# Patient Record
Sex: Female | Born: 1960 | Race: White | Hispanic: No | Smoking: Current every day smoker
Health system: Southern US, Community
[De-identification: ages and names within clinical notes are randomized; demographics above are authoritative.]

## PROBLEM LIST (undated history)

## (undated) ENCOUNTER — Inpatient Hospital Stay: Admission: EM | Payer: Self-pay

## (undated) DIAGNOSIS — K219 Gastro-esophageal reflux disease without esophagitis: Secondary | ICD-10-CM

## (undated) DIAGNOSIS — Z860100 Personal history of colon polyps, unspecified: Secondary | ICD-10-CM

## (undated) DIAGNOSIS — E079 Disorder of thyroid, unspecified: Secondary | ICD-10-CM

## (undated) DIAGNOSIS — R17 Unspecified jaundice: Secondary | ICD-10-CM

## (undated) DIAGNOSIS — M1990 Unspecified osteoarthritis, unspecified site: Secondary | ICD-10-CM

## (undated) DIAGNOSIS — L661 Lichen planopilaris, unspecified: Secondary | ICD-10-CM

## (undated) DIAGNOSIS — B019 Varicella without complication: Secondary | ICD-10-CM

## (undated) DIAGNOSIS — R519 Headache, unspecified: Secondary | ICD-10-CM

## (undated) DIAGNOSIS — T7840XA Allergy, unspecified, initial encounter: Secondary | ICD-10-CM

## (undated) DIAGNOSIS — F419 Anxiety disorder, unspecified: Secondary | ICD-10-CM

## (undated) DIAGNOSIS — R32 Unspecified urinary incontinence: Secondary | ICD-10-CM

## (undated) DIAGNOSIS — K5792 Diverticulitis of intestine, part unspecified, without perforation or abscess without bleeding: Secondary | ICD-10-CM

## (undated) DIAGNOSIS — F32A Depression, unspecified: Secondary | ICD-10-CM

## (undated) DIAGNOSIS — J439 Emphysema, unspecified: Secondary | ICD-10-CM

## (undated) HISTORY — DX: Depression, unspecified: F32.A

## (undated) HISTORY — DX: Gastro-esophageal reflux disease without esophagitis: K21.9

## (undated) HISTORY — DX: Varicella without complication: B01.9

## (undated) HISTORY — DX: Unspecified jaundice: R17

## (undated) HISTORY — DX: Anxiety disorder, unspecified: F41.9

## (undated) HISTORY — DX: Headache, unspecified: R51.9

## (undated) HISTORY — DX: Lichen planopilaris, unspecified: L66.10

## (undated) HISTORY — DX: Personal history of colon polyps, unspecified: Z86.0100

## (undated) HISTORY — DX: Allergy, unspecified, initial encounter: T78.40XA

## (undated) HISTORY — DX: Unspecified urinary incontinence: R32

## (undated) HISTORY — DX: Diverticulitis of intestine, part unspecified, without perforation or abscess without bleeding: K57.92

## (undated) HISTORY — DX: Emphysema, unspecified: J43.9

## (undated) HISTORY — DX: Unspecified osteoarthritis, unspecified site: M19.90

## (undated) HISTORY — DX: Lichen planopilaris: L66.1

## (undated) HISTORY — DX: Personal history of colonic polyps: Z86.010

## (undated) HISTORY — DX: Disorder of thyroid, unspecified: E07.9

---

## 2013-08-09 HISTORY — PX: COLONOSCOPY: SHX174

## 2013-08-09 HISTORY — PX: ESOPHAGOGASTRODUODENOSCOPY: SHX1529

## 2016-12-02 HISTORY — PX: COLONOSCOPY: SHX174

## 2020-02-05 DIAGNOSIS — M5430 Sciatica, unspecified side: Secondary | ICD-10-CM | POA: Insufficient documentation

## 2020-02-05 DIAGNOSIS — M1990 Unspecified osteoarthritis, unspecified site: Secondary | ICD-10-CM | POA: Insufficient documentation

## 2020-06-04 DIAGNOSIS — L661 Lichen planopilaris, unspecified: Secondary | ICD-10-CM | POA: Insufficient documentation

## 2020-11-29 ENCOUNTER — Telehealth: Payer: Self-pay | Admitting: Family Medicine

## 2020-11-29 NOTE — Telephone Encounter (Signed)
Patient wants to become a new patient of [name], she was informed that [name] is not taking new patients at the moment. She states Dr. Patsy Lager agreed to take her on as a patient during a OV with her mom Amanda Maynard. Please advice.

## 2020-11-29 NOTE — Telephone Encounter (Signed)
Are you okay with accepting this pt?

## 2020-11-30 NOTE — Telephone Encounter (Signed)
Okay to accept.

## 2020-12-01 NOTE — Telephone Encounter (Signed)
NP appointment was made.

## 2020-12-12 NOTE — Progress Notes (Addendum)
[facility] [address] [place], [place] [address] [phone]  Date:  12/13/2020   Name:  Amanda Maynard   DOB:  [birth year]   MRN:  [id number]  PCP:  Pearline Cables, MD    Chief Complaint: New Patient (Initial Visit) (Concerns/ questions: hair loss, nail issues, last CPE in June in [facility] with labs- she says was gets lightning feelings since her Levothyroxine dose has been altered. /)   History of Present Illness:  Amanda Maynard is a 60 y.o. very pleasant female patient who presents with the following:  Seen today as a new patient to establish care-daughter of an established patient of mine, Michon Kaczmarek- she moved to this area to take care of her mother.  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  She is considering herself to be retired  She did get a pension and is waiting on her SS right now   She got divorced in the last year as well No children She enjoys gardening, swimming, hiking and outdoor activities   Her thyroid dose was changed over the summer from 82 to 75 mcg- she is not sure if this made any difference in her overall health. She is doing some "natural stuff" as well, and is doing a Engineer, drilling from a nutritionist - " metabolic reset"  She notes she may get some "tingles" like electric shocks on occasion- she first noted these a few years ago, they seem to occur randomly and infrequently.  She wondered if it might have something to do with her thyroid but changing the dose has not made a difference  She was treated for hep C and cured- she was treated about 6 years ago and her titers have  been negative   She has seen hematology in the past for iron def and low platelets - low platelets thought due to her hep c treatment but otherwise benign  Last pap- she is s/p hyst done for fibroids, benign disease.  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  Pt declines flu shot today  Encouraged shingrix   Patient Active Problem List   Diagnosis Date Noted   Hepatitis C antibody test positive 12/13/2020   Hypothyroidism (acquired) 12/13/2020     Past Medical History:  Diagnosis Date   Allergy    Arthritis    Chicken pox    Depression    Diverticulitis    Emphysema of lung (HCC)    GERD (gastroesophageal reflux disease)    Headache    History of colon polyps    Jaundice    Lichen planopilaris    Thyroid disease    Urinary incontinence         Family History  Problem Relation Age of Onset   Heart disease Mother    Hearing loss Mother    Depression Mother    Cancer Mother    Arthritis Mother    Cancer Father    Hyperlipidemia Father    Heart disease  Maternal Grandmother    Cancer Maternal Grandfather    Arthritis Paternal Grandmother    Cancer Paternal Grandfather    Heart attack Paternal Grandfather     Allergies  Allergen Reactions   Molds & Smuts Shortness Of Breath   Sertraline    Sulfa Antibiotics Itching, Nausea Only, Rash and Nausea And Vomiting    Medication list has been reviewed and updated.  Current Outpatient Medications on File Prior to Visit  Medication Sig Dispense Refill   escitalopram (LEXAPRO) 5 mg TABS tablet Every other night.     SYNTHROID 75 MCG tablet Take 75 mcg by mouth daily.     UNABLE TO FIND Med Name: Metabolic Spark. 2 caps daily.     UNABLE TO FIND Med Name: Super Omega. 1 tbls daily.     UNABLE TO FIND Med Name: Metabolic Protein Shake. 1 scoop with water daily.     No current facility-administered medications on file prior to visit.    Review of  Systems:  As per HPI- otherwise negative.   Physical Examination: Vitals:   12/13/20 1119  BP: 110/60  Pulse: 70  Resp: 18  Temp: 97.9 F (36.6 C)  SpO2: 97%   Vitals:   12/13/20 1119  Weight: 195 lb 6.4 oz (88.6 kg)  Height: 5\' 3"  (1.6 m)   Body mass index is 34.61 kg/m. Ideal Body Weight: Weight in (lb) to have BMI = 25: 140.8  GEN: no acute distress.  Obese, otherwise looks well HEENT: Atraumatic, Normocephalic.  Ears and Nose: No external deformity. CV: RRR, No M/G/R. No JVD. No thrill. No extra heart sounds. PULM: CTA B, no wheezes, crackles, rhonchi. No retractions. No resp. distress. No accessory muscle use. ABD: S, NT, ND, +BS. No rebound. No HSM. EXTR: No c/c/e PSYCH: Normally interactive. Conversant.  She has significant hair loss along the frontal portion of her scalp.  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 She had lab work done in in June, this will be abstracted and added to her chart Her thyroid dose was adjusted in June, will check TSH today Order mammogram First dose Shingrix Referral to dermatology for lichen planopilaris And a visit we plan to follow-up for a physical exam in 4 to 6 months  Signed July, MD  Received labs as below, message to patient  Results for orders placed or performed in visit on 12/13/20  TSH  Result Value Ref Range   TSH 0.55 0.35 - 5.50 uIU/mL

## 2020-12-13 ENCOUNTER — Other Ambulatory Visit: Payer: Self-pay

## 2020-12-13 ENCOUNTER — Ambulatory Visit: Payer: BLUE CROSS/BLUE SHIELD | Admitting: Family Medicine

## 2020-12-13 ENCOUNTER — Encounter: Payer: Self-pay | Admitting: Family Medicine

## 2020-12-13 VITALS — BP 110/60 | HR 70 | Temp 97.9°F | Resp 18 | Ht 63.0 in | Wt 195.4 lb

## 2020-12-13 DIAGNOSIS — L661 Lichen planopilaris: Secondary | ICD-10-CM

## 2020-12-13 DIAGNOSIS — Z1231 Encounter for screening mammogram for malignant neoplasm of breast: Secondary | ICD-10-CM

## 2020-12-13 DIAGNOSIS — Z23 Encounter for immunization: Secondary | ICD-10-CM

## 2020-12-13 DIAGNOSIS — R768 Other specified abnormal immunological findings in serum: Secondary | ICD-10-CM | POA: Diagnosis not present

## 2020-12-13 DIAGNOSIS — E039 Hypothyroidism, unspecified: Secondary | ICD-10-CM | POA: Diagnosis not present

## 2020-12-13 LAB — HEPATIC FUNCTION PANEL
ALT: 16 (ref 7–35)
AST: 20 (ref 13–35)
Alkaline Phosphatase: 74 (ref 25–125)
Bilirubin, Total: 1

## 2020-12-13 LAB — BASIC METABOLIC PANEL
BUN: 11 (ref 4–21)
CO2: 24 — AB (ref 13–22)
Chloride: 104 (ref 99–108)
Creatinine: 0.9 (ref 0.5–1.1)
Glucose: 90
Potassium: 4.6 (ref 3.4–5.3)
Sodium: 142 (ref 137–147)

## 2020-12-13 LAB — COMPREHENSIVE METABOLIC PANEL
Albumin: 4.6 (ref 3.5–5.0)
Calcium: 9.5 (ref 8.7–10.7)
GFR calc non Af Amer: 78

## 2020-12-13 LAB — IRON,TIBC AND FERRITIN PANEL
Ferritin: 85
Iron: 73

## 2020-12-13 LAB — LIPID PANEL
Cholesterol: 187 (ref 0–200)
HDL: 49 (ref 35–70)
LDL Cholesterol: 121
Triglycerides: 91 (ref 40–160)

## 2020-12-13 LAB — CBC AND DIFFERENTIAL
HCT: 39 (ref 36–46)
Hemoglobin: 12.9 (ref 12.0–16.0)
Platelets: 92 — AB (ref 150–399)
WBC: 3.6

## 2020-12-13 LAB — TSH
TSH: 0.55 u[IU]/mL (ref 0.35–5.50)
TSH: 1.15 (ref 0.41–5.90)

## 2020-12-13 NOTE — Patient Instructions (Signed)
Good to meet you today! I will be in touch with your thyroid level asap First dose of shingrix given today Perhaps schedule a complete physical with me and we can do your 2nd shingles vaccine in 4-6 months  Mammogram ordered also

## 2021-01-16 ENCOUNTER — Encounter (HOSPITAL_BASED_OUTPATIENT_CLINIC_OR_DEPARTMENT_OTHER): Payer: Self-pay

## 2021-01-16 ENCOUNTER — Other Ambulatory Visit: Payer: Self-pay

## 2021-01-16 ENCOUNTER — Ambulatory Visit (HOSPITAL_BASED_OUTPATIENT_CLINIC_OR_DEPARTMENT_OTHER)
Admission: RE | Admit: 2021-01-16 | Discharge: 2021-01-16 | Disposition: A | Discharge status: Home / Self Care | Payer: BLUE CROSS/BLUE SHIELD | Source: Ambulatory Visit | Attending: Family Medicine | Admitting: Family Medicine

## 2021-01-16 DIAGNOSIS — Z1231 Encounter for screening mammogram for malignant neoplasm of breast: Secondary | ICD-10-CM | POA: Insufficient documentation

## 2021-04-12 ENCOUNTER — Encounter: Payer: BLUE CROSS/BLUE SHIELD | Admitting: Family Medicine

## 2021-04-17 NOTE — Progress Notes (Signed)
[facility] ?[address], [address] ?[place], [place] [address] ?336 [phone] ?Fax [phone] ? ?Date:  04/18/2021  ? ?Name:  Amanda Maynard   DOB:  December 30, 1960   MRN:  [id number] ? ?PCP:  Pearline Cables, MD  ? ? ?Chief Complaint: Annual Exam ? ? ?History of Present Illness: ? ?Amanda Maynard is a 61 y.o. very pleasant female patient who presents with the following: ? ?Patient seen today for physical exam ?Most recent visit with myself was in November to establish care ?She is the daughter of a longer term patient of mine, moved to this area from [facility] to care for her mom ?History of hypothyroidism previous history of hepatitis C status post curative treatment ? ?She is retired from her work in the [employer].  Divorced, no children.  She enjoys the outdoors, gardening and hiking- she is excited to start her garden this year, she plans to build some raised beds  ? ?Tetanus booster- she would like to to today  ?Status post hysterectomy for benign fibroids, can discontinue Pap screening ?Shingles vaccine, can get second dose of Shingrix ?Patient had primary COVID series and has not been interested in more doses ?Colon cancer screening- she did a colonoscopy 3 years ago and has a history of polyps.  She was told she needed a 3 year follow-up ?Pt has noted some GERD - she might like to do an upper GI as well  ? ?Lexapro 5 mg every other day- she actually increased to daily due to some depression symptoms.  She denies any suicidal ideation.  She notes that the daily Lexapro has improved her symptoms, she feels like she is managing okay ?Levothyroxine 75 ? ?Had lab work was abstracted in November, low platelets ?Mammogram is up-to-date ?Bone density ? ?Pt notes she will get "shooting pains" in her lower back which go up to her neck on occasion ?This may occur every couple of weeks ?It lasts for just a moment- she will feel really stiff and like she can't move ?It may last  30 seconds or so ?She is not aware of any injury or other trigger ?Pain is not going down her legs ? ?Currently, she notes diagnosis of lichen planus which is because nail changes and significant hair loss.  A friend of hers who was a Engineer, civil (consulting) in the [employer] suggested using griseofulvin.  I advised her I do not think this will be helpful for lichen planus.  She does have a dermatology appointment coming up in the next 1 to 2 months ?Patient Active Problem List  ? Diagnosis Date Noted  ? Hepatitis C antibody test positive 12/13/2020  ? Hypothyroidism (acquired) 12/13/2020  ? ? ?Past Medical History:  ?Diagnosis Date  ? Allergy   ? Anxiety   ? Arthritis   ? Chicken pox   ? Depression   ? Diverticulitis   ? Emphysema of lung (HCC)   ? GERD (gastroesophageal reflux disease)   ? Headache   ? History of colon polyps   ? Jaundice   ? Lichen planopilaris   ? Thyroid disease   ? Urinary incontinence   ? ? ?Past Surgical History:  ?Procedure Laterality Date  ? ABDOMINAL HYSTERECTOMY  2012  ? APPENDECTOMY  2009  ? ? ?Social History  ? ?Tobacco Use  ? Smoking status: Every Day  ?  Packs/day: 0.50  ?  Years: 30.00  ?  Pack years: 15.00  ?  Types: Cigarettes  ? Tobacco comments:  ?  Have quit twice in 30 years but start again when stress level is too high  ?Substance Use Topics  ? Alcohol use: Yes  ?  Alcohol/week: 6.0 standard drinks  ?  Types: 3 Cans of beer, 3 Standard drinks or equivalent per week  ? Drug use: Never  ? ? ?Family History  ?Problem Relation Age of Onset  ? Breast cancer Mother [id number]  ? Heart disease Mother   ? Hearing loss Mother   ? Depression Mother   ? Cancer Mother   ? Arthritis Mother   ? Cancer Father   ? Hyperlipidemia Father   ? Heart disease Maternal Grandmother   ? Cancer Maternal Grandfather   ? Arthritis Paternal Grandmother   ? Cancer Paternal Grandfather   ? Heart attack Paternal Grandfather   ? ? ?Allergies  ?Allergen Reactions  ? Molds & Smuts Shortness Of Breath  ? Sertraline   ? Sulfa Antibiotics  Itching, Nausea Only, Rash and Nausea And Vomiting  ? ? ?Medication list has been reviewed and updated. ? ?Current Outpatient Medications on File Prior to Visit  ?Medication Sig Dispense Refill  ? escitalopram (LEXAPRO) 5 mg TABS tablet Every other night.    ? SYNTHROID 75 MCG tablet Take 75 mcg by mouth daily.    ? UNABLE TO FIND Med Name: Metabolic Spark. 2 caps daily.    ? UNABLE TO FIND Med Name: Super Omega. 1 tbls daily.    ? UNABLE TO FIND Med Name: Metabolic Protein Shake. 1 scoop with water daily.    ? ?No current facility-administered medications on file prior to visit.  ? ? ?Review of Systems: ? ?As per HPI- otherwise negative. ? ? ?Physical Examination: ?Vitals:  ? 04/18/21 0911  ?BP: 124/78  ?Pulse: 81  ?Resp: (!) 22  ?SpO2: 95%  ? ?Vitals:  ? 04/18/21 0911  ?Weight: 196 lb (88.9 kg)  ?Height: 5\' 3"  (1.6 m)  ? ?Body mass index is 34.72 kg/m?. ?Ideal Body Weight: Weight in (lb) to have BMI = 25: 140.8 ? ?GEN: no acute distress.  Obese, otherwise looks well ?HEENT: Atraumatic, Normocephalic.  Bilateral TM wnl, oropharynx normal.  PEERL,EOMI.   ?Ears and Nose: No external deformity. ?CV: RRR, No M/G/R. No JVD. No thrill. No extra heart sounds. ?PULM: CTA B, no wheezes, crackles, rhonchi. No retractions. No resp. distress. No accessory muscle use. ?ABD: S, NT, ND, +BS. No rebound. No HSM. ?EXTR: No c/c/e ?PSYCH: Normally interactive. Conversant.  ?She has changes of a few fingernails consistent with lichen planus.  Significant hair loss with smooth, shiny scalp on the crown of her head ?Her paraspinous musculature is not tender to palpation at this time ?Assessment and Plan: ?Encounter for annual physical exam ? ?Encounter for screening colonoscopy - Plan: Ambulatory referral to Gastroenterology ? ?Preventative health care - Plan: Ambulatory referral to Gastroenterology ? ?Need for shingles vaccine - Plan: Varicella-zoster vaccine IM (Shingrix) ? ?Physical exam - Plan: Lipid panel ? ?Screening for  hyperlipidemia - Plan: Lipid panel ? ?Hypothyroidism (acquired) - Plan: TSH ? ?Screening for diabetes mellitus - Plan: Comprehensive metabolic panel, Hemoglobin A1c ? ?Midline low back pain without sciatica, unspecified chronicity - Plan: DG Lumbar Spine Complete ? ?Screening, deficiency anemia, iron - Plan: CBC ? ?Immunization due - Plan: Tdap vaccine greater than or equal to 7yo IM ? ?Physical exam today.  Encouraged healthy diet and exercise routine ?Will plan further follow- up pending labs. ? ?Give second  dose of Shingrix, tetanus shot today ?We will obtain lumbar spine films for concern ofVery occasional pains in her low back ?Will plan further follow- up pending labs and x-rays. ?Referral to gastroenterology for screening colonoscopy and possibly upper GI ?Signed ?Abbe Amsterdam, MD ? ?Received labs as below, message to pt ? ?Results for orders placed or performed in visit on 04/18/21  ?CBC  ?Result Value Ref Range  ? WBC 3.7 (L) 4.0 - 10.5 K/uL  ? RBC 4.33 3.87 - 5.11 Mil/uL  ? Platelets 82.0 (L) 150.0 - 400.0 K/uL  ? Hemoglobin 12.4 12.0 - 15.0 g/dL  ? HCT 37.4 36.0 - 46.0 %  ? MCV 86.5 78.0 - 100.0 fl  ? MCHC 33.1 30.0 - 36.0 g/dL  ? RDW 14.2 11.5 - 15.5 %  ?Comprehensive metabolic panel  ?Result Value Ref Range  ? Sodium 141 135 - 145 mEq/L  ? Potassium 4.4 3.5 - 5.1 mEq/L  ? Chloride 106 96 - 112 mEq/L  ? CO2 29 19 - 32 mEq/L  ? Glucose, Bld 93 70 - 99 mg/dL  ? BUN 11 6 - 23 mg/dL  ? Creatinine, Ser 0.74 0.40 - 1.20 mg/dL  ? Total Bilirubin 1.0 0.2 - 1.2 mg/dL  ? Alkaline Phosphatase 66 39 - 117 U/L  ? AST 18 0 - 37 U/L  ? ALT 12 0 - 35 U/L  ? Total Protein 6.9 6.0 - 8.3 g/dL  ? Albumin 4.3 3.5 - 5.2 g/dL  ? GFR 87.43 >60.00 mL/min  ? Calcium 9.4 8.4 - 10.5 mg/dL  ?Hemoglobin A1c  ?Result Value Ref Range  ? Hgb A1c MFr Bld 5.3 4.6 - 6.5 %  ?Lipid panel  ?Result Value Ref Range  ? Cholesterol 180 0 - 200 mg/dL  ? Triglycerides 67.0 0.0 - 149.0 mg/dL  ? HDL 53.30 >39.00 mg/dL  ? VLDL 13.4 0.0 - 40.0 mg/dL   ? LDL Cholesterol 114 (H) 0 - 99 mg/dL  ? Total CHOL/HDL Ratio 3   ? NonHDL 126.94   ?TSH  ?Result Value Ref Range  ? TSH 0.95 0.35 - 5.50 uIU/mL  ? ? ? ?

## 2021-04-17 NOTE — Patient Instructions (Addendum)
It was good to see you again today. ?I will be in touch with your labs asap ?Tetanus and 2nd dose of shingrix given today- you may feel a bit achy for a day or so, this is normal.  OTC pain relievers ok!  ? ?Referral placed to GI  ?

## 2021-04-18 ENCOUNTER — Ambulatory Visit (HOSPITAL_BASED_OUTPATIENT_CLINIC_OR_DEPARTMENT_OTHER)
Admission: RE | Admit: 2021-04-18 | Discharge: 2021-04-18 | Disposition: A | Discharge status: Home / Self Care | Payer: BLUE CROSS/BLUE SHIELD | Source: Ambulatory Visit | Attending: Family Medicine | Admitting: Family Medicine

## 2021-04-18 ENCOUNTER — Encounter: Payer: Self-pay | Admitting: Family Medicine

## 2021-04-18 ENCOUNTER — Other Ambulatory Visit: Payer: Self-pay

## 2021-04-18 ENCOUNTER — Ambulatory Visit (INDEPENDENT_AMBULATORY_CARE_PROVIDER_SITE_OTHER): Payer: BLUE CROSS/BLUE SHIELD | Admitting: Family Medicine

## 2021-04-18 VITALS — BP 124/78 | HR 81 | Resp 22 | Ht 63.0 in | Wt 196.0 lb

## 2021-04-18 DIAGNOSIS — Z131 Encounter for screening for diabetes mellitus: Secondary | ICD-10-CM

## 2021-04-18 DIAGNOSIS — Z130 Encounter for screening for diseases of the blood and blood-forming organs and certain disorders involving the immune mechanism: Secondary | ICD-10-CM

## 2021-04-18 DIAGNOSIS — M545 Low back pain, unspecified: Secondary | ICD-10-CM | POA: Diagnosis not present

## 2021-04-18 DIAGNOSIS — Z1211 Encounter for screening for malignant neoplasm of colon: Secondary | ICD-10-CM

## 2021-04-18 DIAGNOSIS — Z0000 Encounter for general adult medical examination without abnormal findings: Secondary | ICD-10-CM | POA: Diagnosis not present

## 2021-04-18 DIAGNOSIS — Z23 Encounter for immunization: Secondary | ICD-10-CM | POA: Diagnosis not present

## 2021-04-18 DIAGNOSIS — E039 Hypothyroidism, unspecified: Secondary | ICD-10-CM | POA: Diagnosis not present

## 2021-04-18 DIAGNOSIS — Z13220 Encounter for screening for lipoid disorders: Secondary | ICD-10-CM | POA: Diagnosis not present

## 2021-04-18 LAB — CBC
HCT: 37.4 % (ref 36.0–46.0)
Hemoglobin: 12.4 g/dL (ref 12.0–15.0)
MCHC: 33.1 g/dL (ref 30.0–36.0)
MCV: 86.5 fl (ref 78.0–100.0)
Platelets: 82 10*3/uL — ABNORMAL LOW (ref 150.0–400.0)
RBC: 4.33 Mil/uL (ref 3.87–5.11)
RDW: 14.2 % (ref 11.5–15.5)
WBC: 3.7 10*3/uL — ABNORMAL LOW (ref 4.0–10.5)

## 2021-04-18 LAB — COMPREHENSIVE METABOLIC PANEL
ALT: 12 U/L (ref 0–35)
AST: 18 U/L (ref 0–37)
Albumin: 4.3 g/dL (ref 3.5–5.2)
Alkaline Phosphatase: 66 U/L (ref 39–117)
BUN: 11 mg/dL (ref 6–23)
CO2: 29 mEq/L (ref 19–32)
Calcium: 9.4 mg/dL (ref 8.4–10.5)
Chloride: 106 mEq/L (ref 96–112)
Creatinine, Ser: 0.74 mg/dL (ref 0.40–1.20)
GFR: 87.43 mL/min (ref >60.00)
Glucose, Bld: 93 mg/dL (ref 70–99)
Potassium: 4.4 mEq/L (ref 3.5–5.1)
Sodium: 141 mEq/L (ref 135–145)
Total Bilirubin: 1 mg/dL (ref 0.2–1.2)
Total Protein: 6.9 g/dL (ref 6.0–8.3)

## 2021-04-18 LAB — TSH: TSH: 0.95 u[IU]/mL (ref 0.35–5.50)

## 2021-04-18 LAB — LIPID PANEL
Cholesterol: 180 mg/dL (ref 0–200)
HDL: 53.3 mg/dL (ref >39.00)
LDL Cholesterol: 114 mg/dL — ABNORMAL HIGH (ref 0–99)
NonHDL: 126.94
Total CHOL/HDL Ratio: 3
Triglycerides: 67 mg/dL (ref 0.0–149.0)
VLDL: 13.4 mg/dL (ref 0.0–40.0)

## 2021-04-18 LAB — HEMOGLOBIN A1C: Hgb A1c MFr Bld: 5.3 % (ref 4.6–6.5)

## 2021-04-20 ENCOUNTER — Encounter: Payer: Self-pay | Admitting: Family Medicine

## 2021-04-23 ENCOUNTER — Other Ambulatory Visit: Payer: Self-pay | Admitting: Family

## 2021-04-23 DIAGNOSIS — D696 Thrombocytopenia, unspecified: Secondary | ICD-10-CM

## 2021-04-24 ENCOUNTER — Inpatient Hospital Stay: Payer: BLUE CROSS/BLUE SHIELD | Admitting: Family

## 2021-04-24 ENCOUNTER — Inpatient Hospital Stay: Payer: BLUE CROSS/BLUE SHIELD | Attending: Hematology & Oncology

## 2021-04-24 ENCOUNTER — Other Ambulatory Visit: Payer: Self-pay

## 2021-04-24 ENCOUNTER — Encounter: Payer: Self-pay | Admitting: Family

## 2021-04-24 VITALS — BP 98/50 | HR 70 | Temp 98.2°F | Resp 18 | Ht 63.0 in | Wt 195.8 lb

## 2021-04-24 DIAGNOSIS — D696 Thrombocytopenia, unspecified: Secondary | ICD-10-CM | POA: Diagnosis not present

## 2021-04-24 DIAGNOSIS — F17210 Nicotine dependence, cigarettes, uncomplicated: Secondary | ICD-10-CM

## 2021-04-24 DIAGNOSIS — Z90710 Acquired absence of both cervix and uterus: Secondary | ICD-10-CM | POA: Diagnosis not present

## 2021-04-24 DIAGNOSIS — D72819 Decreased white blood cell count, unspecified: Secondary | ICD-10-CM | POA: Insufficient documentation

## 2021-04-24 DIAGNOSIS — Z803 Family history of malignant neoplasm of breast: Secondary | ICD-10-CM | POA: Diagnosis not present

## 2021-04-24 LAB — CBC WITH DIFFERENTIAL (CANCER CENTER ONLY)
Abs Immature Granulocytes: 0.01 10*3/uL (ref 0.00–0.07)
Basophils Absolute: 0 10*3/uL (ref 0.0–0.1)
Basophils Relative: 1 %
Eosinophils Absolute: 0.1 10*3/uL (ref 0.0–0.5)
Eosinophils Relative: 3 %
HCT: 39.1 % (ref 36.0–46.0)
Hemoglobin: 12.8 g/dL (ref 12.0–15.0)
Immature Granulocytes: 0 %
Lymphocytes Relative: 21 %
Lymphs Abs: 0.8 10*3/uL (ref 0.7–4.0)
MCH: 28.6 pg (ref 26.0–34.0)
MCHC: 32.7 g/dL (ref 30.0–36.0)
MCV: 87.3 fL (ref 80.0–100.0)
Monocytes Absolute: 0.2 10*3/uL (ref 0.1–1.0)
Monocytes Relative: 6 %
Neutro Abs: 2.7 10*3/uL (ref 1.7–7.7)
Neutrophils Relative %: 69 %
Platelet Count: 99 10*3/uL — ABNORMAL LOW (ref 150–400)
RBC: 4.48 MIL/uL (ref 3.87–5.11)
RDW: 13.8 % (ref 11.5–15.5)
WBC Count: 3.9 10*3/uL — ABNORMAL LOW (ref 4.0–10.5)
nRBC: 0 % (ref 0.0–0.2)

## 2021-04-24 LAB — LACTATE DEHYDROGENASE: LDH: 137 U/L (ref 98–192)

## 2021-04-24 LAB — CMP (CANCER CENTER ONLY)
ALT: 12 U/L (ref 0–44)
AST: 19 U/L (ref 15–41)
Albumin: 4.6 g/dL (ref 3.5–5.0)
Alkaline Phosphatase: 67 U/L (ref 38–126)
Anion gap: 7 (ref 5–15)
BUN: 12 mg/dL (ref 8–23)
CO2: 27 mmol/L (ref 22–32)
Calcium: 10.2 mg/dL (ref 8.9–10.3)
Chloride: 106 mmol/L (ref 98–111)
Creatinine: 0.76 mg/dL (ref 0.44–1.00)
GFR, Estimated: >60 mL/min (ref >60)
Glucose, Bld: 98 mg/dL (ref 70–99)
Potassium: 4.1 mmol/L (ref 3.5–5.1)
Sodium: 140 mmol/L (ref 135–145)
Total Bilirubin: 1.4 mg/dL — ABNORMAL HIGH (ref 0.3–1.2)
Total Protein: 7.7 g/dL (ref 6.5–8.1)

## 2021-04-24 LAB — SAVE SMEAR(SSMR), FOR PROVIDER SLIDE REVIEW

## 2021-04-24 NOTE — Progress Notes (Signed)
Hematology/Oncology Consultation  ? ?Name: Amanda Maynard      MRN: [id number]    Location: Room/bed info not found  Date: 04/24/2021 Time:11:16 AM ? ? ?REFERRING PHYSICIAN: Abbe AmsterdamJessica [name], MD ? ?REASON FOR CONSULT: Thrombocytopenia  ?  ?DIAGNOSIS: Thrombocytopenia  ? ?HISTORY OF PRESENT ILLNESS: Amanda Maynard is a very pleasant 61 yo caucasian female with at least a 3 year known history of mild thrombocytopenia and intermittent mild leukopenia.  ?Platelets today are 99 and WBC count 3.9.  ?She denies any issues with bleeding. No abnormal bruising or petechiae.  ?She is symptomatic with fatigue and dizziness when she first gets up.  ?She has history of Hep C and was treated with Mavyret in 2018. She states that she is now in remission. She does not remember ever having scans of the abdomen to assess her liver and spleen.  ?She states that she drank heavily in her 30-40's. She now has a couple beers, margarita or martini every other day or so.  ?She smokes 1/2 ppd and is slowly weaning down to quit. [place] in [place] doing this with her as well.  ?She has had no issue with recurrent or persistent infections.  ?She and her mother did have Covid in February but have both recuperated from this.  ?No fever, chills, cough, SOB, chest pain, palpitations, abdominal pain or changes in bowel or bladder habits.  ?She has occasional nausea without vomiting and abdominal bloating that comes and goes.  ?She has Lichen Planopilaris that has effected finger and toe nails as well as caused loss of large patches of hair on her head.  ?No personal history of cancer. Her mother had breast, father had soft palate, tongue and lung. Her paternal grandfather had lung and maternal grandfather had metastatic cancer with unknown primary.  ?No history of pregnancy or miscarriage.  ?She has had an appendectomy and hysterectomy without any complications.  ?She has hypothyroidism and is on synthroid.  ?No history of diabetes.  ?No  swelling, tenderness, numbness or tingling in her extremities at this time.  ?No falls or syncope.  ?She has maintained a good appetite and is doing her best to stay well hydrated. Her weight is stable at 195 lbs.  ?She stays quite busy taking care of her mother and spending time with her dog Merlin.  ? ?ROS: All other 10 point review of systems is negative.  ? ?PAST MEDICAL HISTORY:   ?Past Medical History:  ?Diagnosis Date  ? Allergy   ? Anxiety   ? Arthritis   ? Chicken pox   ? Depression   ? Diverticulitis   ? Emphysema of lung (HCC)   ? GERD (gastroesophageal reflux disease)   ? Headache   ? History of colon polyps   ? Jaundice   ? Lichen planopilaris   ? Thyroid disease   ? Urinary incontinence   ? ? ?ALLERGIES: ?Allergies  ?Allergen Reactions  ? Molds & Smuts Shortness Of Breath  ? Sertraline   ? Sulfa Antibiotics Itching, Nausea Only, Rash and Nausea And Vomiting  ? ?   ?MEDICATIONS:  ?Current Outpatient Medications on File Prior to Visit  ?Medication Sig Dispense Refill  ? escitalopram (LEXAPRO) 5 mg TABS tablet Every other night.    ? SYNTHROID 75 MCG tablet Take 75 mcg by mouth daily.    ? UNABLE TO FIND Med Name: Super Omega. 1 tbls daily.    ? UNABLE TO FIND Med Name: Metabolic Protein Shake. 1 scoop with  water daily.    ? UNABLE TO FIND Med Name: Metabolic Spark. 2 caps daily. (Patient not taking: Reported on 04/24/2021)    ? ?No current facility-administered medications on file prior to visit.  ? ?  ?PAST SURGICAL HISTORY ?Past Surgical History:  ?Procedure Laterality Date  ? ABDOMINAL HYSTERECTOMY  2012  ? APPENDECTOMY  2009  ? ? ?FAMILY HISTORY: ?Family History  ?Problem Relation Age of Onset  ? Breast cancer Mother 50  ? Heart disease Mother   ? Hearing loss Mother   ? Depression Mother   ? Cancer Mother   ? Arthritis Mother   ? Cancer Father   ? Hyperlipidemia Father   ? Heart disease Maternal Grandmother   ? Cancer Maternal Grandfather   ? Arthritis Paternal Grandmother   ? Cancer Paternal  Grandfather   ? Heart attack Paternal Grandfather   ? ? ?SOCIAL HISTORY: ? reports that she has been smoking cigarettes. She has a 15.00 pack-year smoking history. She does not have any smokeless tobacco history on file. She reports current alcohol use of about 6.0 standard drinks per week. She reports that she does not use drugs. ? ?PERFORMANCE STATUS: ?The patient's performance status is 1 - Symptomatic but completely ambulatory ? ?PHYSICAL EXAM: ?Most Recent Vital Signs: Blood pressure (!) 98/50, pulse 70, temperature 98.2 ?F (36.8 ?C), temperature source Oral, resp. rate 18, height 5\' 3"  (1.6 m), weight 195 lb 12.8 oz (88.8 kg), SpO2 97 %. ?BP (!) 98/50 (BP Location: Left Arm, Patient Position: Sitting)   Pulse 70   Temp 98.2 ?F (36.8 ?C) (Oral)   Resp 18   Ht 5\' 3"  (1.6 m)   Wt 195 lb 12.8 oz (88.8 kg)   SpO2 97%   BMI 34.68 kg/m?  ? ?General Appearance:    Alert, cooperative, no distress, appears stated age  ?Head:    Normocephalic, without obvious abnormality, atraumatic  ?Eyes:    PERRL, conjunctiva/corneas clear, EOM's intact, fundi  ?  benign, both eyes  ?   ?   ?Throat:   Lips, mucosa, and tongue normal; teeth and gums normal  ?Neck:   Supple, symmetrical, trachea midline, no adenopathy;  ?  thyroid:  no enlargement/tenderness/nodules; no carotid ?  bruit or JVD  ?Back:     Symmetric, no curvature, ROM normal, no CVA tenderness  ?Lungs:     Clear to auscultation bilaterally, respirations unlabored  ?Chest Wall:    No tenderness or deformity  ? Heart:    Regular rate and rhythm, S1 and S2 normal, no murmur, rub   or gallop  ?   ?Abdomen:     Soft, non-tender, bowel sounds active all four quadrants,  ?  no masses, no organomegaly  ?   ?   ?Extremities:   Extremities normal, atraumatic, no cyanosis or edema  ?Pulses:   2+ and symmetric all extremities  ?Skin:   Skin color, texture, turgor normal, no rashes or lesions  ?Lymph nodes:   Cervical, supraclavicular, and axillary nodes normal  ?Neurologic:    CNII-XII intact, normal strength, sensation and reflexes  ?  throughout  ? ? ?LABORATORY DATA:  ?Results for orders placed or performed in visit on 04/24/21 (from the past 48 hour(s))  ?CBC with Differential (Cancer Center Only)     Status: Abnormal  ? Collection Time: 04/24/21 10:47 AM  ?Result Value Ref Range  ? WBC Count 3.9 (L) 4.0 - 10.5 K/uL  ? RBC 4.48 3.87 - 5.11 MIL/uL  ?  Hemoglobin 12.8 12.0 - 15.0 g/dL  ? HCT 39.1 36.0 - 46.0 %  ? MCV 87.3 80.0 - 100.0 fL  ? MCH 28.6 26.0 - 34.0 pg  ? MCHC 32.7 30.0 - 36.0 g/dL  ? RDW 13.8 11.5 - 15.5 %  ? Platelet Count 99 (L) 150 - 400 K/uL  ? nRBC 0.0 0.0 - 0.2 %  ? Neutrophils Relative % 69 %  ? Neutro Abs 2.7 1.7 - 7.7 K/uL  ? Lymphocytes Relative 21 %  ? Lymphs Abs 0.8 0.7 - 4.0 K/uL  ? Monocytes Relative 6 %  ? Monocytes Absolute 0.2 0.1 - 1.0 K/uL  ? Eosinophils Relative 3 %  ? Eosinophils Absolute 0.1 0.0 - 0.5 K/uL  ? Basophils Relative 1 %  ? Basophils Absolute 0.0 0.0 - 0.1 K/uL  ? Immature Granulocytes 0 %  ? Abs Immature Granulocytes 0.01 0.00 - 0.07 K/uL  ?  Comment: Performed at [facility] at [facility], [address] [place], [place] [address]  ?Save Smear [facility])     Status: None  ? Collection Time: 04/24/21 10:47 AM  ?Result Value Ref Range  ? Smear Review SMEAR STAINED AND AVAILABLE FOR REVIEW   ?  Comment: Performed at [facility] at [facility], [address], [place], [place] [address]  ?   ? ?RADIOGRAPHY: ?No results found.   ?  ?PATHOLOGY: None ? ?ASSESSMENT/PLAN: Amanda Maynard is a very pleasant 61 yo caucasian female with at least a 3 year known history of mild thrombocytopenia and intermittent mild leukopenia. She has history of Hep C s/p treatment in 2018 and states that she is now in remission.  ?Blood smear reviewed with Dr. Myna Hidalgo. Platelets are large in size but otherwise unremarkable. WBC and BRC's also appear within normal limits. No evidence of malignancy noted.  ?We will get a  baseline abdominal US to assess her liver and spleen.  ?At this time we will follow-up as needed.  ?We discussed s/s of thrombocytopenia and leukopenia and she verbalized understanding.    ? ?All questions were answere

## 2021-04-25 ENCOUNTER — Telehealth: Payer: Self-pay | Admitting: *Deleted

## 2021-04-25 NOTE — Telephone Encounter (Signed)
Per 04/24/21 los - She will now follow-up as needed ?

## 2021-04-27 ENCOUNTER — Encounter: Payer: Self-pay | Admitting: Gastroenterology

## 2021-04-27 ENCOUNTER — Ambulatory Visit (INDEPENDENT_AMBULATORY_CARE_PROVIDER_SITE_OTHER): Payer: BLUE CROSS/BLUE SHIELD | Admitting: Gastroenterology

## 2021-04-27 ENCOUNTER — Ambulatory Visit: Payer: BLUE CROSS/BLUE SHIELD | Admitting: Gastroenterology

## 2021-04-27 ENCOUNTER — Other Ambulatory Visit: Payer: Self-pay

## 2021-04-27 ENCOUNTER — Other Ambulatory Visit (INDEPENDENT_AMBULATORY_CARE_PROVIDER_SITE_OTHER): Payer: BLUE CROSS/BLUE SHIELD

## 2021-04-27 VITALS — BP 120/70 | HR 72 | Ht 63.0 in | Wt 198.0 lb

## 2021-04-27 DIAGNOSIS — Z8619 Personal history of other infectious and parasitic diseases: Secondary | ICD-10-CM

## 2021-04-27 DIAGNOSIS — Z860100 Personal history of colon polyps, unspecified: Secondary | ICD-10-CM

## 2021-04-27 DIAGNOSIS — R17 Unspecified jaundice: Secondary | ICD-10-CM | POA: Diagnosis not present

## 2021-04-27 DIAGNOSIS — K219 Gastro-esophageal reflux disease without esophagitis: Secondary | ICD-10-CM

## 2021-04-27 MED ORDER — SUTAB 1479-225-188 MG PO TABS: 1.0000 | ORAL_TABLET | ORAL | 0 refills | Status: DC

## 2021-04-27 NOTE — Progress Notes (Signed)
? ? ?          ?Chief Complaint: GERD, history of colon polyps ? ? ?Referring Provider:     Pearline Cables, MD ? ? ?HPI:   ? ? ?Amanda Maynard is a 61 y.o. female with a history of HCV (treated with Mavyret in 2018 with SVR), tobacco use, hypothyroidism, anxiety, depression, emphysema, hysterectomy, appendectomy referred to the Gastroenterology Clinic for evaluation of GERD and colon polyp surveillance.  ? ?Has had GERD for at least 7 years. Index sxs of HB, regurgitation. No dysphagia. Worse with tomato/tomato-based sauces, onions, peppers. Has been trying to control with diet. More recently has been using her mother's Pepcid and OTC Tums prn, using at least 4 days/week. Has never tried daily therapy. EGD in 2016 in [place]. Requested records today.  ? ?Last colonoscopy was approximately 3 years ago in [place] and notable for polyps with recommendation repeat in 3 years.  No record available for review; requested records today. ? ?Recently moved from [facility] to care for her mother. ? ?Follows in the Hematology clinic for mild thrombocytopenia and intermittent mild leukopenia.  Most recent PLT 99 and WBC 3.9 with normal H/H.  T. bili 1.4, otherwise normal liver enzymes and ALP. ? ?History of HCV treated with Mavyret in 2018 with established SVR per patient.  Liver enzymes normal in 2022 and earlier this month (T. bili 1.4). ? ? ?Past Medical History:  ?Diagnosis Date  ? Allergy   ? Anxiety   ? Arthritis   ? Chicken pox   ? Depression   ? Diverticulitis   ? Emphysema of lung (HCC)   ? GERD (gastroesophageal reflux disease)   ? Headache   ? History of colon polyps   ? Jaundice   ? Lichen planopilaris   ? Thyroid disease   ? Urinary incontinence   ? ? ? ?Past Surgical History:  ?Procedure Laterality Date  ? ABDOMINAL HYSTERECTOMY  2012  ? APPENDECTOMY  2009  ? ?Family History  ?Problem Relation Age of Onset  ? Breast cancer Mother 76  ? Heart disease Mother   ? Hearing loss Mother   ? Depression Mother   ?  Cancer Mother   ? Arthritis Mother   ? Cancer Father   ? Hyperlipidemia Father   ? Heart disease Maternal Grandmother   ? Cancer Maternal Grandfather   ? Arthritis Paternal Grandmother   ? Cancer Paternal Grandfather   ? Heart attack Paternal Grandfather   ? ?Social History  ? ?Tobacco Use  ? Smoking status: Every Day  ?  Packs/day: 0.50  ?  Years: 30.00  ?  Pack years: 15.00  ?  Types: Cigarettes  ? Tobacco comments:  ?  Have quit twice in 30 years but start again when stress level is too high  ?Substance Use Topics  ? Alcohol use: Yes  ?  Alcohol/week: 6.0 standard drinks  ?  Types: 3 Cans of beer, 3 Standard drinks or equivalent per week  ? Drug use: Never  ? ?Current Outpatient Medications  ?Medication Sig Dispense Refill  ? escitalopram (LEXAPRO) 5 mg TABS tablet Every other night.    ? SYNTHROID 75 MCG tablet Take 75 mcg by mouth daily.    ? UNABLE TO FIND Med Name: Metabolic Spark. 2 caps daily.    ? UNABLE TO FIND Med Name: Super Omega. 1 tbls daily.    ? UNABLE TO FIND Med Name: Metabolic Protein Shake. 1 scoop with water daily.    ? ?  No current facility-administered medications for this visit.  ? ?Allergies  ?Allergen Reactions  ? Molds & Smuts Shortness Of Breath  ? Sertraline   ? Sulfa Antibiotics Itching, Nausea Only, Rash and Nausea And Vomiting  ? ? ? ?Review of Systems: ?All systems reviewed and negative except where noted in HPI.  ? ? ? ?Physical Exam:   ? ?Wt Readings from Last 3 Encounters:  ?04/27/21 198 lb (89.8 kg)  ?04/24/21 195 lb 12.8 oz (88.8 kg)  ?04/18/21 196 lb (88.9 kg)  ? ? ?BP 120/70   Pulse 72   Ht 5\' 3"  (1.6 m)   Wt 198 lb (89.8 kg)   BMI 35.07 kg/m?  ?Constitutional:  Pleasant, in no acute distress. ?Psychiatric: Normal mood and affect. Behavior is normal. ?Cardiovascular: Normal rate, regular rhythm. No edema ?Pulmonary/chest: Effort normal and breath sounds normal. No wheezing, rales or rhonchi. ?Abdominal: Soft, nondistended, nontender. Bowel sounds active throughout. There  are no masses palpable. No hepatomegaly. ?Neurological: Alert and oriented to person place and time. ?Skin: Skin is warm and dry. No rashes noted. ? ? ?ASSESSMENT AND PLAN;  ? ?1) History of colon polyps ?- Reported history of colon polyps with last colonoscopy in 2020, with recommendation repeat in 3 years ?- Requesting records from previous GI today ?- Repeat colonoscopy for ongoing polyp surveillance per prior recommendations ? ?2) GERD ?- Increasing reflux symptoms, and using her mother's Pepcid more frequently, along with Tums for breakthrough. ?- Discussed role/utility of repeat upper endoscopy vs starting high-dose PPI for diagnostic and therapeutic intent.  She strongly prefers endoscopy first prior to starting daily therapy ?- Schedule EGD ?- Depending on EGD findings, tentative plan for short course of high-dose PPI and titrate to effect ?- Continue antireflux lifestyle/dietary modifications with avoidance of exacerbating foods, avoid overeating, avoid eating close to bedtime ? ?3) Elevated bilirubin ?- Isolated mild elevation in T. bili with otherwise normal liver enzymes and normal ALP ?- Repeat LFT panel with bili fractionization ? ?4) History of HCV ?- Treated with Mavyret in 2018 with reported SVR ?- Recent liver enzymes otherwise normal ?- Requested prior records for review ? ?The indications, risks, and benefits of EGD and colonoscopy were explained to the patient in detail. Risks include but are not limited to bleeding, perforation, adverse reaction to medications, and cardiopulmonary compromise. Sequelae include but are not limited to the possibility of surgery, hospitalization, and mortality. The patient verbalized understanding and wished to proceed. All questions answered, referred to scheduler and bowel prep ordered. Further recommendations pending results of the exam.  ? ? ? ?2019, DO, FACG  04/27/2021, 2:33 PM ? ? ?[name], 04/29/2021, MD ? ?

## 2021-04-27 NOTE — Patient Instructions (Addendum)
If you are age 61 or older, your body mass index should be between 23-30. Your Body mass index is 35.07 kg/m?Marland Kitchen If this is out of the aforementioned range listed, please consider follow up with your Primary Care Provider. ? ?If you are age 68 or younger, your body mass index should be between 19-25. Your Body mass index is 35.07 kg/m?Marland Kitchen If this is out of the aformentioned range listed, please consider follow up with your Primary Care Provider.  ? ?________________________________________________________ ? ?The [facility] providers would like to encourage you to use [facility] to communicate with providers for non-urgent requests or questions.  Due to long hold times on the telephone, sending your provider a message by [facility] may be a faster and more efficient way to get a response.  Please allow 48 business hours for a response.  Please remember that this is for non-urgent requests.  ?_______________________________________________________ ? ?Please go to the lab on the 2nd floor [address] before you leave the office today.  ? ?You have been scheduled for a colonoscopy. Please follow written instructions given to you at your visit today.  ?Please pick up your prep supplies at the pharmacy within the next 1-3 days. ?If you use inhalers (even only as needed), please bring them with you on the day of your procedure.  ? ?We have sent the following medications to your pharmacy for you to pick up at your convenience: ?Sutab ? ?It was a pleasure to see you today! ? ?Doristine Locks, D.O. ? ? ? ? ? ? ?We want to thank you for trusting [facility] with your care. All of our staff and providers value the relationships we have built with our patients, and it is an honor to care for you.  ? ?We are writing to let you know that [facility] [place] will close on Jun 18, 2021, and we invite you to continue to see Dr. Edman Circle and Doristine Locks at the [facility] [place] office  location. We are consolidating our serices at these [facility] practices to better provide care. Our office staff will work with you to ensure a seamless transition.  ? ?Doristine Locks, DO -Dr. Barron Alvine will be movig to [facility] at [address] [place], [place] [address], effective Jun 18, 2021.  Contact [phone] to schedule an appointment with him.  ? ?Edman Circle, MD- Dr. Chales Abrahams will be movig to [facility] at [address] [place], [place] [address], effective Jun 18, 2021.  Contact [phone] to schedule an appointment with him.  ? ?Requesting Medical Records ?If you need to request your medical records, please follow the instructions below. Your medical records are confidential, and a copy can be transferred to another provider or released to you or another person you designate only with your permission. ? ?There are several ways to request your medical records: ?Requests for medical records can be submitted through our practice.   ?You can also request your records electronically, in your MyChart account by selecting the ?Request Health Records? tab.  ?If you need additional information on how to request records, please go to [email or web address], choose Patient Information, then select Request Medical Records. ?To make an appointment or if you have any questions about your health care needs, please contact our office at [phone] and one of our staff members will be glad to assist you. ?[facility] is committed to providing exceptional care for you and our community. Thank you for allowing [place]  to serve your health care needs. ?Sincerely, ? ?Trixie Dredge, Director [facility] ?[facility] also offers convenient virtual care options. Sore throat? Sinus problems? Cold or flu symptoms? Get care from the comfort of home with [facility] Video Visits and e-Visits. Learn more about the non-emergency conditions treated and start your virtual visit at  [email or web address] ? ? ? ? ?

## 2021-04-28 LAB — HEPATIC FUNCTION PANEL
ALT: 14 IU/L (ref 0–32)
AST: 18 IU/L (ref 0–40)
Albumin: 4.7 g/dL (ref 3.8–4.8)
Alkaline Phosphatase: 73 IU/L (ref 44–121)
Bilirubin Total: 0.7 mg/dL (ref 0.0–1.2)
Bilirubin, Direct: 0.21 mg/dL (ref 0.00–0.40)
Total Protein: 7.5 g/dL (ref 6.0–8.5)

## 2021-05-01 ENCOUNTER — Ambulatory Visit (HOSPITAL_BASED_OUTPATIENT_CLINIC_OR_DEPARTMENT_OTHER)
Admission: RE | Admit: 2021-05-01 | Discharge: 2021-05-01 | Disposition: A | Discharge status: Home / Self Care | Payer: BLUE CROSS/BLUE SHIELD | Source: Ambulatory Visit | Attending: Family | Admitting: Family

## 2021-05-01 ENCOUNTER — Encounter: Payer: Self-pay | Admitting: Family

## 2021-05-01 ENCOUNTER — Other Ambulatory Visit: Payer: Self-pay

## 2021-05-01 DIAGNOSIS — R161 Splenomegaly, not elsewhere classified: Secondary | ICD-10-CM | POA: Diagnosis not present

## 2021-05-01 DIAGNOSIS — N281 Cyst of kidney, acquired: Secondary | ICD-10-CM | POA: Diagnosis not present

## 2021-05-01 DIAGNOSIS — K7460 Unspecified cirrhosis of liver: Secondary | ICD-10-CM | POA: Diagnosis not present

## 2021-05-01 DIAGNOSIS — D696 Thrombocytopenia, unspecified: Secondary | ICD-10-CM | POA: Diagnosis not present

## 2021-05-01 DIAGNOSIS — B1920 Unspecified viral hepatitis C without hepatic coma: Secondary | ICD-10-CM | POA: Diagnosis not present

## 2021-05-02 ENCOUNTER — Telehealth: Payer: Self-pay | Admitting: Family

## 2021-05-02 NOTE — Telephone Encounter (Signed)
Left message with call back number to go over recent US results.  ?

## 2021-05-03 ENCOUNTER — Telehealth: Payer: BLUE CROSS/BLUE SHIELD | Admitting: Physician Assistant

## 2021-05-03 ENCOUNTER — Encounter: Payer: Self-pay | Admitting: Family Medicine

## 2021-05-03 ENCOUNTER — Encounter: Payer: Self-pay | Admitting: Family

## 2021-05-03 DIAGNOSIS — J019 Acute sinusitis, unspecified: Secondary | ICD-10-CM | POA: Diagnosis not present

## 2021-05-03 DIAGNOSIS — B9689 Other specified bacterial agents as the cause of diseases classified elsewhere: Secondary | ICD-10-CM

## 2021-05-03 MED ORDER — AMOXICILLIN-POT CLAVULANATE 875-125 MG PO TABS: 1.0000 | ORAL_TABLET | Freq: Two times a day (BID) | ORAL | 0 refills | Status: DC

## 2021-05-03 MED ORDER — PREDNISONE 10 MG (21) PO TBPK: ORAL_TABLET | ORAL | 0 refills | Status: DC

## 2021-05-03 NOTE — Progress Notes (Signed)
E-Visit for Sinus Problems ? ?We are sorry that you are not feeling well.  Here is how we plan to help! ? ?Based on what you have shared with me it looks like you have sinusitis.  Sinusitis is inflammation and infection in the sinus cavities of the head.  Based on your presentation and preceding allergy symptoms and nasal congestion, I believe you most likely have Acute Bacterial Sinusitis.  This is an infection caused by bacteria and is treated with antibiotics. I have prescribed Augmentin 875mg /125mg  one tablet twice daily with food, for 7 days. You may use an oral decongestant such as Mucinex D or if you have glaucoma or high blood pressure use plain Mucinex. Saline nasal spray help and can safely be used as often as needed for congestion.  If you develop worsening sinus pain, fever or notice severe headache and vision changes, or if symptoms are not better after completion of antibiotic, please schedule an appointment with a health care provider.   ? ?I will send in a prednisone tablet pack for you to take as well.  ? ?Sinus infections are not as easily transmitted as other respiratory infection, however we still recommend that you avoid close contact with loved ones, especially the very young and elderly.  Remember to wash your hands thoroughly throughout the day as this is the number one way to prevent the spread of infection! ? ?Home Care: ?Only take medications as instructed by your medical team. ?Complete the entire course of an antibiotic. ?Do not take these medications with alcohol. ?A steam or ultrasonic humidifier can help congestion.  You can place a towel over your head and breathe in the steam from hot water coming from a faucet. ?Avoid close contacts especially the very young and the elderly. ?Cover your mouth when you cough or sneeze. ?Always remember to wash your hands. ? ?Get Help Right Away If: ?You develop worsening fever or sinus pain. ?You develop a severe head ache or visual changes. ?Your  symptoms persist after you have completed your treatment plan. ? ?Make sure you ?Understand these instructions. ?Will watch your condition. ?Will get help right away if you are not doing well or get worse. ? ?Thank you for choosing an e-visit. ? ?Your e-visit answers were reviewed by a board certified advanced clinical practitioner to complete your personal care plan. Depending upon the condition, your plan could have included both over the counter or prescription medications. ? ?Please review your pharmacy choice. Make sure the pharmacy is open so you can pick up prescription now. If there is a problem, you may contact your provider through [employer] and have the prescription routed to another pharmacy.  Your safety is important to [place]. If you have drug allergies check your prescription carefully.  ? ?For the next 24 hours you can use MyChart to ask questions about today's visit, request a non-urgent call back, or ask for a work or school excuse. ?You will get an email in the next two days asking about your experience. I hope that your e-visit has been valuable and will speed your recovery. ? ?

## 2021-05-03 NOTE — Progress Notes (Signed)
I have spent 5 minutes in review of e-visit questionnaire, review and updating patient chart, medical decision making and response to patient.   Ryleeann Urquiza Cody Koralyn Prestage, PA-C    

## 2021-05-04 ENCOUNTER — Telehealth: Payer: Self-pay | Admitting: Family

## 2021-05-04 NOTE — Telephone Encounter (Signed)
I was able to speak with Amanda Maynard and go over recent US results. She did have noted cirrhosis and splenomegaly as well as a left renal cyst. Results routed to PCP and GI.  ?No other questions or concerns at this time. Patient appreciative of call.  ?

## 2021-05-07 ENCOUNTER — Other Ambulatory Visit: Payer: Self-pay

## 2021-05-07 ENCOUNTER — Telehealth: Payer: Self-pay

## 2021-05-07 DIAGNOSIS — K7460 Unspecified cirrhosis of liver: Secondary | ICD-10-CM

## 2021-05-07 DIAGNOSIS — Z8619 Personal history of other infectious and parasitic diseases: Secondary | ICD-10-CM

## 2021-05-07 NOTE — Telephone Encounter (Signed)
-----   Message from [facility] V, DO sent at 05/07/2021  1:15 PM EDT ----- ?Recent RUQ ultrasound Demonstrates heterogenous liver with coarsened echotexture and nodularity suggestive of cirrhosis.  There is mild splenomegaly, but otherwise no ascites and patent portal vein with appropriate flow. ? ?Otherwise recent normal liver enzymes including bilirubin and albumin, along with normal sodium and renal function. ? ?Plan for the following: ? ?- Proceed with EGD as scheduled and will evaluate for esophageal varices and portal hypertensive gastropathy changes at that time ?- Check PT/INR ? ?----- Message ----- ?From: Erenest Blank, NP ?Sent: 05/04/2021   1:09 PM EDT ?To: Shellia Cleverly, DO ? ? ?

## 2021-05-07 NOTE — Telephone Encounter (Signed)
Spoke with pt. Gave pt results and recommendations. Lab order entered. Pt verbalized understanding and stated she would be able to go [place] for lab.  ?

## 2021-05-09 ENCOUNTER — Other Ambulatory Visit (INDEPENDENT_AMBULATORY_CARE_PROVIDER_SITE_OTHER): Payer: BLUE CROSS/BLUE SHIELD

## 2021-05-09 DIAGNOSIS — K7460 Unspecified cirrhosis of liver: Secondary | ICD-10-CM

## 2021-05-09 DIAGNOSIS — Z8619 Personal history of other infectious and parasitic diseases: Secondary | ICD-10-CM | POA: Diagnosis not present

## 2021-05-09 LAB — PROTIME-INR
INR: 1.1 ratio — ABNORMAL HIGH (ref 0.8–1.0)
Prothrombin Time: 11.8 s (ref 9.6–13.1)

## 2021-05-18 ENCOUNTER — Encounter: Payer: Self-pay | Admitting: Gastroenterology

## 2021-05-23 ENCOUNTER — Telehealth: Payer: Self-pay | Admitting: Gastroenterology

## 2021-05-23 NOTE — Telephone Encounter (Signed)
Received records from her previous Gastroenterologist, Dr. Vista Mink, MD, at [facility] in [place], [place] and notable for the following: ? ?- 06/23/2013: Initial appointment in GI clinic for evaluation of chronic RUQ pain (with fried foods) for the last few years.  Also with increased reflux symptoms and episodic nausea.  Also with BRBPR and associated burning/itching.  No previous EGD/colonoscopy at that point.  Since recent ultrasound was reportedly normal (that record not available for review) ?- 08/09/2013: EGD: Mild, grade 1 esophagitis, (path from gastric biopsies benign mucosa, H. pylori negative).  Started on PPI ?- 08/09/2013: Colonoscopy: Moderate diverticulosis throughout, 7 mm villous adenoma removed from proximal sigmoid colon, external hemorrhoids ?- 10/07/2013: HIDA scan: Normal with 80% ejection fraction ?- 10/08/2013: GI follow-up appointment.  Recent HCV positive. 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  BUN/creatinine 6/0.84, sodium 143, potassium 3.7, AST/ALT 66/33, T. bili 1.0, ALP 89.  HCV 142,000, genotype Ia ?- 11/21/2016: CT abdomen: Liver is nodular in contour suggesting cirrhosis.  Slight heterogenous enhancement of the hepatic parenchyma without gross focal lesion.  Unremarkable GB, pancreas.  Splenomegaly.  Diverticulosis without diverticulitis.  No ascites. ?- 12/02/2016: Colonoscopy: moderate diverticulosis throughout the colon.  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  Small nonbleeding external hemorrhoids.  Repeat in 5 years ?- 12/13/2016: WBC 3.7, H/H11.6/36.6, MCV/RDW 82/22.5, PLT 93.  BUN/creatinine 6/0.62, sodium 142, potassium 4.1, AST/ALT 55/22, T. bili 1.2, ALP 96.  HCV negative ? ?- 03/15/2017: Protein 8.0, albumin 4.1, T. bili 1.2, direct bili 0.33, AST/ALT 50/23, ALP 80.  HCV negative. ?- 04/02/2017: CT abdomen/pelvis: Mildly enlarged liver measuring 18 cm.  Nodular contour is again  seen, compatible with cirrhosis.  Normal attenuation.  No liver lesion identified.  Mild contraction of the GB with mild wall thickening of 4 mm.  No pericholecystic fluid, gallstones, sludge.  No duct dilation.  Enlarged spleen measuring 17.3 cm.  A few tiny splenic cysts.  Normal pancreas without PD dilation.  Focal enteritis of the loop of jejunum within the central abdomen and the terminal ileum.  Mild portal hypertension with splenomegaly, enlarged main portal vein, enlarged splenic vein. ?- 08/21/2017: FibroSure: Score 0.55, stage F2 (bridging fibrosis with few septa), activity 0.  Protein 7.3, albumin 4.1, T. bili 0.9, AST/ALT 27/15, ALP 70.  HCV negative ?

## 2021-05-29 DIAGNOSIS — L661 Lichen planopilaris: Secondary | ICD-10-CM | POA: Diagnosis not present

## 2021-05-29 DIAGNOSIS — L433 Subacute (active) lichen planus: Secondary | ICD-10-CM | POA: Diagnosis not present

## 2021-05-29 DIAGNOSIS — L723 Sebaceous cyst: Secondary | ICD-10-CM | POA: Diagnosis not present

## 2021-05-29 DIAGNOSIS — D1801 Hemangioma of skin and subcutaneous tissue: Secondary | ICD-10-CM | POA: Diagnosis not present

## 2021-06-01 ENCOUNTER — Ambulatory Visit (AMBULATORY_SURGERY_CENTER): Payer: BLUE CROSS/BLUE SHIELD | Admitting: Gastroenterology

## 2021-06-01 ENCOUNTER — Encounter: Payer: Self-pay | Admitting: Gastroenterology

## 2021-06-01 VITALS — BP 127/75 | HR 63 | Temp 97.5°F | Resp 21 | Ht 63.0 in | Wt 198.0 lb

## 2021-06-01 DIAGNOSIS — K2970 Gastritis, unspecified, without bleeding: Secondary | ICD-10-CM

## 2021-06-01 DIAGNOSIS — K5730 Diverticulosis of large intestine without perforation or abscess without bleeding: Secondary | ICD-10-CM

## 2021-06-01 DIAGNOSIS — Z86010 Personal history of colonic polyps: Secondary | ICD-10-CM

## 2021-06-01 DIAGNOSIS — K319 Disease of stomach and duodenum, unspecified: Secondary | ICD-10-CM | POA: Diagnosis not present

## 2021-06-01 DIAGNOSIS — R12 Heartburn: Secondary | ICD-10-CM

## 2021-06-01 DIAGNOSIS — K219 Gastro-esophageal reflux disease without esophagitis: Secondary | ICD-10-CM

## 2021-06-01 DIAGNOSIS — K641 Second degree hemorrhoids: Secondary | ICD-10-CM

## 2021-06-01 MED ORDER — SODIUM CHLORIDE 0.9 % IV SOLN: 500.0000 mL | Freq: Once | INTRAVENOUS | Status: DC

## 2021-06-01 MED ORDER — PANTOPRAZOLE SODIUM 40 MG PO TBEC
40.0000 mg | DELAYED_RELEASE_TABLET | Freq: Two times a day (BID) | ORAL | 1 refills | Status: DC
  Filled 2022-02-11: qty 60, 30d supply, fill #0

## 2021-06-01 MED ORDER — SUCRALFATE 1 G PO TABS: 1.0000 g | ORAL_TABLET | Freq: Four times a day (QID) | ORAL | 0 refills | Status: DC

## 2021-06-01 NOTE — Op Note (Signed)
[facility] ?Patient Name: Amanda Maynard ?Procedure Date: 06/01/2021 1:11 PM ?MRN: [id number] ?Endoscopist: Gerrit Heck , MD ?Age: 61 ?Referring MD:  ?Date of Birth: Nov 17, 1960 ?Gender: Female ?Account #: [id number] ?Procedure:                Upper GI endoscopy ?Indications:              Heartburn, Suspected esophageal reflux,  ?                          Regurgitation ?Medicines:                Monitored Anesthesia Care ?Procedure:                Pre-Anesthesia Assessment: ?                          - Prior to the procedure, a History and Physical  ?                          was performed, and patient medications and  ?                          allergies were reviewed. The patient's tolerance of  ?                          previous anesthesia was also reviewed. The risks  ?                          and benefits of the procedure and the sedation  ?                          options and risks were discussed with the patient.  ?                          All questions were answered, and informed consent  ?                          was obtained. Prior Anticoagulants: The patient has  ?                          taken no previous anticoagulant or antiplatelet  ?                          agents. ASA Grade Assessment: II - A patient with  ?                          mild systemic disease. After reviewing the risks  ?                          and benefits, the patient was deemed in  ?                          satisfactory condition to undergo the procedure. ?  After obtaining informed consent, the endoscope was  ?                          passed under direct vision. Throughout the  ?                          procedure, the patient's blood pressure, pulse, and  ?                          oxygen saturations were monitored continuously. The  ?                          Endoscope was introduced through the mouth, and  ?                          advanced to the second part of duodenum. The upper   ?                          GI endoscopy was accomplished without difficulty.  ?                          The patient tolerated the procedure well. ?Scope In: ?Scope Out: ?Findings:                 The examined esophagus was normal. ?                          The Z-line was regular and was found 36 cm from the  ?                          incisors. ?                          Localized moderate inflammation characterized by  ?                          congestion (edema), erosions and erythema was found  ?                          in the gastric antrum. Biopsies were taken with a  ?                          cold forceps for histology. Estimated blood loss  ?                          was minimal. ?                          Localized mild inflammation characterized by  ?                          congestion (edema) and erythema was found in the  ?                          cardia. ?  The examined duodenum was normal. ?Complications:            No immediate complications. ?Estimated Blood Loss:     Estimated blood loss was minimal. ?Impression:               - Normal esophagus. ?                          - Z-line regular, 36 cm from the incisors. ?                          - Gastritis. Biopsied. ?                          - Gastritis. ?                          - Normal examined duodenum. ?Recommendation:           - Patient has a contact number available for  ?                          emergencies. The signs and symptoms of potential  ?                          delayed complications were discussed with the  ?                          patient. Return to normal activities tomorrow.  ?                          Written discharge instructions were provided to the  ?                          patient. ?                          - Resume previous diet. ?                          - Continue present medications. ?                          - Await pathology results. ?                          - Use Protonix  (pantoprazole) 40 mg PO BID for 8  ?                          weeks to promote mucosal healing, then decrease to  ?                          40 mg daily for reflux control and titrate to  ?                          lowest effective dose to control reflux. ?                          -  Use sucralfate tablets 1 gram PO QID for 4 weeks. ?Gerrit Heck, MD ?06/01/2021 2:02:24 PM ?

## 2021-06-01 NOTE — Progress Notes (Signed)
? ?GASTROENTEROLOGY PROCEDURE H&P NOTE  ? ?Primary Care Physician: ?[name], Gwenlyn Found, MD ? ? ? ?Reason for Procedure:   GERD, colon polyps  ? ?Plan:    EGD, Colonsocopy ? ?Patient is appropriate for endoscopic procedure(s) in the ambulatory ([facility]) setting. ? ?The nature of the procedure, as well as the risks, benefits, and alternatives were carefully and thoroughly reviewed with the patient. Ample time for discussion and questions allowed. The patient understood, was satisfied, and agreed to proceed.  ? ? ? ?HPI: ?Amanda Maynard is a 61 y.o. female who presents for EGD and colonsocopy for eval of GERD and colon polyp surveillance.  ? ?Past Medical History:  ?Diagnosis Date  ? Allergy   ? Anxiety   ? Arthritis   ? Chicken pox   ? Depression   ? Diverticulitis   ? Emphysema of lung (HCC)   ? GERD (gastroesophageal reflux disease)   ? Headache   ? History of colon polyps   ? Jaundice   ? Lichen planopilaris   ? Thyroid disease   ? Urinary incontinence   ? ? ?Past Surgical History:  ?Procedure Laterality Date  ? ABDOMINAL HYSTERECTOMY  2012  ? APPENDECTOMY  2009  ? COLONOSCOPY  12/02/2016  ? Dr. Vista Mink. Moderate diverticulosis of the ascending colon, transverse colon, descending colon and sigmoid colon, increase in vascularity of the rectum. External hemorrhoids.  ? COLONOSCOPY  08/09/2013  ? Dr. Vista Mink. Moderate diverticulosis of the hepatic flexure, transverse colon, splenic flexure, descending colon and sigmoid colon. Polyp (6mm) in the proximal sigmoid colon. (polypectomy). External hemorrhoids.  ? ESOPHAGOGASTRODUODENOSCOPY  08/09/2013  ? Dr. Vista Mink. Normal stomach. Normal duodenum. Grade 1 esophagitis was seen in the lower third of the esophagus.  ? ? ?Prior to Admission medications   ?Medication Sig Start Date End Date Taking? Authorizing Provider  ?escitalopram (LEXAPRO) 5 mg TABS tablet Every other night. 08/04/20  Yes [provider]  ?SYNTHROID 75 MCG tablet Take 75 mcg by mouth daily.  11/20/20  Yes [provider]  ?amoxicillin-clavulanate (AUGMENTIN) 875-125 MG tablet Take 1 tablet by mouth 2 (two) times daily. 05/03/21   Waldon Merl, PA-C  ?predniSONE (STERAPRED UNI-PAK 21 TAB) 10 MG (21) TBPK tablet Take following blister pack directions 05/03/21   Waldon Merl, PA-C  ?UNABLE TO FIND Med Name: Metabolic Spark. 2 caps daily.    [provider]  ?UNABLE TO FIND Med Name: Super Omega. 1 tbls daily.    [provider]  ?UNABLE TO FIND Med Name: Metabolic Protein Shake. 1 scoop with water daily.    [provider]  ? ? ?Current Outpatient Medications  ?Medication Sig Dispense Refill  ? escitalopram (LEXAPRO) 5 mg TABS tablet Every other night.    ? SYNTHROID 75 MCG tablet Take 75 mcg by mouth daily.    ? amoxicillin-clavulanate (AUGMENTIN) 875-125 MG tablet Take 1 tablet by mouth 2 (two) times daily. 14 tablet 0  ? predniSONE (STERAPRED UNI-PAK 21 TAB) 10 MG (21) TBPK tablet Take following blister pack directions 21 tablet 0  ? UNABLE TO FIND Med Name: Metabolic Spark. 2 caps daily.    ? UNABLE TO FIND Med Name: Super Omega. 1 tbls daily.    ? UNABLE TO FIND Med Name: Metabolic Protein Shake. 1 scoop with water daily.    ? ?Current Facility-Administered Medications  ?Medication Dose Route Frequency Provider Last Rate Last Admin  ? 0.9 %  sodium chloride infusion  500 mL Intravenous  Once [employer], DO      ? ? ?Allergies as of 06/01/2021 - Review Complete 06/01/2021  ?Allergen Reaction Noted  ? Molds & smuts Shortness Of Breath 12/13/2020  ? Sertraline  12/13/2020  ? Sulfa antibiotics Itching, Nausea Only, Rash, and Nausea And Vomiting 12/13/2020  ? ? ?Family History  ?Problem Relation Age of Onset  ? Breast cancer Mother 62  ? Heart disease Mother   ? Hearing loss Mother   ? Depression Mother   ? Cancer Mother   ? Arthritis Mother   ? Cancer Father   ? Hyperlipidemia Father   ? Heart disease Maternal Grandmother   ? Cancer Maternal Grandfather    ? Arthritis Paternal Grandmother   ? Cancer Paternal Grandfather   ? Heart attack Paternal Grandfather   ? Esophageal cancer Neg Hx   ? Colon cancer Neg Hx   ? Stomach cancer Neg Hx   ? Rectal cancer Neg Hx   ? ? ?Social History  ? ?Socioeconomic History  ? Marital status: Divorced  ?  Spouse name: Not on file  ? Number of children: Not on file  ? Years of education: Not on file  ? Highest education level: Not on file  ?Occupational History  ? Not on file  ?Tobacco Use  ? Smoking status: Every Day  ?  Packs/day: 0.50  ?  Years: 30.00  ?  Pack years: 15.00  ?  Types: Cigarettes  ? Smokeless tobacco: Not on file  ? Tobacco comments:  ?  Have quit twice in 30 years but start again when stress level is too high  ?Substance and Sexual Activity  ? Alcohol use: Yes  ?  Alcohol/week: 6.0 standard drinks  ?  Types: 3 Cans of beer, 3 Standard drinks or equivalent per week  ? Drug use: Never  ? Sexual activity: Not Currently  ?  Birth control/protection: Abstinence, None  ?  Comment: Had hysterectomy and no sex drive since the surgery  ?Other Topics Concern  ? Not on file  ?Social History Narrative  ? Not on file  ? ?Social Determinants of Health  ? ?Financial Resource Strain: Not on file  ?Food Insecurity: Not on file  ?Transportation Needs: Not on file  ?Physical Activity: Not on file  ?Stress: Not on file  ?Social Connections: Not on file  ?Intimate Partner Violence: Not on file  ? ? ?Physical Exam: ?Vital signs in last 24 hours: ?@BP  123/81   Pulse 64   Temp (!) 97.5 ?F (36.4 ?C) (Temporal)   Resp 14   Ht 5\' 3"  (1.6 m)   Wt 198 lb (89.8 kg)   SpO2 97%   BMI 35.07 kg/m?  ?GEN: NAD ?EYE: Sclerae anicteric ?ENT: MMM ?CV: Non-tachycardic ?Pulm: CTA b/l ?GI: Soft, NT/ND ?NEURO:  Alert & Oriented x 3 ? ? ? , DO ?[facility] ? ? ?06/01/2021 1:22 PM ? ?

## 2021-06-01 NOTE — Patient Instructions (Signed)
YOU HAD AN ENDOSCOPIC PROCEDURE TODAY: Refer to the procedure report and other information in the discharge instructions given to you for any specific questions about what was found during the examination. If this information does not answer your questions, please call [place] office at [phone] to clarify.  ° °YOU SHOULD EXPECT: Some feelings of bloating in the abdomen. Passage of more gas than usual. Walking can help get rid of the air that was put into your GI tract during the procedure and reduce the bloating. If you had a lower endoscopy (such as a colonoscopy or flexible sigmoidoscopy) you may notice spotting of blood in your stool or on the toilet paper. Some abdominal soreness may be present for a day or two, also. ° °DIET: Your first meal following the procedure should be a light meal and then it is ok to progress to your normal diet. A half-sandwich or bowl of soup is an example of a good first meal. Heavy or fried foods are harder to digest and may make you feel nauseous or bloated. Drink plenty of fluids but you should avoid alcoholic beverages for 24 hours. If you had a esophageal dilation, please see attached instructions for diet.   ° °ACTIVITY: Your care partner should take you home directly after the procedure. You should plan to take it easy, moving slowly for the rest of the day. You can resume normal activity the day after the procedure however YOU SHOULD NOT DRIVE, use power tools, machinery or perform tasks that involve climbing or major physical exertion for 24 hours (because of the sedation medicines used during the test).  ° °SYMPTOMS TO REPORT IMMEDIATELY: °A gastroenterologist can be reached at any hour. Please call [phone]  for any of the following symptoms:  °Following lower endoscopy (colonoscopy, flexible sigmoidoscopy) °Excessive amounts of blood in the stool  °Significant tenderness, worsening of abdominal pains  °Swelling of the abdomen that is new, acute  °Fever of 100° or  higher  °Following upper endoscopy (EGD, EUS, ERCP, esophageal dilation) °Vomiting of blood or coffee ground material  °New, significant abdominal pain  °New, significant chest pain or pain under the shoulder blades  °Painful or persistently difficult swallowing  °New shortness of breath  °Black, tarry-looking or red, bloody stools ° °FOLLOW UP:  °If any biopsies were taken you will be contacted by phone or by letter within the next 1-3 weeks. Call [phone]  if you have not heard about the biopsies in 3 weeks.  °Please also call with any specific questions about appointments or follow up tests. ° °

## 2021-06-01 NOTE — Progress Notes (Signed)
Report to PACU, RN, vss, BBS= Clear.  

## 2021-06-01 NOTE — Progress Notes (Signed)
Pt's states no medical or surgical changes since previsit or office visit. 

## 2021-06-01 NOTE — Op Note (Signed)
[facility] ?Patient Name: Amanda Maynard ?Procedure Date: 06/01/2021 1:10 PM ?MRN: [id number] ?Endoscopist: Doristine Locks , MD ?Age: 61 ?Referring MD:  ?Date of Birth: [birth year] ?Gender: Female ?Account #: [id number] ?Procedure:                Colonoscopy ?Indications:              High risk colon cancer surveillance: Personal  ?                          history of colonic polyps ?                          ??? 08/09/2013: Colonoscopy: Moderate diverticulosis  ?                          throughout, 7 mm villous adenoma removed from  ?                          proximal sigmoid colon, external hemorrhoids ?                          ??? 12/02/2016: Colonoscopy: moderate diverticulosis  ?                          throughout the colon. 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  ?                          Small nonbleeding external hemorrhoids. Repeat in 5  ?                          years ?Medicines:                Monitored Anesthesia Care ?Procedure:                Pre-Anesthesia Assessment: ?                          - Prior to the procedure, a History and Physical  ?                          was performed, and patient medications and  ?                          allergies were reviewed. The patient's tolerance of  ?                          previous anesthesia was also reviewed. The risks  ?                          and benefits of the procedure and the sedation  ?                          options and risks were discussed with the patient.  ?                          All questions were answered, and informed  consent  ?                          was obtained. Prior Anticoagulants: The patient has  ?                          taken no previous anticoagulant or antiplatelet  ?                          agents. ASA Grade Assessment: II - A patient with  ?                          mild systemic disease. After reviewing the risks  ?                          and benefits, the patient was deemed in  ?                           satisfactory condition to undergo the procedure. ?                          After obtaining informed consent, the colonoscope  ?                          was passed under direct vision. Throughout the  ?                          procedure, the patient's blood pressure, pulse, and  ?                          oxygen saturations were monitored continuously. The  ?                          CF [id number] #[id number] was introduced through the anus  ?                          and advanced to the the terminal ileum. The  ?                          colonoscopy was technically difficult and complex  ?                          due to a tortuous colon. Successful completion of  ?                          the procedure was aided by applying abdominal  ?                          pressure. The patient tolerated the procedure well.  ?                          The quality of the bowel preparation was good. The  ?  terminal ileum, ileocecal valve, appendiceal  ?                          orifice, and rectum were photographed. ?Scope In: 1:35:19 PM ?Scope Out: 1:54:32 PM ?Scope Withdrawal Time: 0 hours 10 minutes 43 seconds  ?Total Procedure Duration: 0 hours 19 minutes 13 seconds  ?Findings:                 The perianal and digital rectal examinations were  ?                          normal. ?                          The sigmoid colon was significantly tortuous.  ?                          Advancing the scope required applying abdominal  ?                          pressure. ?                          Multiple small and large-mouthed diverticula were  ?                          found in the sigmoid colon, descending colon,  ?                          transverse colon and ascending colon. The colon was  ?                          otherwise normal appearing. ?                          Non-bleeding internal hemorrhoids were found during  ?                          retroflexion. The hemorrhoids were medium-sized. ?                           The terminal ileum appeared normal. ?Complications:            No immediate complications. ?Estimated Blood Loss:     Estimated blood loss: none. ?Impression:               - Tortuous colon. ?                          - Diverticulosis in the sigmoid colon, in the  ?                          descending colon, in the transverse colon and in  ?                          the ascending colon. ?                          -  Non-bleeding internal hemorrhoids. ?                          - The examined portion of the ileum was normal. ?                          - No specimens collected. ?Recommendation:           - Patient has a contact number available for  ?                          emergencies. The signs and symptoms of potential  ?                          delayed complications were discussed with the  ?                          patient. Return to normal activities tomorrow.  ?                          Written discharge instructions were provided to the  ?                          patient. ?                          - Resume previous diet. ?                          - Continue present medications. ?                          - Repeat colonoscopy in 10 years for screening  ?                          purposes. ?                          - Return to GI office PRN. ?Doristine Locks, MD ?06/01/2021 2:07:23 PM ?

## 2021-06-05 ENCOUNTER — Telehealth: Payer: Self-pay

## 2021-06-05 NOTE — Telephone Encounter (Signed)
?  Follow up Call- ? ? ?  06/01/2021  ? 12:45 PM  ?Call back number  ?Post procedure Call Back phone  # [phone]  ?Permission to leave phone message Yes  ?  ? ?Patient questions: ? ?Do you have a fever, pain , or abdominal swelling? No. ?Pain Score  0 * ? ?Have you tolerated food without any problems? Yes.   ? ?Have you been able to return to your normal activities? Yes.   ? ?Do you have any questions about your discharge instructions: ?Diet   No. ?Medications  No. ?Follow up visit  No. ? ?Do you have questions or concerns about your Care? No. ? ?Actions: ?* If pain score is 4 or above: ?No action needed, pain <4. ? ? ? ? ?

## 2021-06-11 ENCOUNTER — Encounter: Payer: Self-pay | Admitting: Gastroenterology

## 2021-06-15 ENCOUNTER — Telehealth: Payer: Self-pay | Admitting: Gastroenterology

## 2021-06-15 ENCOUNTER — Encounter: Payer: Self-pay | Admitting: Gastroenterology

## 2021-06-15 NOTE — Telephone Encounter (Signed)
See 5/12 mychart message.  ?

## 2021-06-15 NOTE — Telephone Encounter (Signed)
Patient called would like to discuss with a nurse if she is still to continue the antibiotics. ?

## 2021-06-15 NOTE — Telephone Encounter (Signed)
Left message for pt to call back  °

## 2021-06-25 NOTE — Telephone Encounter (Signed)
That is correct, the biopsies demonstrate mild inflammation, but no infection.  No need for antibiotics.  Okay to stop taking sucralfate given potential side effect.  Continue taking Protonix this controls reflux well.  If any ongoing GI symptoms, can also trial FD guard.

## 2021-08-06 ENCOUNTER — Encounter: Payer: Self-pay | Admitting: Family Medicine

## 2021-08-24 ENCOUNTER — Other Ambulatory Visit: Payer: Self-pay | Admitting: Family Medicine

## 2021-08-27 ENCOUNTER — Other Ambulatory Visit (HOSPITAL_BASED_OUTPATIENT_CLINIC_OR_DEPARTMENT_OTHER): Payer: Self-pay

## 2021-08-27 MED ORDER — SYNTHROID 75 MCG PO TABS
75.0000 ug | ORAL_TABLET | Freq: Every day | ORAL | 0 refills | Status: DC
  Filled 2021-08-27: qty 90, 90d supply, fill #0

## 2021-10-10 DIAGNOSIS — R079 Chest pain, unspecified: Secondary | ICD-10-CM | POA: Insufficient documentation

## 2021-10-21 NOTE — Progress Notes (Addendum)
[facility] [address] [place], [place] [address] [phone]  Date:  10/25/2021   Name:  Amanda Maynard   DOB:  [birth year]   MRN:  [id number]  PCP:  Pearline Cables, MD    Chief Complaint: 6 month follow up (Concerns/ questions: 1. R ear discomfort 2. Fatigue- would like her iron checked. 3. Chest pain x 2 weeks. 4. Fall 1 week ago and hit the back of her head. No LOC. /Flu shot today: decline)   History of Present Illness:  Amanda Maynard is a 61 y.o. very pleasant female patient who presents with the following:  Here today for a recheck visit-she has several concerns Last seen by myself 3/23 History of hypothyroidism previous history of hepatitis C status post curative treatment   She is retired from her work in the [employer].  Divorced, no children. 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, this is fairly labor-intensive  Flu shot- declines today  Labs 6 months ago   For the last 2 weeks she has chest pain at night She has noted pain in her left chest only when she lays down at night She thinks it is likely muscular  No change in her SOB- she is a chronic smoker and does have a chronic cough but no change here It is worse if she tries to lay on her left shoulder Being flat on her back is better She is getting some left shoulder pain as well with range of motion  No personal history of CAD but it does run in her family Never did any cardiac testing  Her right ear has felt clogged She would like an iron level today - she is feeling fatigued She is taking care of her mother which is a lot of work for her   Pt also notes she fell- slipped on a wet floor- about 10 days ago and hit her head on the floor No loss of consciousness but she did have a bump in her head and did not feel great for couple of days.  However, she now feels back to normal.  She just wanted  to mention it  Lab Results  Component Value Date   TSH 0.95 04/18/2021     Patient Active Problem List   Diagnosis Date Noted   Hepatitis C antibody test positive 12/13/2020   Hypothyroidism (acquired) 12/13/2020    Past Medical History:  Diagnosis Date   Allergy    Anxiety    Arthritis    Chicken pox    Depression    Diverticulitis    Emphysema of lung (HCC)    GERD (gastroesophageal reflux disease)    Headache    History of colon polyps    Jaundice    Lichen planopilaris    Thyroid disease    Urinary incontinence     Past Surgical History:  Procedure Laterality Date   ABDOMINAL HYSTERECTOMY  2012   APPENDECTOMY  2009   COLONOSCOPY  12/02/2016   Dr. Vista Mink. Moderate diverticulosis of the ascending colon, transverse colon, descending colon and sigmoid colon, increase in vascularity of the rectum. External hemorrhoids.   COLONOSCOPY  08/09/2013   Dr. Vista Mink. Moderate diverticulosis of the hepatic flexure, transverse colon, splenic flexure, descending colon and sigmoid colon. Polyp (38mm) in the proximal sigmoid colon. (polypectomy). External hemorrhoids.   ESOPHAGOGASTRODUODENOSCOPY  08/09/2013   Dr.  Vista Mink. Normal stomach. Normal duodenum. Grade 1 esophagitis was seen in the lower third of the esophagus.    Social History   Tobacco Use   Smoking status: Every Day    Packs/day: 0.50    Years: 30.00    Total pack years: 15.00    Types: Cigarettes   Tobacco comments:    Have quit twice in 30 years but start again when stress level is too high  Substance Use Topics   Alcohol use: Yes    Alcohol/week: 6.0 standard drinks of alcohol    Types: 3 Cans of beer, 3 Standard drinks or equivalent per week   Drug use: Never    Family History  Problem Relation Age of Onset   Breast cancer Mother 45   Heart disease Mother    Hearing loss Mother    Depression Mother    Cancer Mother    Arthritis Mother    Cancer Father    Hyperlipidemia Father    Heart  disease Maternal Grandmother    Cancer Maternal Grandfather    Arthritis Paternal Grandmother    Cancer Paternal Grandfather    Heart attack Paternal Grandfather    Esophageal cancer Neg Hx    Colon cancer Neg Hx    Stomach cancer Neg Hx    Rectal cancer Neg Hx     Allergies  Allergen Reactions   Molds & Smuts Shortness Of Breath   Sertraline    Sulfa Antibiotics Itching, Nausea Only, Rash and Nausea And Vomiting    Medication list has been reviewed and updated.  Current Outpatient Medications on File Prior to Visit  Medication Sig Dispense Refill   escitalopram (LEXAPRO) 5 mg TABS tablet Every other night.     pantoprazole (PROTONIX) 40 MG tablet Take 1 tablet (40 mg total) by mouth 2 (two) times daily. 60 tablet 3   predniSONE (STERAPRED UNI-PAK 21 TAB) 10 MG (21) TBPK tablet Take following blister pack directions 21 tablet 0   sucralfate (CARAFATE) 1 g tablet Take 1 tablet (1 g total) by mouth 4 (four) times daily. 124 tablet 0   sucralfate (CARAFATE) 1 g tablet Take 1 g by mouth 4 (four) times daily. Qid x 4 weeks     SYNTHROID 75 MCG tablet Take 1 tablet (75 mcg total) by mouth daily before breakfast. 90 tablet 0   UNABLE TO FIND Med Name: Metabolic Spark. 2 caps daily.     UNABLE TO FIND Med Name: Super Omega. 1 tbls daily.     UNABLE TO FIND Med Name: Metabolic Protein Shake. 1 scoop with water daily.     No current facility-administered medications on file prior to visit.    Review of Systems:  As per HPI- otherwise negative.   Physical Examination: Vitals:   10/25/21 1055  BP: 110/70  Pulse: 71  Resp: 18  Temp: 97.7 F (36.5 C)  SpO2: 97%   Vitals:   10/25/21 1055  Weight: 200 lb (90.7 kg)  Height: 5\' 3"  (1.6 m)   Body mass index is 35.43 kg/m. Ideal Body Weight: Weight in (lb) to have BMI = 25: 140.8  GEN: no acute distress. Obese, looks well  HEENT: Atraumatic, Normocephalic. Bilateral TM wnl, oropharynx normal.  PEERL,EOMI. no hematoma or other  superficial scalp injuries noted from recent fall Ears and Nose: No external deformity. CV: RRR, No M/G/R. No JVD. No thrill. No extra heart sounds. PULM: CTA B, no wheezes, crackles, rhonchi. No retractions. No resp. distress. No  accessory muscle use. I cannot reproduce the left-sided chest pain with exam ABD: S, NT, ND, +BS. No rebound. No HSM. EXTR: No c/c/e PSYCH: Normally interactive. Conversant.  There is a small amount of cerumen in her right ear canal.  This is removed with warm water irrigation.  Patient tolerated well  EKG: SR with mild short PR- no ST changed noted No old tracing for comparison Assessment and Plan: Left-sided chest pain - Plan: EKG 12-Lead, Troponin I (High Sensitivity), CT CARDIAC SCORING (SELF PAY ONLY)  Hypothyroidism (acquired) - Plan: TSH, SYNTHROID 75 MCG tablet  Screening for hyperlipidemia - Plan: Lipid panel  Screening for diabetes mellitus - Plan: Hemoglobin T4H, Basic metabolic panel  Fatigue, unspecified type - Plan: CBC, Ferritin  Mild depression - Plan: escitalopram (LEXAPRO) 5 MG tablet  Patient seen today with a few concerns.  Main issue is atypical chest pain.  Given history I doubt this represents CAD, but will get further evaluation.  EKG today is reassuring.  Obtain troponin, ordered a CT coronary calcium to be done ASAP.  Advised patient to please let me know if this is changing or getting worse, certainly seek emergency care if any significant worsening  Routine lab work is pending as above  Refilled Lexapro  Removed earwax from right ear  Signed Lamar Blinks, MD Received her stat troponin, message to patient Results for orders placed or performed in visit on 10/25/21  Troponin I  Result Value Ref Range   Troponin I <3 < OR = 47 ng/L   Received labs 9/22- message to pt  Results for orders placed or performed in visit on 10/25/21  CBC  Result Value Ref Range   WBC 4.3 4.0 - 10.5 K/uL   RBC 4.49 3.87 - 5.11 Mil/uL    Platelets 102.0 (L) 150.0 - 400.0 K/uL   Hemoglobin 12.7 12.0 - 15.0 g/dL   HCT 38.5 36.0 - 46.0 %   MCV 85.7 78.0 - 100.0 fl   MCHC 33.0 30.0 - 36.0 g/dL   RDW 15.1 11.5 - 15.5 %  Lipid panel  Result Value Ref Range   Cholesterol 190 0 - 200 mg/dL   Triglycerides 92.0 0.0 - 149.0 mg/dL   HDL 51.40 >39.00 mg/dL   VLDL 18.4 0.0 - 40.0 mg/dL   LDL Cholesterol 120 (H) 0 - 99 mg/dL   Total CHOL/HDL Ratio 4    NonHDL 138.23   TSH  Result Value Ref Range   TSH 1.12 0.35 - 5.50 uIU/mL  Basic metabolic panel  Result Value Ref Range   Sodium 140 135 - 145 mEq/L   Potassium 4.5 3.5 - 5.1 mEq/L   Chloride 103 96 - 112 mEq/L   CO2 29 19 - 32 mEq/L   Glucose, Bld 78 70 - 99 mg/dL   BUN 19 6 - 23 mg/dL   Creatinine, Ser 0.80 0.40 - 1.20 mg/dL   GFR 79.33 >60.00 mL/min   Calcium 9.9 8.4 - 10.5 mg/dL  Ferritin  Result Value Ref Range   Ferritin 22.5 10.0 - 291.0 ng/mL  Troponin I  Result Value Ref Range   Troponin I <3 < OR = 47 ng/L

## 2021-10-22 ENCOUNTER — Ambulatory Visit: Payer: BLUE CROSS/BLUE SHIELD | Admitting: Family Medicine

## 2021-10-25 ENCOUNTER — Ambulatory Visit (INDEPENDENT_AMBULATORY_CARE_PROVIDER_SITE_OTHER): Payer: 59 | Admitting: Family Medicine

## 2021-10-25 ENCOUNTER — Other Ambulatory Visit (HOSPITAL_BASED_OUTPATIENT_CLINIC_OR_DEPARTMENT_OTHER): Payer: Self-pay

## 2021-10-25 ENCOUNTER — Other Ambulatory Visit (INDEPENDENT_AMBULATORY_CARE_PROVIDER_SITE_OTHER): Payer: 59

## 2021-10-25 VITALS — BP 110/70 | HR 71 | Temp 97.7°F | Resp 18 | Ht 63.0 in | Wt 200.0 lb

## 2021-10-25 DIAGNOSIS — R5383 Other fatigue: Secondary | ICD-10-CM | POA: Diagnosis not present

## 2021-10-25 DIAGNOSIS — Z13220 Encounter for screening for lipoid disorders: Secondary | ICD-10-CM | POA: Diagnosis not present

## 2021-10-25 DIAGNOSIS — Z131 Encounter for screening for diabetes mellitus: Secondary | ICD-10-CM | POA: Diagnosis not present

## 2021-10-25 DIAGNOSIS — R079 Chest pain, unspecified: Secondary | ICD-10-CM

## 2021-10-25 DIAGNOSIS — E039 Hypothyroidism, unspecified: Secondary | ICD-10-CM

## 2021-10-25 DIAGNOSIS — F32A Depression, unspecified: Secondary | ICD-10-CM

## 2021-10-25 LAB — BASIC METABOLIC PANEL
BUN: 19 mg/dL (ref 6–23)
CO2: 29 mEq/L (ref 19–32)
Calcium: 9.9 mg/dL (ref 8.4–10.5)
Chloride: 103 mEq/L (ref 96–112)
Creatinine, Ser: 0.8 mg/dL (ref 0.40–1.20)
GFR: 79.33 mL/min (ref >60.00)
Glucose, Bld: 78 mg/dL (ref 70–99)
Potassium: 4.5 mEq/L (ref 3.5–5.1)
Sodium: 140 mEq/L (ref 135–145)

## 2021-10-25 LAB — CBC
HCT: 38.5 % (ref 36.0–46.0)
Hemoglobin: 12.7 g/dL (ref 12.0–15.0)
MCHC: 33 g/dL (ref 30.0–36.0)
MCV: 85.7 fl (ref 78.0–100.0)
Platelets: 102 10*3/uL — ABNORMAL LOW (ref 150.0–400.0)
RBC: 4.49 Mil/uL (ref 3.87–5.11)
RDW: 15.1 % (ref 11.5–15.5)
WBC: 4.3 10*3/uL (ref 4.0–10.5)

## 2021-10-25 LAB — LIPID PANEL
Cholesterol: 190 mg/dL (ref 0–200)
HDL: 51.4 mg/dL (ref >39.00)
LDL Cholesterol: 120 mg/dL — ABNORMAL HIGH (ref 0–99)
NonHDL: 138.23
Total CHOL/HDL Ratio: 4
Triglycerides: 92 mg/dL (ref 0.0–149.0)
VLDL: 18.4 mg/dL (ref 0.0–40.0)

## 2021-10-25 LAB — TROPONIN I: Troponin I: <3 ng/L (ref <=47)

## 2021-10-25 MED ORDER — ESCITALOPRAM OXALATE 5 MG PO TABS
5.0000 mg | ORAL_TABLET | Freq: Every day | ORAL | 3 refills | Status: DC
  Filled 2021-10-25: qty 30, 30d supply, fill #0
  Filled 2021-11-22: qty 30, 30d supply, fill #1
  Filled 2022-01-01: qty 30, 30d supply, fill #2
  Filled 2022-02-11: qty 30, 30d supply, fill #3
  Filled 2022-03-08: qty 30, 30d supply, fill #4
  Filled 2022-04-09: qty 30, 30d supply, fill #5

## 2021-10-25 MED ORDER — SYNTHROID 75 MCG PO TABS
75.0000 ug | ORAL_TABLET | Freq: Every day | ORAL | 3 refills | Status: DC
  Filled 2021-10-25 – 2021-11-22 (×2): qty 90, 90d supply, fill #0
  Filled 2022-02-26: qty 90, 90d supply, fill #1
  Filled 2022-05-25: qty 90, 90d supply, fill #2
  Filled 2022-08-26: qty 90, 90d supply, fill #3

## 2021-10-25 NOTE — Patient Instructions (Addendum)
It was good to see you again today  Continue lexapro daily We will check your labs today I suspect your chest pain is muscular, but we will do a bit more to make sure not your heart Troponin level today Will set up for a CT coronary to check on your heart arteries  -see if this can be done today

## 2021-10-26 ENCOUNTER — Encounter: Payer: Self-pay | Admitting: Family Medicine

## 2021-10-26 LAB — TSH: TSH: 1.12 u[IU]/mL (ref 0.35–5.50)

## 2021-10-26 LAB — FERRITIN: Ferritin: 22.5 ng/mL (ref 10.0–291.0)

## 2021-10-29 ENCOUNTER — Ambulatory Visit (HOSPITAL_BASED_OUTPATIENT_CLINIC_OR_DEPARTMENT_OTHER)
Admission: RE | Admit: 2021-10-29 | Discharge: 2021-10-29 | Disposition: A | Discharge status: Home / Self Care | Payer: 59 | Source: Ambulatory Visit | Attending: Family Medicine | Admitting: Family Medicine

## 2021-10-29 ENCOUNTER — Encounter: Payer: Self-pay | Admitting: Family Medicine

## 2021-10-29 DIAGNOSIS — R079 Chest pain, unspecified: Secondary | ICD-10-CM | POA: Insufficient documentation

## 2021-10-29 LAB — HEMOGLOBIN A1C: Hgb A1c MFr Bld: 5.3 % (ref 4.6–6.5)

## 2021-11-22 ENCOUNTER — Other Ambulatory Visit (HOSPITAL_BASED_OUTPATIENT_CLINIC_OR_DEPARTMENT_OTHER): Payer: Self-pay

## 2021-12-05 DIAGNOSIS — M545 Low back pain, unspecified: Secondary | ICD-10-CM | POA: Insufficient documentation

## 2021-12-20 DIAGNOSIS — G8929 Other chronic pain: Secondary | ICD-10-CM | POA: Diagnosis not present

## 2021-12-20 DIAGNOSIS — R030 Elevated blood-pressure reading, without diagnosis of hypertension: Secondary | ICD-10-CM | POA: Diagnosis not present

## 2021-12-20 DIAGNOSIS — Z720 Tobacco use: Secondary | ICD-10-CM | POA: Diagnosis not present

## 2021-12-20 DIAGNOSIS — R69 Illness, unspecified: Secondary | ICD-10-CM | POA: Diagnosis not present

## 2021-12-20 DIAGNOSIS — Z803 Family history of malignant neoplasm of breast: Secondary | ICD-10-CM | POA: Diagnosis not present

## 2021-12-20 DIAGNOSIS — Z791 Long term (current) use of non-steroidal anti-inflammatories (NSAID): Secondary | ICD-10-CM | POA: Diagnosis not present

## 2021-12-20 DIAGNOSIS — Z882 Allergy status to sulfonamides status: Secondary | ICD-10-CM | POA: Diagnosis not present

## 2021-12-20 DIAGNOSIS — F324 Major depressive disorder, single episode, in partial remission: Secondary | ICD-10-CM | POA: Diagnosis not present

## 2021-12-20 DIAGNOSIS — K219 Gastro-esophageal reflux disease without esophagitis: Secondary | ICD-10-CM | POA: Diagnosis not present

## 2021-12-20 DIAGNOSIS — Z6835 Body mass index (BMI) 35.0-35.9, adult: Secondary | ICD-10-CM | POA: Diagnosis not present

## 2021-12-20 DIAGNOSIS — Z8249 Family history of ischemic heart disease and other diseases of the circulatory system: Secondary | ICD-10-CM | POA: Diagnosis not present

## 2022-01-01 ENCOUNTER — Other Ambulatory Visit (HOSPITAL_BASED_OUTPATIENT_CLINIC_OR_DEPARTMENT_OTHER): Payer: Self-pay

## 2022-02-01 ENCOUNTER — Encounter (INDEPENDENT_AMBULATORY_CARE_PROVIDER_SITE_OTHER): Payer: 59 | Admitting: Family Medicine

## 2022-02-01 DIAGNOSIS — J0110 Acute frontal sinusitis, unspecified: Secondary | ICD-10-CM

## 2022-02-02 MED ORDER — PREDNISONE 20 MG PO TABS: ORAL_TABLET | ORAL | 0 refills | Status: DC

## 2022-02-02 MED ORDER — AMOXICILLIN 500 MG PO CAPS: 1000.0000 mg | ORAL_CAPSULE | Freq: Two times a day (BID) | ORAL | 0 refills | Status: DC

## 2022-02-02 NOTE — Telephone Encounter (Signed)
Please see the MyChart message reply(ies) for my assessment and plan.  The patient gave consent for this Medical Advice Message and is aware that it may result in a bill to their insurance company as well as the possibility that this may result in a co-payment or deductible. They are an established patient, but are not seeking medical advice exclusively about a problem treated during an in person or video visit in the last 7 days. I did not recommend an in person or video visit within 7 days of my reply.  I spent a total of 8 minutes cumulative time within 7 days through MyChart messaging Hermine Feria, MD  

## 2022-02-04 DIAGNOSIS — F419 Anxiety disorder, unspecified: Secondary | ICD-10-CM | POA: Insufficient documentation

## 2022-02-11 ENCOUNTER — Other Ambulatory Visit (HOSPITAL_BASED_OUTPATIENT_CLINIC_OR_DEPARTMENT_OTHER): Payer: Self-pay | Admitting: Family Medicine

## 2022-02-11 ENCOUNTER — Other Ambulatory Visit: Payer: Self-pay | Admitting: Gastroenterology

## 2022-02-11 ENCOUNTER — Other Ambulatory Visit (HOSPITAL_BASED_OUTPATIENT_CLINIC_OR_DEPARTMENT_OTHER): Payer: Self-pay

## 2022-02-11 DIAGNOSIS — Z1231 Encounter for screening mammogram for malignant neoplasm of breast: Secondary | ICD-10-CM

## 2022-02-11 MED ORDER — PANTOPRAZOLE SODIUM 40 MG PO TBEC
40.0000 mg | DELAYED_RELEASE_TABLET | Freq: Every day | ORAL | 0 refills | Status: DC
  Filled 2022-02-11: qty 30, 30d supply, fill #0
  Filled 2022-02-12: qty 60, 30d supply, fill #0
  Filled 2022-03-10: qty 60, 30d supply, fill #1

## 2022-02-12 ENCOUNTER — Other Ambulatory Visit (HOSPITAL_BASED_OUTPATIENT_CLINIC_OR_DEPARTMENT_OTHER): Payer: Self-pay

## 2022-02-13 ENCOUNTER — Encounter: Payer: Self-pay | Admitting: Family Medicine

## 2022-02-13 DIAGNOSIS — J0110 Acute frontal sinusitis, unspecified: Secondary | ICD-10-CM

## 2022-02-13 MED ORDER — AMOXICILLIN-POT CLAVULANATE 875-125 MG PO TABS: 1.0000 | ORAL_TABLET | Freq: Two times a day (BID) | ORAL | 0 refills | Status: DC

## 2022-02-13 MED ORDER — PREDNISONE 20 MG PO TABS: ORAL_TABLET | ORAL | 0 refills | Status: DC

## 2022-02-26 ENCOUNTER — Other Ambulatory Visit (HOSPITAL_BASED_OUTPATIENT_CLINIC_OR_DEPARTMENT_OTHER): Payer: Self-pay

## 2022-03-11 ENCOUNTER — Ambulatory Visit (HOSPITAL_BASED_OUTPATIENT_CLINIC_OR_DEPARTMENT_OTHER)
Admission: RE | Admit: 2022-03-11 | Discharge: 2022-03-11 | Disposition: A | Discharge status: Home / Self Care | Payer: 59 | Source: Ambulatory Visit | Attending: Family Medicine | Admitting: Family Medicine

## 2022-03-11 ENCOUNTER — Other Ambulatory Visit (HOSPITAL_BASED_OUTPATIENT_CLINIC_OR_DEPARTMENT_OTHER): Payer: Self-pay

## 2022-03-11 ENCOUNTER — Encounter (HOSPITAL_BASED_OUTPATIENT_CLINIC_OR_DEPARTMENT_OTHER): Payer: Self-pay

## 2022-03-11 ENCOUNTER — Other Ambulatory Visit: Payer: Self-pay | Admitting: Gastroenterology

## 2022-03-11 DIAGNOSIS — Z1231 Encounter for screening mammogram for malignant neoplasm of breast: Secondary | ICD-10-CM | POA: Insufficient documentation

## 2022-03-11 MED ORDER — PANTOPRAZOLE SODIUM 40 MG PO TBEC
40.0000 mg | DELAYED_RELEASE_TABLET | Freq: Every day | ORAL | 0 refills | Status: DC
  Filled 2022-03-11: qty 30, 30d supply, fill #0

## 2022-04-10 DIAGNOSIS — G43909 Migraine, unspecified, not intractable, without status migrainosus: Secondary | ICD-10-CM | POA: Insufficient documentation

## 2022-04-13 NOTE — Progress Notes (Addendum)
[place] at [facility] [address], [place], [place] [address] [phone]  Date:  04/22/2022   Name:  Amanda Maynard   DOB:  02-25-1960   MRN:  [id number]  PCP:  Darreld Mclean, MD    Chief Complaint: Annual Exam (Concerns/ questions: 1. depression with life's most recent events 2. More migraines/ headaches since depression/ stress has increased. 3. Neuropathy in hands and feet in the mornings. 4. ankle pain and stiffness. 5. Spasms/ sciatica /Flu shot today: did not reveice this year/)   History of Present Illness:  LATONGA Maynard is a 62 y.o. very pleasant female patient who presents with the following:  Patient seen today for physical exam and some other concerns. History of hypothyroidism, previous history of hepatitis C status post curative treatment. Most recent visit with myself was in September at which time she was having chest pain-very atypical, no evidence of CAD at that time.  We did get a coronary calcium-she did very well, score of 0  Patient is divorced, no children.  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  This is a significant undertaking. Also her mother was recently dx with recurrent breast cancer.  She is getting surgery next month This has been extremely upsetting for both Nami and her mom  Janaea is feeling depressed and anxious -mostly due to her mom's illness she thinks.  No SI however  Her ex-husband also just died of cirrhosis which was hard  She notes she is crying most days, tends to cry herself to sleep.  She notes when she finishes crying and lays on her left side she may have some discomfort in the left chest.  This is the only time she has the chest pain, it is not present with exercise She denies any suicidal ideation.  Mammogram up-to-date Colonoscopy done last year Some labs done in September-BMP, lipid, CBC, ferritin, TSH  Lexapro 5- she would like to go up on this  due to current life stressors Levothyroxine 75 She did not get a flu shot last year- she will skip this year as it is late in the season   In addition to her physical, Ezaria has several other concerns She has noted difficulty using her hands- they are very stiff in the am and it takes some time to get them working.  She notes she was denied for disability.  She was told by dermatology previously that she may have an autoimmune disorder of some sort. However, this is not yet been assessed with lab work per my knowledge.  I will begin lab work to look for any evidence of rheumatoid arthritis.  She would also be interested in seeing physical medicine rehab, they may be able to help with a more formal disability assessment  She notes worsening of her sciatica pain-this is not new.  She also notes tingling in her bilateral arms and legs when she first wakes up in the am - does not occur every day but she has noted it perhaps 4 days out of 7 for about 8 weeks It will take it about 5 minutes to go away when she moves her limbs around  She otherwise does not have this particular problem, no other neurologic changes  She did not get her flu shot this year but we decided to defer as the flu season is about over  Lab Results  Component Value Date   HGBA1C  5.3 10/25/2021    Patient Active Problem List   Diagnosis Date Noted   Hepatitis C antibody test positive 12/13/2020   Hypothyroidism (acquired) 12/13/2020    Past Medical History:  Diagnosis Date   Allergy    Anxiety    Arthritis    Chicken pox    Depression    Diverticulitis    Emphysema of lung ([place])    GERD (gastroesophageal reflux disease)    Headache    History of colon polyps    Jaundice    Lichen planopilaris    Thyroid disease    Urinary incontinence     Past Surgical History:  Procedure Laterality Date   ABDOMINAL HYSTERECTOMY  2012   APPENDECTOMY  2009   COLONOSCOPY  12/02/2016   Dr. Ulis Rias. Moderate  diverticulosis of the ascending colon, transverse colon, descending colon and sigmoid colon, increase in vascularity of the rectum. External hemorrhoids.   COLONOSCOPY  08/09/2013   Dr. Ulis Rias. Moderate diverticulosis of the hepatic flexure, transverse colon, splenic flexure, descending colon and sigmoid colon. Polyp (58mm) in the proximal sigmoid colon. (polypectomy). External hemorrhoids.   ESOPHAGOGASTRODUODENOSCOPY  08/09/2013   Dr. Ulis Rias. Normal stomach. Normal duodenum. Grade 1 esophagitis was seen in the lower third of the esophagus.    Social History   Tobacco Use   Smoking status: Every Day    Packs/day: 0.50    Years: 30.00    Additional pack years: 0.00    Total pack years: 15.00    Types: Cigarettes   Tobacco comments:    Have quit twice in 30 years but start again when stress level is too high  Substance Use Topics   Alcohol use: Yes    Alcohol/week: 6.0 standard drinks of alcohol    Types: 3 Cans of beer, 3 Standard drinks or equivalent per week   Drug use: Never    Family History  Problem Relation Age of Onset   Breast cancer Mother 41   Heart disease Mother    Hearing loss Mother    Depression Mother    Cancer Mother    Arthritis Mother    Cancer Father    Hyperlipidemia Father    Heart disease Maternal Grandmother    Cancer Maternal Grandfather    Arthritis Paternal Grandmother    Cancer Paternal Grandfather    Heart attack Paternal Grandfather    Esophageal cancer Neg Hx    Colon cancer Neg Hx    Stomach cancer Neg Hx    Rectal cancer Neg Hx     Allergies  Allergen Reactions   Molds & Smuts Shortness Of Breath   Sertraline     "Crazy dreams"    Sulfa Antibiotics Itching, Nausea Only, Rash and Nausea And Vomiting    Medication list has been reviewed and updated.  Current Outpatient Medications on File Prior to Visit  Medication Sig Dispense Refill   pantoprazole (PROTONIX) 40 MG tablet Take 1 tablet (40 mg total) by mouth daily. 30  tablet 0   SYNTHROID 75 MCG tablet Take 1 tablet (75 mcg total) by mouth daily before breakfast. 90 tablet 3   sucralfate (CARAFATE) 1 g tablet Take 1 tablet (1 g total) by mouth 4 (four) times daily. 124 tablet 0   No current facility-administered medications on file prior to visit.    Review of Systems:  As per HPI- otherwise negative.   Physical Examination: Vitals:   04/22/22 0911 04/22/22 0947  BP: (!) 142/70 112/70  Pulse: 80   Resp: 18   Temp: 97.7 F (36.5 C)   SpO2: 97%    Vitals:   04/22/22 0911  Weight: 197 lb 9.6 oz (89.6 kg)  Height: 5\' 3"  (1.6 m)   Body mass index is 35 kg/m. Ideal Body Weight: Weight in (lb) to have BMI = 25: 140.8  GEN: no acute distress. Mild obesity, looks well but intermittently tearful HEENT: Atraumatic, Normocephalic.  Bilateral TM wnl, oropharynx normal.  PEERL,EOMI.   Ears and Nose: No external deformity. CV: RRR, No M/G/R. No JVD. No thrill. No extra heart sounds. PULM: CTA B, no wheezes, crackles, rhonchi. No retractions. No resp. distress. No accessory muscle use. ABD: S, NT, ND EXTR: No c/c/e PSYCH: Normally interactive. Conversant.  Strong pulses in all extremities, feet are warm well-perfused Arthritic changes are present in both hands especially in the DIP joints.  She is missing some fingernails due to a dermatologic disorder  Assessment and Plan: Physical exam  Screening for diabetes mellitus - Plan: Comprehensive metabolic panel, Hemoglobin A1c  Hypothyroidism (acquired) - Plan: TSH  Thrombocytopenia ([place]) - Plan: CBC  Screening, lipid - Plan: Lipid panel  Fatigue, unspecified type - Plan: Ferritin, VITAMIN D 25 Hydroxy (Vit-D Deficiency, Fractures)  Mild depression - Plan: escitalopram (LEXAPRO) 10 MG tablet  Left-sided chest pain - Plan: DG Chest 2 View  Numbness and tingling in both hands - Plan: B12 and Folate Panel  Numbness and tingling of both feet - Plan: B12 and Folate Panel  Stiffness of joints  of both hands - Plan: Rheumatoid Factor, Antinuclear Antib (ANA), Cyclic citrul peptide antibody, IgG ([facility]), C-reactive protein, Sedimentation rate, Ambulatory referral to Orthopedic Surgery  Physical exam today.  Encouraged healthy diet and exercise routine Will plan further follow- up pending labs. She also has a few other concerns.  Will increase her Lexapro from 5 to 10 mg due to increased stress.  She will let me know how this is working for her, can increase further to 20 mg in a few weeks if needed Atypical pain in left chest only when she lays on her left side after she finishes crying.  Recent coronary calcium score of 0.  Will obtain a chest x-ray Numbness and tingling of both arms/hands and legs/feet in the morning for a few weeks.  Will obtain B12 and folate, ferritin Stiffness in both hands causing limited use and mobility.  Lab work pending as above to evaluate for rheumatoid arthritis.  Referral made to physical medicine and rehab  Signed Lamar Blinks, MD Received her chest film, message to pt  DG Chest 2 View  Result Date: 04/22/2022 CLINICAL DATA:  Chest pain EXAM: CHEST - 2 VIEW COMPARISON:  None Available. FINDINGS: The heart size and mediastinal contours are within normal limits. Both lungs are clear. The visualized skeletal structures are unremarkable except for thoracic spine endplate degenerative changes and slight dextrocurvature. Trachea midline. IMPRESSION: No active cardiopulmonary disease. Electronically Signed   By: Jerilynn Mages.  Shick M.D.   On: 04/22/2022 10:29    Received lab results as below, message to patient Results for orders placed or performed in visit on 04/22/22  CBC  Result Value Ref Range   WBC 3.5 (L) 4.0 - 10.5 K/uL   RBC 4.49 3.87 - 5.11 Mil/uL   Platelets 90.0 (L) 150.0 - 400.0 K/uL   Hemoglobin 12.7 12.0 - 15.0 g/dL   HCT 38.8 36.0 - 46.0 %   MCV 86.4 78.0 - 100.0 fl   MCHC  32.9 30.0 - 36.0 g/dL   RDW 14.9 11.5 - 15.5 %  Comprehensive metabolic  panel  Result Value Ref Range   Sodium 140 135 - 145 mEq/L   Potassium 4.1 3.5 - 5.1 mEq/L   Chloride 104 96 - 112 mEq/L   CO2 27 19 - 32 mEq/L   Glucose, Bld 100 (H) 70 - 99 mg/dL   BUN 14 6 - 23 mg/dL   Creatinine, Ser 0.79 0.40 - 1.20 mg/dL   Total Bilirubin 1.3 (H) 0.2 - 1.2 mg/dL   Alkaline Phosphatase 53 39 - 117 U/L   AST 19 0 - 37 U/L   ALT 12 0 - 35 U/L   Total Protein 7.0 6.0 - 8.3 g/dL   Albumin 4.3 3.5 - 5.2 g/dL   GFR 80.26 >60.00 mL/min   Calcium 9.7 8.4 - 10.5 mg/dL  Hemoglobin A1c  Result Value Ref Range   Hgb A1c MFr Bld 5.3 4.6 - 6.5 %  Lipid panel  Result Value Ref Range   Cholesterol 199 0 - 200 mg/dL   Triglycerides 121.0 0.0 - 149.0 mg/dL   HDL 55.90 >39.00 mg/dL   VLDL 24.2 0.0 - 40.0 mg/dL   LDL Cholesterol 119 (H) 0 - 99 mg/dL   Total CHOL/HDL Ratio 4    NonHDL 142.85   TSH  Result Value Ref Range   TSH 1.55 0.35 - 5.50 uIU/mL  Ferritin  Result Value Ref Range   Ferritin 17.2 10.0 - 291.0 ng/mL  VITAMIN D 25 Hydroxy (Vit-D Deficiency, Fractures)  Result Value Ref Range   VITD 23.65 (L) 30.00 - 100.00 ng/mL  C-reactive protein  Result Value Ref Range   CRP <1.0 0.5 - 20.0 mg/dL  Sedimentation rate  Result Value Ref Range   Sed Rate 18 0 - 30 mm/hr  B12 and Folate Panel  Result Value Ref Range   Vitamin B-12 254 211 - 911 pg/mL   Folate 12.5 >5.9 ng/mL

## 2022-04-13 NOTE — Patient Instructions (Addendum)
It was great to see again today, I will be in touch with your labs soon as possible Let's increase lexapro to 10 mg- let me know how you respond, we can go up to 20 after a few weeks if needed Please stop by the imaging dept on the ground floor for a chest film I will also refer you to "physical medicine and rehab," at orthopedics office, to look at the function of your hands

## 2022-04-22 ENCOUNTER — Encounter: Payer: Self-pay | Admitting: Family Medicine

## 2022-04-22 ENCOUNTER — Ambulatory Visit (INDEPENDENT_AMBULATORY_CARE_PROVIDER_SITE_OTHER): Payer: 59 | Admitting: Family Medicine

## 2022-04-22 ENCOUNTER — Ambulatory Visit (HOSPITAL_BASED_OUTPATIENT_CLINIC_OR_DEPARTMENT_OTHER)
Admission: RE | Admit: 2022-04-22 | Discharge: 2022-04-22 | Disposition: A | Discharge status: Home / Self Care | Payer: 59 | Source: Ambulatory Visit | Attending: Family Medicine | Admitting: Family Medicine

## 2022-04-22 ENCOUNTER — Other Ambulatory Visit (HOSPITAL_BASED_OUTPATIENT_CLINIC_OR_DEPARTMENT_OTHER): Payer: Self-pay

## 2022-04-22 VITALS — BP 112/70 | HR 80 | Temp 97.7°F | Resp 18 | Ht 63.0 in | Wt 197.6 lb

## 2022-04-22 DIAGNOSIS — Z13220 Encounter for screening for lipoid disorders: Secondary | ICD-10-CM

## 2022-04-22 DIAGNOSIS — R200 Anesthesia of skin: Secondary | ICD-10-CM | POA: Diagnosis not present

## 2022-04-22 DIAGNOSIS — E039 Hypothyroidism, unspecified: Secondary | ICD-10-CM

## 2022-04-22 DIAGNOSIS — F32A Depression, unspecified: Secondary | ICD-10-CM

## 2022-04-22 DIAGNOSIS — Z131 Encounter for screening for diabetes mellitus: Secondary | ICD-10-CM

## 2022-04-22 DIAGNOSIS — R079 Chest pain, unspecified: Secondary | ICD-10-CM

## 2022-04-22 DIAGNOSIS — M25641 Stiffness of right hand, not elsewhere classified: Secondary | ICD-10-CM | POA: Diagnosis not present

## 2022-04-22 DIAGNOSIS — R5383 Other fatigue: Secondary | ICD-10-CM | POA: Diagnosis not present

## 2022-04-22 DIAGNOSIS — Z0000 Encounter for general adult medical examination without abnormal findings: Secondary | ICD-10-CM | POA: Diagnosis not present

## 2022-04-22 DIAGNOSIS — M25642 Stiffness of left hand, not elsewhere classified: Secondary | ICD-10-CM | POA: Diagnosis not present

## 2022-04-22 DIAGNOSIS — R202 Paresthesia of skin: Secondary | ICD-10-CM | POA: Diagnosis not present

## 2022-04-22 DIAGNOSIS — D696 Thrombocytopenia, unspecified: Secondary | ICD-10-CM | POA: Diagnosis not present

## 2022-04-22 LAB — LIPID PANEL
Cholesterol: 199 mg/dL (ref 0–200)
HDL: 55.9 mg/dL (ref >39.00)
LDL Cholesterol: 119 mg/dL — ABNORMAL HIGH (ref 0–99)
NonHDL: 142.85
Total CHOL/HDL Ratio: 4
Triglycerides: 121 mg/dL (ref 0.0–149.0)
VLDL: 24.2 mg/dL (ref 0.0–40.0)

## 2022-04-22 LAB — COMPREHENSIVE METABOLIC PANEL
ALT: 12 U/L (ref 0–35)
AST: 19 U/L (ref 0–37)
Albumin: 4.3 g/dL (ref 3.5–5.2)
Alkaline Phosphatase: 53 U/L (ref 39–117)
BUN: 14 mg/dL (ref 6–23)
CO2: 27 mEq/L (ref 19–32)
Calcium: 9.7 mg/dL (ref 8.4–10.5)
Chloride: 104 mEq/L (ref 96–112)
Creatinine, Ser: 0.79 mg/dL (ref 0.40–1.20)
GFR: 80.26 mL/min (ref >60.00)
Glucose, Bld: 100 mg/dL — ABNORMAL HIGH (ref 70–99)
Potassium: 4.1 mEq/L (ref 3.5–5.1)
Sodium: 140 mEq/L (ref 135–145)
Total Bilirubin: 1.3 mg/dL — ABNORMAL HIGH (ref 0.2–1.2)
Total Protein: 7 g/dL (ref 6.0–8.3)

## 2022-04-22 LAB — VITAMIN D 25 HYDROXY (VIT D DEFICIENCY, FRACTURES): VITD: 23.65 ng/mL — ABNORMAL LOW (ref 30.00–100.00)

## 2022-04-22 LAB — B12 AND FOLATE PANEL
Folate: 12.5 ng/mL (ref >5.9)
Vitamin B-12: 254 pg/mL (ref 211–911)

## 2022-04-22 LAB — FERRITIN: Ferritin: 17.2 ng/mL (ref 10.0–291.0)

## 2022-04-22 LAB — TSH: TSH: 1.55 u[IU]/mL (ref 0.35–5.50)

## 2022-04-22 LAB — SEDIMENTATION RATE: Sed Rate: 18 mm/hr (ref 0–30)

## 2022-04-22 LAB — CBC
HCT: 38.8 % (ref 36.0–46.0)
Hemoglobin: 12.7 g/dL (ref 12.0–15.0)
MCHC: 32.9 g/dL (ref 30.0–36.0)
MCV: 86.4 fl (ref 78.0–100.0)
Platelets: 90 10*3/uL — ABNORMAL LOW (ref 150.0–400.0)
RBC: 4.49 Mil/uL (ref 3.87–5.11)
RDW: 14.9 % (ref 11.5–15.5)
WBC: 3.5 10*3/uL — ABNORMAL LOW (ref 4.0–10.5)

## 2022-04-22 LAB — C-REACTIVE PROTEIN: CRP: <1 mg/dL (ref 0.5–20.0)

## 2022-04-22 LAB — HEMOGLOBIN A1C: Hgb A1c MFr Bld: 5.3 % (ref 4.6–6.5)

## 2022-04-22 MED ORDER — ESCITALOPRAM OXALATE 10 MG PO TABS
10.0000 mg | ORAL_TABLET | Freq: Every day | ORAL | 3 refills | Status: DC
  Filled 2022-04-22: qty 30, 30d supply, fill #0
  Filled 2022-05-25: qty 30, 30d supply, fill #1
  Filled 2022-07-03: qty 30, 30d supply, fill #2
  Filled 2022-07-29: qty 30, 30d supply, fill #3

## 2022-04-24 LAB — ANA: Anti Nuclear Antibody (ANA): NEGATIVE

## 2022-04-24 LAB — CYCLIC CITRUL PEPTIDE ANTIBODY, IGG: Cyclic Citrullin Peptide Ab: <16 UNITS

## 2022-04-24 LAB — RHEUMATOID FACTOR: Rheumatoid fact SerPl-aCnc: 18 IU/mL — ABNORMAL HIGH (ref <14)

## 2022-04-25 ENCOUNTER — Encounter: Payer: Self-pay | Admitting: Family Medicine

## 2022-04-25 DIAGNOSIS — M79641 Pain in right hand: Secondary | ICD-10-CM

## 2022-04-30 ENCOUNTER — Ambulatory Visit (HOSPITAL_BASED_OUTPATIENT_CLINIC_OR_DEPARTMENT_OTHER)
Admission: RE | Admit: 2022-04-30 | Discharge: 2022-04-30 | Disposition: A | Discharge status: Home / Self Care | Payer: 59 | Source: Ambulatory Visit | Attending: Family Medicine | Admitting: Family Medicine

## 2022-04-30 ENCOUNTER — Encounter: Payer: Self-pay | Admitting: Family Medicine

## 2022-04-30 DIAGNOSIS — M79642 Pain in left hand: Secondary | ICD-10-CM | POA: Diagnosis not present

## 2022-04-30 DIAGNOSIS — M154 Erosive (osteo)arthritis: Secondary | ICD-10-CM

## 2022-04-30 DIAGNOSIS — M79641 Pain in right hand: Secondary | ICD-10-CM | POA: Insufficient documentation

## 2022-05-06 DIAGNOSIS — M79642 Pain in left hand: Secondary | ICD-10-CM | POA: Insufficient documentation

## 2022-05-10 ENCOUNTER — Other Ambulatory Visit (HOSPITAL_BASED_OUTPATIENT_CLINIC_OR_DEPARTMENT_OTHER): Payer: Self-pay

## 2022-05-10 DIAGNOSIS — M19042 Primary osteoarthritis, left hand: Secondary | ICD-10-CM | POA: Insufficient documentation

## 2022-05-10 DIAGNOSIS — M13841 Other specified arthritis, right hand: Secondary | ICD-10-CM | POA: Diagnosis not present

## 2022-05-10 DIAGNOSIS — M13842 Other specified arthritis, left hand: Secondary | ICD-10-CM | POA: Diagnosis not present

## 2022-05-10 MED ORDER — MELOXICAM 7.5 MG PO TABS
7.5000 mg | ORAL_TABLET | Freq: Every day | ORAL | 0 refills | Status: DC
  Filled 2022-05-10: qty 30, 30d supply, fill #0

## 2022-05-27 ENCOUNTER — Other Ambulatory Visit (HOSPITAL_BASED_OUTPATIENT_CLINIC_OR_DEPARTMENT_OTHER): Payer: Self-pay

## 2022-06-19 ENCOUNTER — Encounter (INDEPENDENT_AMBULATORY_CARE_PROVIDER_SITE_OTHER): Payer: 59 | Admitting: Family Medicine

## 2022-06-19 DIAGNOSIS — J0110 Acute frontal sinusitis, unspecified: Secondary | ICD-10-CM

## 2022-06-20 ENCOUNTER — Other Ambulatory Visit (HOSPITAL_BASED_OUTPATIENT_CLINIC_OR_DEPARTMENT_OTHER): Payer: Self-pay

## 2022-06-20 DIAGNOSIS — J0110 Acute frontal sinusitis, unspecified: Secondary | ICD-10-CM

## 2022-06-20 MED ORDER — PREDNISONE 20 MG PO TABS
ORAL_TABLET | ORAL | 0 refills | Status: AC
  Filled 2022-06-20: qty 9, 6d supply, fill #0

## 2022-06-20 MED ORDER — AMOXICILLIN-POT CLAVULANATE 875-125 MG PO TABS
1.0000 | ORAL_TABLET | Freq: Two times a day (BID) | ORAL | 0 refills | Status: DC
  Filled 2022-06-20: qty 14, 7d supply, fill #0

## 2022-06-20 NOTE — Telephone Encounter (Signed)
Please see the MyChart message reply(ies) for my assessment and plan.  The patient gave consent for this Medical Advice Message and is aware that it may result in a bill to their insurance company as well as the possibility that this may result in a co-payment or deductible. They are an established patient, but are not seeking medical advice exclusively about a problem treated during an in person or video visit in the last 7 days. I did not recommend an in person or video visit within 7 days of my reply.  I spent a total of 15 minutes cumulative time within 7 days through MyChart messaging Perian Tedder, MD  

## 2022-06-20 NOTE — Addendum Note (Signed)
Addended by: Abbe Amsterdam C on: 06/20/2022 12:54 PM   Modules accepted: Orders

## 2022-06-25 DIAGNOSIS — Z87892 Personal history of anaphylaxis: Secondary | ICD-10-CM | POA: Diagnosis not present

## 2022-06-25 DIAGNOSIS — K219 Gastro-esophageal reflux disease without esophagitis: Secondary | ICD-10-CM | POA: Diagnosis not present

## 2022-06-25 DIAGNOSIS — Z882 Allergy status to sulfonamides status: Secondary | ICD-10-CM | POA: Diagnosis not present

## 2022-06-25 DIAGNOSIS — Z720 Tobacco use: Secondary | ICD-10-CM | POA: Diagnosis not present

## 2022-06-25 DIAGNOSIS — Z803 Family history of malignant neoplasm of breast: Secondary | ICD-10-CM | POA: Diagnosis not present

## 2022-06-25 DIAGNOSIS — M1990 Unspecified osteoarthritis, unspecified site: Secondary | ICD-10-CM | POA: Diagnosis not present

## 2022-06-25 DIAGNOSIS — D696 Thrombocytopenia, unspecified: Secondary | ICD-10-CM | POA: Diagnosis not present

## 2022-06-25 DIAGNOSIS — I10 Essential (primary) hypertension: Secondary | ICD-10-CM | POA: Diagnosis not present

## 2022-06-25 DIAGNOSIS — F329 Major depressive disorder, single episode, unspecified: Secondary | ICD-10-CM | POA: Diagnosis not present

## 2022-06-25 DIAGNOSIS — F419 Anxiety disorder, unspecified: Secondary | ICD-10-CM | POA: Diagnosis not present

## 2022-06-25 DIAGNOSIS — Z791 Long term (current) use of non-steroidal anti-inflammatories (NSAID): Secondary | ICD-10-CM | POA: Diagnosis not present

## 2022-06-25 DIAGNOSIS — R32 Unspecified urinary incontinence: Secondary | ICD-10-CM | POA: Diagnosis not present

## 2022-07-18 DIAGNOSIS — B070 Plantar wart: Secondary | ICD-10-CM | POA: Diagnosis not present

## 2022-07-18 DIAGNOSIS — D485 Neoplasm of uncertain behavior of skin: Secondary | ICD-10-CM | POA: Diagnosis not present

## 2022-07-18 DIAGNOSIS — L433 Subacute (active) lichen planus: Secondary | ICD-10-CM | POA: Diagnosis not present

## 2022-07-18 DIAGNOSIS — L821 Other seborrheic keratosis: Secondary | ICD-10-CM | POA: Diagnosis not present

## 2022-07-18 DIAGNOSIS — L661 Lichen planopilaris: Secondary | ICD-10-CM | POA: Diagnosis not present

## 2022-07-18 DIAGNOSIS — D1801 Hemangioma of skin and subcutaneous tissue: Secondary | ICD-10-CM | POA: Diagnosis not present

## 2022-08-07 ENCOUNTER — Encounter: Payer: Self-pay | Admitting: Dermatology

## 2022-08-26 ENCOUNTER — Other Ambulatory Visit: Payer: Self-pay | Admitting: Family Medicine

## 2022-08-26 ENCOUNTER — Other Ambulatory Visit: Payer: Self-pay

## 2022-08-26 ENCOUNTER — Other Ambulatory Visit (HOSPITAL_BASED_OUTPATIENT_CLINIC_OR_DEPARTMENT_OTHER): Payer: Self-pay

## 2022-08-26 DIAGNOSIS — F32A Depression, unspecified: Secondary | ICD-10-CM

## 2022-08-26 MED ORDER — ESCITALOPRAM OXALATE 10 MG PO TABS
10.0000 mg | ORAL_TABLET | Freq: Every day | ORAL | 3 refills | Status: DC
  Filled 2022-08-26: qty 30, 30d supply, fill #0
  Filled 2022-09-18: qty 30, 30d supply, fill #1
  Filled 2022-10-29: qty 30, 30d supply, fill #2
  Filled 2022-11-26: qty 30, 30d supply, fill #3

## 2022-09-19 ENCOUNTER — Other Ambulatory Visit (HOSPITAL_BASED_OUTPATIENT_CLINIC_OR_DEPARTMENT_OTHER): Payer: Self-pay

## 2022-09-19 ENCOUNTER — Other Ambulatory Visit: Payer: Self-pay

## 2022-10-02 NOTE — Progress Notes (Signed)
Office Visit Note  Patient: Amanda Maynard             Date of Birth: [birth year]           MRN: [id number]             PCP: Pearline Cables, MD Referring: Pearline Cables, MD Visit Date: 10/16/2022 Occupation: @GUAROCC @  Subjective:  Pain in multiple joints  History of Present Illness: Amanda Maynard is a 62 y.o. female seen in consultation per request of Dr. Patsy Lager for the evaluation of joint pain and positive rheumatoid factor.  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  She recalls that more than 25 years ago she started having pain in her lower back.  At the time she was going to pain management and having cortisone injections.  She was also offered narcotics but she declined.  She had been taking NSAIDs on a as needed basis.  She has also had upper back pain for the last many years.  She gives history of occasional discomfort in her shoulders when she is sleeping on her side.  She has neck discomfort.  She has been having pain and discomfort in the bilateral hands for the last 5 years.  She has noticed knots on her fingers.  She also gives history of discomfort in her hips which she points to trochanteric region.  She states she has discomfort climbing stairs due to pain in her knee joints.  There is no history of joint swelling.  She has been trying weight loss and had intentional weight loss of 10 pounds over the last 6 months.  She has modified her diet.  She has been also experiencing tingling and numbness in her hands and her feet.  She was diagnosed with hepatitis C in 2008.  Patient states she was treated for hepatitis C several years ago.  She has been under care of Dr. Barron Alvine.  Her mother has osteoarthritis.  She recalls that her paternal grandmother had rheumatism.  She is gravida 0, para 0.  No history of DVTs.  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  She states it became difficult for her to type anymore due to arthritis  in her fingers and loss of nails due to lichen planus.  She also has chronic discomfort in her neck upper back and lower back which makes it difficult for her to sit for more than an hour.  She has difficulty standing and walking.  She states due to pain and discomfort in multiple joints she is unable to do any job.  She is applying for disability.  She tries to walk for exercise.  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  Patient states she was evaluated by Dr. Delfina Redwood and was diagnosed with lichen planus.  She has tried multiple medications and cortisone injections in her scalp without much results.    Activities of Daily Living:  Patient reports morning stiffness for 30 minutes.   Patient Reports nocturnal pain.  Difficulty dressing/grooming: Denies Difficulty climbing stairs: Denies Difficulty getting out of chair: Denies Difficulty using hands for taps, buttons, cutlery, and/or writing: Reports  Review of Systems  Constitutional:  Positive for fatigue.  HENT:  Positive for mouth dryness. Negative for mouth sores.   Eyes:  Negative for dryness.  Respiratory:  Negative for shortness of breath.   Cardiovascular:  Negative for chest pain  and palpitations.  Gastrointestinal:  Negative for blood in stool, constipation and diarrhea.  Endocrine: Negative for increased urination.  Genitourinary:  Negative for involuntary urination.  Musculoskeletal:  Positive for joint pain, gait problem, joint pain, joint swelling, myalgias, muscle weakness, morning stiffness, muscle tenderness and myalgias.  Skin:  Positive for hair loss. Negative for color change, rash and sensitivity to sunlight.  Allergic/Immunologic: Negative for susceptible to infections.  Neurological:  Positive for dizziness, numbness and headaches.  Hematological:  Negative for swollen glands.  Psychiatric/Behavioral:  Positive for depressed mood and sleep disturbance. The patient is  nervous/anxious.     PMFS History:  Patient Active Problem List   Diagnosis Date Noted   Hepatitis C antibody test positive 12/13/2020   Hypothyroidism (acquired) 12/13/2020    Past Medical History:  Diagnosis Date   Allergy    Anxiety    Arthritis    Chicken pox    Depression    Diverticulitis    Emphysema of lung (HCC)    GERD (gastroesophageal reflux disease)    Headache    History of colon polyps    Jaundice    Lichen planopilaris    Thyroid disease    Urinary incontinence     Family History  Problem Relation Age of Onset   Breast cancer Mother 26   Heart disease Mother    Hearing loss Mother    Depression Mother    Cancer Mother    Arthritis Mother    Cancer Father    Hyperlipidemia Father    Depression Sister    Depression Sister    Heart disease Sister    Heart disease Maternal Grandmother    Cancer Maternal Grandfather    Arthritis Paternal Grandmother    Rheum arthritis Paternal Grandmother    Cancer Paternal Grandfather    Heart attack Paternal Grandfather    Esophageal cancer Neg Hx    Colon cancer Neg Hx    Stomach cancer Neg Hx    Rectal cancer Neg Hx    Past Surgical History:  Procedure Laterality Date   ABDOMINAL HYSTERECTOMY  2012   APPENDECTOMY  2009   COLONOSCOPY  12/02/2016   Dr. Vista Mink. Moderate diverticulosis of the ascending colon, transverse colon, descending colon and sigmoid colon, increase in vascularity of the rectum. External hemorrhoids.   COLONOSCOPY  08/09/2013   Dr. Vista Mink. Moderate diverticulosis of the hepatic flexure, transverse colon, splenic flexure, descending colon and sigmoid colon. Polyp (7mm) in the proximal sigmoid colon. (polypectomy). External hemorrhoids.   ESOPHAGOGASTRODUODENOSCOPY  08/09/2013   Dr. Vista Mink. Normal stomach. Normal duodenum. Grade 1 esophagitis was seen in the lower third of the esophagus.   Social History   Social History Narrative   Not on file   Immunization History   Administered Date(s) Administered   PFIZER(Purple Top)SARS-COV-2 Vaccination 05/13/2019, 06/09/2019   Tdap 04/18/2021   Zoster Recombinant(Shingrix) 12/13/2020, 04/18/2021     Objective: Vital Signs: BP 104/74 (BP Location: Right Arm, Patient Position: Sitting, Cuff Size: Normal)   Pulse 89   Resp 17   Ht 5\' 3"  (1.6 m)   Wt 192 lb 12.8 oz (87.5 kg)   BMI 34.15 kg/m    Physical Exam Vitals and nursing note reviewed.  Constitutional:      Appearance: She is well-developed.  HENT:     Head: Normocephalic and atraumatic.  Eyes:     Conjunctiva/sclera: Conjunctivae normal.  Cardiovascular:     Rate and Rhythm: Normal rate and regular rhythm.  Heart sounds: Normal heart sounds.  Pulmonary:     Effort: Pulmonary effort is normal.     Breath sounds: Normal breath sounds.  Abdominal:     General: Bowel sounds are normal.     Palpations: Abdomen is soft.  Musculoskeletal:     Cervical back: Normal range of motion.  Lymphadenopathy:     Cervical: No cervical adenopathy.  Skin:    General: Skin is warm and dry.     Capillary Refill: Capillary refill takes less than 2 seconds.     Comments: She has been patchy alopecia on her scalp.  She also had nail dystrophy and loss of nails.  Neurological:     Mental Status: She is alert and oriented to person, place, and time.  Psychiatric:        Behavior: Behavior normal.     Musculoskeletal Exam: She good range of motion of the cervical spine with some discomfort.  She had discomfort with range of motion of her thoracic and lumbar spine without any point tenderness.  There was no SI joint tenderness.  Shoulder joints, elbow joints, wrist joints, MCPs PIPs and DIPs were in good range of motion.  She had bilateral PIP and DIP thickening with no synovitis.  She has lost several of her nails.  Hip joints and knee joints were in good range of motion.  She had discomfort range of motion of bilateral hip joints and knee joints without any  warmth swelling or effusion.  There was no tenderness over ankles or MTPs.  No synovitis was noted.  CDAI Exam: CDAI Score: -- Patient Global: --; Provider Global: -- Swollen: --; Tender: -- Joint Exam 10/16/2022   No joint exam has been documented for this visit   There is currently no information documented on the homunculus. Go to the Rheumatology activity and complete the homunculus joint exam.  Investigation: No additional findings.  Imaging: No results found.  Recent Labs: Lab Results  Component Value Date   WBC 3.5 (L) 04/22/2022   HGB 12.7 04/22/2022   PLT 90.0 (L) 04/22/2022   NA 140 04/22/2022   K 4.1 04/22/2022   CL 104 04/22/2022   CO2 27 04/22/2022   GLUCOSE 100 (H) 04/22/2022   BUN 14 04/22/2022   CREATININE 0.79 04/22/2022   BILITOT 1.3 (H) 04/22/2022   ALKPHOS 53 04/22/2022   AST 19 04/22/2022   ALT 12 04/22/2022   PROT 7.0 04/22/2022   ALBUMIN 4.3 04/22/2022   CALCIUM 9.7 04/22/2022   April 22, 2022 B12 normal, folate normal, hemoglobin A1c 5.3, TSH normal, ferritin 17.2, ANA negative, RF 18, anti-CCP negative, CRP<1.0, ESR 18, vitamin D23.65   IMPRESSION: 1. 3 mm anterolisthesis of L4 versus L5. 2. Lower lumbar facet degenerative change. 3. Minimal calcified atherosclerosis in the abdominal aorta.  Electronically Signed   By: Gerome Sam III M.D.   On: 04/20/2021 13:44  - 04/02/2017: CT abdomen/pelvis: Mildly enlarged liver measuring 18 cm. Nodular contour is again seen, compatible with cirrhosis. Normal attenuation. No liver lesion identified. Mild contraction of the GB with mild wall thickening of 4 mm. No pericholecystic fluid, gallstones, sludge. No duct dilation. Enlarged spleen measuring 17.3 cm. A few tiny splenic cysts. Normal pancreas without PD dilation. Focal enteritis of the loop of jejunum within the central abdomen and the terminal ileum. Mild portal hypertension with splenomegaly, enlarged main portal vein, enlarged splenic vein    Speciality Comments: No specialty comments available.  Procedures:  No procedures performed Allergies:  Molds & smuts, Sertraline, and Sulfa antibiotics   Assessment / Plan:     Visit Diagnoses: Pain in both hands -she complains of pain and discomfort in her bilateral hands for last several years which has been progressively getting worse.  She has noticed knots over her fingers.  On examination today hypertensive Bouchard's nodes were noted.  No synovitis was noted.  There was no involvement of MCP joints or wrist joints.  The clinical findings were consistent with osteoarthritis.  April 30, 2022 x-rays of bilateral hands reviewed by me personally showed bilateral CMC PIP and DIP narrowing.  I offered ultrasound of bilateral hands to look for synovitis as I did not see any synovitis on the clinical examination but patient declined.  A handout on muscle strengthening exercises was given.  Joint protection muscle strengthening was discussed.  Rheumatoid factor positive -patient had no synovitis on the examination.  Positive rheumatoid factor is most likely due to positive hepatitis C.  Chronic pain of both hips -she complains of discomfort in the bilateral hip joints.  She discomfort range of motion.  Plan: XR HIPS BILAT W OR W/O PELVIS 3-4 VIEWS.  X-rays of bilateral hip joints were unremarkable.  Chronic pain of both knees -she complains of pain and discomfort in her knee joints.  She has difficulty climbing stairs and walking.  No warmth swelling or effusion was noted.  Plan: XR KNEE 3 VIEW RIGHT, XR KNEE 3 VIEW LEFT.  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  A handout on lower extremity exercises was given.  Paresthesia of both hands - B12 and folate normal.  Paresthesia of both feet-patient states that she has been diagnosed with peripheral neuropathy.  Neck pain-she complains of pain and stiffness in her cervical spine.  I do not have any x-rays to review.   Patient has been seeing a [facility].  A handout on neck exercises was given.  DDD (degenerative disc disease), lumbar -she gives history of lower back pain for more than 25 years.  She used to get injections by pain management 20 years ago.  She has tried NSAIDs in the past.  Now she has been seeing a chiropractor on a weekly basis.  04/21/22 x-rays of lumbar spine showed degenerative disc disease and facet joint arthropathy.  She has not been doing any exercises at home.  Core strengthening exercises were discussed.  A handout on back exercises was given.  Polyarthralgia-patient gives history of pain and discomfort in multiple joints.  Patient states that she is applying for disability due to unable to perform her job.  She states due to severe arthritis in her hands and loss of nail she is unable to type and grip objects.  She is unable to sit for prolonged time due to arthritis in her lower back and upper back.  She is unable to stand for long time and also has significant discomfort and walking.  Hepatitis C antibody test positive -patient was diagnosed with hepatitis C many years ago.  Patient states she was treated for hepatitis C.  She has been followed by gastroenterology.  November 01, 2016 hep C RNA quant positive  Other cirrhosis of liver (HCC) - Followed by Dr. Bethann Berkshire.  Last visit April 27, 2021.  Hypothyroidism (acquired)  Thrombocytopenia (HCC)-longstanding history of thrombocytopenia per patient.  Vitamin D deficiency  Lichen planus-she is followed by Dr. Delfina Redwood.  Patient has alopecia and nail dystrophy and loss of nails in her hands and feet.  Patient  has tried multiple medications without any results.  She also had intradermal injection on her scalp without success.  BMI 34.15-patient reports 10 pounds of intentional weight loss over the last 6 months.  She is trying dietary modifications.  She has been also trying to walk for weight loss.  Orders: Orders Placed This  Encounter  Procedures   XR HIPS BILAT W OR W/O PELVIS 3-4 VIEWS   XR KNEE 3 VIEW RIGHT   XR KNEE 3 VIEW LEFT   No orders of the defined types were placed in this encounter.    Follow-Up Instructions: Return for Positive RF, polyarthralgia.   Pollyann Savoy, MD  Note - This record has been created using Animal nutritionist.  Chart creation errors have been sought, but may not always  have been located. Such creation errors do not reflect on  the standard of medical care.Pain in both hands

## 2022-10-09 ENCOUNTER — Encounter: Payer: 59 | Admitting: Rheumatology

## 2022-10-16 ENCOUNTER — Ambulatory Visit (INDEPENDENT_AMBULATORY_CARE_PROVIDER_SITE_OTHER): Payer: 59

## 2022-10-16 ENCOUNTER — Ambulatory Visit: Payer: 59 | Attending: Rheumatology | Admitting: Rheumatology

## 2022-10-16 ENCOUNTER — Ambulatory Visit: Payer: 59

## 2022-10-16 ENCOUNTER — Encounter: Payer: Self-pay | Admitting: Rheumatology

## 2022-10-16 VITALS — BP 104/74 | HR 89 | Resp 17 | Ht 63.0 in | Wt 192.8 lb

## 2022-10-16 DIAGNOSIS — R202 Paresthesia of skin: Secondary | ICD-10-CM

## 2022-10-16 DIAGNOSIS — G8929 Other chronic pain: Secondary | ICD-10-CM

## 2022-10-16 DIAGNOSIS — M25561 Pain in right knee: Secondary | ICD-10-CM

## 2022-10-16 DIAGNOSIS — E559 Vitamin D deficiency, unspecified: Secondary | ICD-10-CM

## 2022-10-16 DIAGNOSIS — M25551 Pain in right hip: Secondary | ICD-10-CM | POA: Diagnosis not present

## 2022-10-16 DIAGNOSIS — M5136 Other intervertebral disc degeneration, lumbar region: Secondary | ICD-10-CM

## 2022-10-16 DIAGNOSIS — M25552 Pain in left hip: Secondary | ICD-10-CM

## 2022-10-16 DIAGNOSIS — M79641 Pain in right hand: Secondary | ICD-10-CM | POA: Diagnosis not present

## 2022-10-16 DIAGNOSIS — M79642 Pain in left hand: Secondary | ICD-10-CM

## 2022-10-16 DIAGNOSIS — E039 Hypothyroidism, unspecified: Secondary | ICD-10-CM

## 2022-10-16 DIAGNOSIS — Z6834 Body mass index (BMI) 34.0-34.9, adult: Secondary | ICD-10-CM

## 2022-10-16 DIAGNOSIS — L439 Lichen planus, unspecified: Secondary | ICD-10-CM

## 2022-10-16 DIAGNOSIS — K7469 Other cirrhosis of liver: Secondary | ICD-10-CM

## 2022-10-16 DIAGNOSIS — D696 Thrombocytopenia, unspecified: Secondary | ICD-10-CM | POA: Diagnosis not present

## 2022-10-16 DIAGNOSIS — M542 Cervicalgia: Secondary | ICD-10-CM

## 2022-10-16 DIAGNOSIS — M25562 Pain in left knee: Secondary | ICD-10-CM

## 2022-10-16 DIAGNOSIS — R768 Other specified abnormal immunological findings in serum: Secondary | ICD-10-CM

## 2022-10-16 DIAGNOSIS — M2550 Pain in unspecified joint: Secondary | ICD-10-CM

## 2022-10-16 NOTE — Progress Notes (Deleted)
Office Visit Note   Patient: Amanda Maynard                                                                         Date of Birth: 11/26/1960                                                             MRN: [id number]                                                                                       PCP: Pearline Cables, MD Referring: Pearline Cables, MD Visit Date: 10/16/2022 Occupation: @GUAROCC @   Subjective:  Pain in multiple joints   History of Present Illness: Amanda Maynard is a 62 y.o. female seen in consultation per request of Dr. Patsy Lager for the evaluation of joint pain and positive rheumatoid factor.  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  She recalls that more than 25 years ago she started having pain in her lower back.  At the time she was going to pain management and having cortisone injections.  She was also offered narcotics but she declined.  She had been taking NSAIDs on a as needed basis.  She has also had upper back pain for the last many years.  She gives history of occasional discomfort in her shoulders when she is sleeping on her side.  She has neck discomfort.  She has been having pain and discomfort in the bilateral hands for the last 5 years.  She has noticed knots on her fingers.  She also gives history of discomfort in her hips which she points to trochanteric region.  She states she has discomfort climbing stairs due to pain in her knee joints.  There is no history of joint swelling.  She has been trying weight loss and had intentional weight loss of 10 pounds over the last 6 months.  She has modified her diet.  She has been also experiencing tingling and numbness in her hands and her feet.  She was diagnosed with hepatitis C in 2008.  Patient states she was treated for hepatitis C several years ago.  She has been under care of Dr. Barron Alvine.  Her mother has osteoarthritis.  She recalls that her paternal grandmother had  rheumatism.  She is gravida 0, para 0.  No history of DVTs.  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  She states it became difficult for her to type anymore due to arthritis in her fingers and loss of nails due to lichen planus.  She also has chronic discomfort in her neck upper back and  lower back which makes it difficult for her to sit for more than an hour.  She has difficulty standing and walking.  She states due to pain and discomfort in multiple joints she is unable to do any job.  She is applying for disability.  She tries to walk for exercise.  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  Patient states she was evaluated by Dr. Delfina Redwood and was diagnosed with lichen planus.  She has tried multiple medications and cortisone injections in her scalp without much results.       Activities of Daily Living:  Patient reports morning stiffness for 30 minutes.   Patient Reports nocturnal pain.  Difficulty dressing/grooming: Denies Difficulty climbing stairs: Denies Difficulty getting out of chair: Denies Difficulty using hands for taps, buttons, cutlery, and/or writing: Reports   Review of Systems  Constitutional:  Positive for fatigue.  HENT:  Positive for mouth dryness. Negative for mouth sores.   Eyes:  Negative for dryness.  Respiratory:  Negative for shortness of breath.   Cardiovascular:  Negative for chest pain and palpitations.  Gastrointestinal:  Negative for blood in stool, constipation and diarrhea.  Endocrine: Negative for increased urination.  Genitourinary:  Negative for involuntary urination.  Musculoskeletal:  Positive for joint pain, gait problem, joint pain, joint swelling, myalgias, muscle weakness, morning stiffness, muscle tenderness and myalgias.  Skin:  Positive for hair loss. Negative for color change, rash and sensitivity to sunlight.  Allergic/Immunologic: Negative for susceptible to infections.   Neurological:  Positive for dizziness, numbness and headaches.  Hematological:  Negative for swollen glands.  Psychiatric/Behavioral:  Positive for depressed mood and sleep disturbance. The patient is nervous/anxious.       PMFS History:      Patient Active Problem List    Diagnosis Date Noted   Hepatitis C antibody test positive 12/13/2020   Hypothyroidism (acquired) 12/13/2020        Past Medical History:  Diagnosis Date   Allergy     Anxiety     Arthritis     Chicken pox     Depression     Diverticulitis     Emphysema of lung (HCC)     GERD (gastroesophageal reflux disease)     Headache     History of colon polyps     Jaundice     Lichen planopilaris     Thyroid disease     Urinary incontinence               Family History  Problem Relation Age of Onset   Breast cancer Mother 45   Heart disease Mother     Hearing loss Mother     Depression Mother     Cancer Mother     Arthritis Mother     Cancer Father     Hyperlipidemia Father     Depression Sister     Depression Sister     Heart disease Sister     Heart disease Maternal Grandmother     Cancer Maternal Grandfather     Arthritis Paternal Grandmother     Rheum arthritis Paternal Grandmother     Cancer Paternal Grandfather     Heart attack Paternal Grandfather     Esophageal cancer Neg Hx     Colon cancer Neg Hx     Stomach cancer Neg Hx     Rectal cancer Neg Hx  Past Surgical History:  Procedure Laterality Date   ABDOMINAL HYSTERECTOMY   2012   APPENDECTOMY   2009   COLONOSCOPY   12/02/2016    Dr. Vista Mink. Moderate diverticulosis of the ascending colon, transverse colon, descending colon and sigmoid colon, increase in vascularity of the rectum. External hemorrhoids.   COLONOSCOPY   08/09/2013    Dr. Vista Mink. Moderate diverticulosis of the hepatic flexure, transverse colon, splenic flexure, descending colon and sigmoid colon. Polyp (7mm) in the proximal sigmoid colon.  (polypectomy). External hemorrhoids.   ESOPHAGOGASTRODUODENOSCOPY   08/09/2013    Dr. Vista Mink. Normal stomach. Normal duodenum. Grade 1 esophagitis was seen in the lower third of the esophagus.        Social History       Social History Narrative   Not on file        Immunization History  Administered Date(s) Administered   PFIZER(Purple Top)SARS-COV-2 Vaccination 05/13/2019, 06/09/2019   Tdap 04/18/2021   Zoster Recombinant(Shingrix) 12/13/2020, 04/18/2021      Objective: Vital Signs: BP 104/74 (BP Location: Right Arm, Patient Position: Sitting, Cuff Size: Normal)   Pulse 89   Resp 17   Ht 5\' 3"  (1.6 m)   Wt 192 lb 12.8 oz (87.5 kg)   BMI 34.15 kg/m     Physical Exam Vitals and nursing note reviewed.  Constitutional:      Appearance: She is well-developed.  HENT:     Head: Normocephalic and atraumatic.  Eyes:     Conjunctiva/sclera: Conjunctivae normal.  Cardiovascular:     Rate and Rhythm: Normal rate and regular rhythm.     Heart sounds: Normal heart sounds.  Pulmonary:     Effort: Pulmonary effort is normal.     Breath sounds: Normal breath sounds.  Abdominal:     General: Bowel sounds are normal.     Palpations: Abdomen is soft.  Musculoskeletal:     Cervical back: Normal range of motion.  Lymphadenopathy:     Cervical: No cervical adenopathy.  Skin:    General: Skin is warm and dry.     Capillary Refill: Capillary refill takes less than 2 seconds.     Comments: She has been patchy alopecia on her scalp.  She also had nail dystrophy and loss of nails.  Neurological:     Mental Status: She is alert and oriented to person, place, and time.  Psychiatric:        Behavior: Behavior normal.        Musculoskeletal Exam: She good range of motion of the cervical spine with some discomfort.  She had discomfort with range of motion of her thoracic and lumbar spine without any point tenderness.  There was no SI joint tenderness.  Shoulder joints, elbow joints,  wrist joints, MCPs PIPs and DIPs were in good range of motion.  She had bilateral PIP and DIP thickening with no synovitis.  She has lost several of her nails.  Hip joints and knee joints were in good range of motion.  She had discomfort range of motion of bilateral hip joints and knee joints without any warmth swelling or effusion.  There was no tenderness over ankles or MTPs.  No synovitis was noted.   CDAI Exam: CDAI Score: -- Patient Global: --; Provider Global: -- Swollen: --; Tender: -- Joint Exam 10/16/2022      No joint exam has been documented for this visit      There is currently no information documented on the homunculus.  Go to the Rheumatology activity and complete the homunculus joint exam.   Investigation: No additional findings.   Imaging: Imaging Results  No results found.     Recent Labs: Recent Labs       Lab Results  Component Value Date    WBC 3.5 (L) 04/22/2022    HGB 12.7 04/22/2022    PLT 90.0 (L) 04/22/2022    NA 140 04/22/2022    K 4.1 04/22/2022    CL 104 04/22/2022    CO2 27 04/22/2022    GLUCOSE 100 (H) 04/22/2022    BUN 14 04/22/2022    CREATININE 0.79 04/22/2022    BILITOT 1.3 (H) 04/22/2022    ALKPHOS 53 04/22/2022    AST 19 04/22/2022    ALT 12 04/22/2022    PROT 7.0 04/22/2022    ALBUMIN 4.3 04/22/2022    CALCIUM 9.7 04/22/2022      April 22, 2022 B12 normal, folate normal, hemoglobin A1c 5.3, TSH normal, ferritin 17.2, ANA negative, RF 18, anti-CCP negative, CRP<1.0, ESR 18, vitamin D23.65     IMPRESSION: 1. 3 mm anterolisthesis of L4 versus L5. 2. Lower lumbar facet degenerative change. 3. Minimal calcified atherosclerosis in the abdominal aorta.  Electronically Signed   By: Gerome Sam III M.D.   On: 04/20/2021 13:44   - 04/02/2017: CT abdomen/pelvis: Mildly enlarged liver measuring 18 cm. Nodular contour is again seen, compatible with cirrhosis. Normal attenuation. No liver lesion identified. Mild contraction of the GB  with mild wall thickening of 4 mm. No pericholecystic fluid, gallstones, sludge. No duct dilation. Enlarged spleen measuring 17.3 cm. A few tiny splenic cysts. Normal pancreas without PD dilation. Focal enteritis of the loop of jejunum within the central abdomen and the terminal ileum. Mild portal hypertension with splenomegaly, enlarged main portal vein, enlarged splenic vein    Speciality Comments: No specialty comments available.   Procedures:  No procedures performed Allergies: Molds & smuts, Sertraline, and Sulfa antibiotics    Assessment / Plan:     Visit Diagnoses: Pain in both hands -she complains of pain and discomfort in her bilateral hands for last several years which has been progressively getting worse.  She has noticed knots over her fingers.  On examination today hypertensive Bouchard's nodes were noted.  No synovitis was noted.  There was no involvement of MCP joints or wrist joints.  The clinical findings were consistent with osteoarthritis.  April 30, 2022 x-rays of bilateral hands reviewed by me personally showed bilateral CMC PIP and DIP narrowing.  I offered ultrasound of bilateral hands to look for synovitis as I did not see any synovitis on the clinical examination but patient declined.  A handout on muscle strengthening exercises was given.  Joint protection muscle strengthening was discussed.   Rheumatoid factor positive -patient had no synovitis on the examination.  Positive rheumatoid factor is most likely due to positive hepatitis C.   Chronic pain of both hips -she complains of discomfort in the bilateral hip joints.  She discomfort range of motion.  Plan: XR HIPS BILAT W OR W/O PELVIS 3-4 VIEWS.  X-rays of bilateral hip joints were unremarkable.   Chronic pain of both knees -she complains of pain and discomfort in her knee joints.  She has difficulty climbing stairs and walking.  No warmth swelling or effusion was noted.  Plan: XR KNEE 3 VIEW RIGHT, XR KNEE 3 VIEW LEFT.   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  A handout on  lower extremity exercises was given.   Paresthesia of both hands - B12 and folate normal.   Paresthesia of both feet-patient states that she has been diagnosed with peripheral neuropathy.   Neck pain-she complains of pain and stiffness in her cervical spine.  I do not have any x-rays to review.  Patient has been seeing a [facility].  A handout on neck exercises was given.   DDD (degenerative disc disease), lumbar -she gives history of lower back pain for more than 25 years.  She used to get injections by pain management 20 years ago.  She has tried NSAIDs in the past.  Now she has been seeing a chiropractor on a weekly basis.  04/21/22 x-rays of lumbar spine showed degenerative disc disease and facet joint arthropathy.  She has not been doing any exercises at home.  Core strengthening exercises were discussed.  A handout on back exercises was given.   Polyarthralgia-patient gives history of pain and discomfort in multiple joints.  Patient states that she is applying for disability due to unable to perform her job.  She states due to severe arthritis in her hands and loss of nail she is unable to type and grip objects.  She is unable to sit for prolonged time due to arthritis in her lower back and upper back.  She is unable to stand for long time and also has significant discomfort and walking.   Hepatitis C antibody test positive -patient was diagnosed with hepatitis C many years ago.  Patient states she was treated for hepatitis C.  She has been followed by gastroenterology.  November 01, 2016 hep C RNA quant positive   Other cirrhosis of liver (HCC) - Followed by Dr. Bethann Berkshire.  Last visit April 27, 2021.   Hypothyroidism (acquired)   Thrombocytopenia (HCC)-longstanding history of thrombocytopenia per patient.   Vitamin D deficiency   Lichen planus-she is followed by Dr. Delfina Redwood.  Patient has alopecia and nail  dystrophy and loss of nails in her hands and feet.  Patient has tried multiple medications without any results.  She also had intradermal injection on her scalp without success.   BMI 34.15-patient reports 10 pounds of intentional weight loss over the last 6 months.  She is trying dietary modifications.  She has been also trying to walk for weight loss.   Orders:    Orders Placed This Encounter  Procedures   XR HIPS BILAT W OR W/O PELVIS 3-4 VIEWS   XR KNEE 3 VIEW RIGHT   XR KNEE 3 VIEW LEFT    No orders of the defined types were placed in this encounter.   Face-to-face time spent patient was over 60 minutes.  More than 50% time was spent in counseling and coordination of care.  Follow-Up Instructions: Return for Positive RF, polyarthralgia.     Pollyann Savoy, MD   Note - This record has been created using Animal nutritionist.  Chart creation errors have been sought, but may not always  have been located. Such creation errors do not reflect on  the standard of medical care.Pain in both hands

## 2022-10-16 NOTE — Patient Instructions (Signed)
Hand Exercises Hand exercises can be helpful for almost anyone. They can strengthen your hands and improve flexibility and movement. The exercises can also increase blood flow to the hands. These results can make your work and daily tasks easier for you. Hand exercises can be especially helpful for people who have joint pain from arthritis or nerve damage from using their hands over and over. These exercises can also help people who injure a hand. Exercises Most of these hand exercises are gentle stretching and motion exercises. It is usually safe to do them often throughout the day. Warming up your hands before exercise may help reduce stiffness. You can do this with gentle massage or by placing your hands in warm water for 10-15 minutes. It is normal to feel some stretching, pulling, tightness, or mild discomfort when you begin new exercises. In time, this will improve. Remember to always be careful and stop right away if you feel sudden, very bad pain or your pain gets worse. You want to get better and be safe. Ask your health care provider which exercises are safe for you. Do exercises exactly as told by your provider and adjust them as told. Do not begin these exercises until told by your provider. Knuckle bend or "claw" fist  Stand or sit with your arm, hand, and all five fingers pointed straight up. Make sure to keep your wrist straight. Gently bend your fingers down toward your palm until the tips of your fingers are touching your palm. Keep your big knuckle straight and only bend the small knuckles in your fingers. Hold this position for 10 seconds. Straighten your fingers back to your starting position. Repeat this exercise 5-10 times with each hand. Full finger fist  Stand or sit with your arm, hand, and all five fingers pointed straight up. Make sure to keep your wrist straight. Gently bend your fingers into your palm until the tips of your fingers are touching the middle of your  palm. Hold this position for 10 seconds. Extend your fingers back to your starting position, stretching every joint fully. Repeat this exercise 5-10 times with each hand. Straight fist  Stand or sit with your arm, hand, and all five fingers pointed straight up. Make sure to keep your wrist straight. Gently bend your fingers at the big knuckle, where your fingers meet your hand, and at the middle knuckle. Keep the knuckle at the tips of your fingers straight and try to touch the bottom of your palm. Hold this position for 10 seconds. Extend your fingers back to your starting position, stretching every joint fully. Repeat this exercise 5-10 times with each hand. Tabletop  Stand or sit with your arm, hand, and all five fingers pointed straight up. Make sure to keep your wrist straight. Gently bend your fingers at the big knuckle, where your fingers meet your hand, as far down as you can. Keep the small knuckles in your fingers straight. Think of forming a tabletop with your fingers. Hold this position for 10 seconds. Extend your fingers back to your starting position, stretching every joint fully. Repeat this exercise 5-10 times with each hand. Finger spread  Place your hand flat on a table with your palm facing down. Make sure your wrist stays straight. Spread your fingers and thumb apart from each other as far as you can until you feel a gentle stretch. Hold this position for 10 seconds. Bring your fingers and thumb tight together again. Hold this position for 10 seconds. Repeat  this exercise 5-10 times with each hand. Making circles  Stand or sit with your arm, hand, and all five fingers pointed straight up. Make sure to keep your wrist straight. Make a circle by touching the tip of your thumb to the tip of your index finger. Hold for 10 seconds. Then open your hand wide. Repeat this motion with your thumb and each of your fingers. Repeat this exercise 5-10 times with each hand. Thumb  motion  Sit with your forearm resting on a table and your wrist straight. Your thumb should be facing up toward the ceiling. Keep your fingers relaxed as you move your thumb. Lift your thumb up as high as you can toward the ceiling. Hold for 10 seconds. Bend your thumb across your palm as far as you can, reaching the tip of your thumb for the small finger (pinkie) side of your palm. Hold for 10 seconds. Repeat this exercise 5-10 times with each hand. Grip strengthening  Hold a stress ball or other soft ball in the middle of your hand. Slowly increase the pressure, squeezing the ball as much as you can without causing pain. Think of bringing the tips of your fingers into the middle of your palm. All of your finger joints should bend when doing this exercise. Hold your squeeze for 10 seconds, then relax. Repeat this exercise 5-10 times with each hand. Contact a health care provider if: Your hand pain or discomfort gets much worse when you do an exercise. Your hand pain or discomfort does not improve within 2 hours after you exercise. If you have either of these problems, stop doing these exercises right away. Do not do them again unless your provider says that you can. Get help right away if: You develop sudden, severe hand pain or swelling. If this happens, stop doing these exercises right away. Do not do them again unless your provider says that you can. This information is not intended to replace advice given to you by your health care provider. Make sure you discuss any questions you have with your health care provider. Document Revised: 02/05/2022 Document Reviewed: 02/05/2022 [employer] Patient Education  2024 [employer] Exercises for Chronic Knee Pain Chronic knee pain is pain that lasts longer than 3 months. For most people with chronic knee pain, exercise and weight loss is an important part of treatment. Your health care provider may want you to focus on: Making the muscles that  support your knee stronger. This can take pressure off your knee and reduce pain. Preventing knee stiffness. How far you can move your knee, keeping it there or making it farther. Losing weight (if this applies) to take pressure off your knee, lower your risk for injury, and make it easier for you to exercise. Your provider will help you make an exercise program that fits your needs and physical abilities. Below are simple, low-impact exercises you can do at home. Ask your provider or physical therapist how often you should do your exercise program and how many times to repeat each exercise. General safety tips  Get your provider's approval before doing any exercises. Start slowly and stop any time you feel pain. Do not exercise if your knee pain is flaring up. Warm up first. Stretching a cold muscle can cause an injury. Do 5-10 minutes of easy movement or light stretching before beginning your exercises. Do 5-10 minutes of low-impact activity (like walking or cycling) before starting strengthening exercises. Contact your provider any time you have pain during  or after exercising. Exercise can cause discomfort but should not be painful. It is normal to be a little stiff or sore after exercising. Stretching and range-of-motion exercises Front thigh stretch  Stand up straight and support your body by holding on to a chair or resting one hand on a wall. With your legs straight and close together, bend one knee to lift your heel up toward your butt. Using one hand for support, grab your ankle with your free hand. Pull your foot up closer toward your butt to feel the stretch in front of your thigh. Hold the stretch for 30 seconds. Repeat __________ times. Complete this exercise __________ times a day. Back thigh stretch  Sit on the floor with your back straight and your legs out straight in front of you. Place the palms of your hands on the floor and slide them toward your feet as you bend at the  hip. Try to touch your nose to your knees and feel the stretch in the back of your thighs. Hold for 30 seconds. Repeat __________ times. Complete this exercise __________ times a day. Calf stretch  Stand facing a wall. Place the palms of your hands flat against the wall, arms extended, and lean slightly against the wall. Get into a lunge position with one leg bent at the knee and the other leg stretched out straight behind you. Keep both feet facing the wall and increase the bend in your knee while keeping the heel of the other leg flat on the ground. You should feel the stretch in your calf. Hold for 30 seconds. Repeat __________ times. Complete this exercise __________ times a day. Strengthening exercises Straight leg lift  Lie on your back with one knee bent and the other leg out straight. Slowly lift the straight leg without bending the knee. Lift until your foot is about 12 inches (30 cm) off the floor. Hold for 3-5 seconds and slowly lower your leg. Repeat __________ times. Complete this exercise __________ times a day. Single leg dip  Stand between two chairs and put both hands on the backs of the chairs for support. Extend one leg out straight with your body weight resting on the heel of the standing leg. Slowly bend your standing knee to dip your body to the level that is comfortable for you. Hold for 3-5 seconds. Repeat __________ times. Complete this exercise __________ times a day. Hamstring curls  Stand straight, knees close together, facing the back of a chair. Hold on to the back of a chair with both hands. Keep one leg straight. Bend the other knee while bringing the heel up toward the butt until the knee is bent at a 90-degree angle (right angle). Hold for 3-5 seconds. Repeat __________ times. Complete this exercise __________ times a day. Wall squat  Stand straight with your back, hips, and head against a wall. Step forward one foot at a time with your back  still against the wall. Your feet should be 2 feet (61 cm) from the wall at shoulder width. Keeping your back, hips, and head against the wall, slide down the wall to as close to a sitting position as you can get. Hold for 5-10 seconds, then slowly slide back up. Repeat __________ times. Complete this exercise __________ times a day. Step-ups  Stand in front of a sturdy platform or stool that is about 6 inches (15 cm) high. Slowly step up with your left / right foot, keeping your knee in line with your hip  and foot. Do not let your knee bend so far that you cannot see your toes. Hold on to a chair for balance, but do not use it for support. Slowly unlock your knee and lower yourself to the starting position. Repeat __________ times. Complete this exercise __________ times a day. Contact a health care provider if: Your exercises cause pain. Your pain is worse after you exercise. Your pain prevents you from doing your exercises. This information is not intended to replace advice given to you by your health care provider. Make sure you discuss any questions you have with your health care provider. Document Revised: 02/05/2022 Document Reviewed: 02/05/2022 [employer] Patient Education  2024 [employer] Low Back Sprain or Strain Rehab Ask your health care provider which exercises are safe for you. Do exercises exactly as told by your health care provider and adjust them as directed. It is normal to feel mild stretching, pulling, tightness, or discomfort as you do these exercises. Stop right away if you feel sudden pain or your pain gets worse. Do not begin these exercises until told by your health care provider. Stretching and range-of-motion exercises These exercises warm up your muscles and joints and improve the movement and flexibility of your back. These exercises also help to relieve pain, numbness, and tingling. Lumbar rotation  Lie on your back on a firm bed or the floor with your knees  bent. Straighten your arms out to your sides so each arm forms a 90-degree angle (right angle) with a side of your body. Slowly move (rotate) both of your knees to one side of your body until you feel a stretch in your lower back (lumbar). Try not to let your shoulders lift off the floor. Hold this position for __________ seconds. Tense your abdominal muscles and slowly move your knees back to the starting position. Repeat this exercise on the other side of your body. Repeat __________ times. Complete this exercise __________ times a day. Single knee to chest  Lie on your back on a firm bed or the floor with both legs straight. Bend one of your knees. Use your hands to move your knee up toward your chest until you feel a gentle stretch in your lower back and buttock. Hold your leg in this position by holding on to the front of your knee. Keep your other leg as straight as possible. Hold this position for __________ seconds. Slowly return to the starting position. Repeat with your other leg. Repeat __________ times. Complete this exercise __________ times a day. Prone extension on elbows  Lie on your abdomen on a firm bed or the floor (prone position). Prop yourself up on your elbows. Use your arms to help lift your chest up until you feel a gentle stretch in your abdomen and your lower back. This will place some of your body weight on your elbows. If this is uncomfortable, try stacking pillows under your chest. Your hips should stay down, against the surface that you are lying on. Keep your hip and back muscles relaxed. Hold this position for __________ seconds. Slowly relax your upper body and return to the starting position. Repeat __________ times. Complete this exercise __________ times a day. Strengthening exercises These exercises build strength and endurance in your back. Endurance is the ability to use your muscles for a long time, even after they get tired. Pelvic tilt This  exercise strengthens the muscles that lie deep in the abdomen. Lie on your back on a firm bed or the  floor with your legs extended. Bend your knees so they are pointing toward the ceiling and your feet are flat on the floor. Tighten your lower abdominal muscles to press your lower back against the floor. This motion will tilt your pelvis so your tailbone points up toward the ceiling instead of pointing to your feet or the floor. To help with this exercise, you may place a small towel under your lower back and try to push your back into the towel. Hold this position for __________ seconds. Let your muscles relax completely before you repeat this exercise. Repeat __________ times. Complete this exercise __________ times a day. Alternating arm and leg raises  Get on your hands and knees on a firm surface. If you are on a hard floor, you may want to use padding, such as an exercise mat, to cushion your knees. Line up your arms and legs. Your hands should be directly below your shoulders, and your knees should be directly below your hips. Lift your left leg behind you. At the same time, raise your right arm and straighten it in front of you. Do not lift your leg higher than your hip. Do not lift your arm higher than your shoulder. Keep your abdominal and back muscles tight. Keep your hips facing the ground. Do not arch your back. Keep your balance carefully, and do not hold your breath. Hold this position for __________ seconds. Slowly return to the starting position. Repeat with your right leg and your left arm. Repeat __________ times. Complete this exercise __________ times a day. Abdominal set with straight leg raise  Lie on your back on a firm bed or the floor. Bend one of your knees and keep your other leg straight. Tense your abdominal muscles and lift your straight leg up, 4-6 inches (10-15 cm) off the ground. Keep your abdominal muscles tight and hold this position for __________  seconds. Do not hold your breath. Do not arch your back. Keep it flat against the ground. Keep your abdominal muscles tense as you slowly lower your leg back to the starting position. Repeat with your other leg. Repeat __________ times. Complete this exercise __________ times a day. Single leg lower with bent knees Lie on your back on a firm bed or the floor. Tense your abdominal muscles and lift your feet off the floor, one foot at a time, so your knees and hips are bent in 90-degree angles (right angles). Your knees should be over your hips and your lower legs should be parallel to the floor. Keeping your abdominal muscles tense and your knee bent, slowly lower one of your legs so your toe touches the ground. Lift your leg back up to return to the starting position. Do not hold your breath. Do not let your back arch. Keep your back flat against the ground. Repeat with your other leg. Repeat __________ times. Complete this exercise __________ times a day. Posture and body mechanics Good posture and healthy body mechanics can help to relieve stress in your body's tissues and joints. Body mechanics refers to the movements and positions of your body while you do your daily activities. Posture is part of body mechanics. Good posture means: Your spine is in its natural S-curve position (neutral). Your shoulders are pulled back slightly. Your head is not tipped forward (neutral). Follow these guidelines to improve your posture and body mechanics in your everyday activities. Standing  When standing, keep your spine neutral and your feet about hip-width apart. Keep a  slight bend in your knees. Your ears, shoulders, and hips should line up. When you do a task in which you stand in one place for a long time, place one foot up on a stable object that is 2-4 inches (5-10 cm) high, such as a footstool. This helps keep your spine neutral. Sitting  When sitting, keep your spine neutral and keep your  feet flat on the floor. Use a footrest, if necessary, and keep your thighs parallel to the floor. Avoid rounding your shoulders, and avoid tilting your head forward. When working at a desk or a computer, keep your desk at a height where your hands are slightly lower than your elbows. Slide your chair under your desk so you are close enough to maintain good posture. When working at a computer, place your monitor at a height where you are looking straight ahead and you do not have to tilt your head forward or downward to look at the screen. Resting When lying down and resting, avoid positions that are most painful for you. If you have pain with activities such as sitting, bending, stooping, or squatting, lie in a position in which your body does not bend very much. For example, avoid curling up on your side with your arms and knees near your chest (fetal position). If you have pain with activities such as standing for a long time or reaching with your arms, lie with your spine in a neutral position and bend your knees slightly. Try the following positions: Lying on your side with a pillow between your knees. Lying on your back with a pillow under your knees. Lifting  When lifting objects, keep your feet at least shoulder-width apart and tighten your abdominal muscles. Bend your knees and hips and keep your spine neutral. It is important to lift using the strength of your legs, not your back. Do not lock your knees straight out. Always ask for help to lift heavy or awkward objects. This information is not intended to replace advice given to you by your health care provider. Make sure you discuss any questions you have with your health care provider. Document Revised: 05/27/2022 Document Reviewed: 04/10/2020 [employer] Patient Education  2024 [employer] Cervical Strain and Sprain Rehab Ask your health care provider which exercises are safe for you. Do exercises exactly as told by your health care  provider and adjust them as directed. It is normal to feel mild stretching, pulling, tightness, or discomfort as you do these exercises. Stop right away if you feel sudden pain or your pain gets worse. Do not begin these exercises until told by your health care provider. Stretching and range-of-motion exercises Cervical side bending  Using good posture, sit on a stable chair or stand up. Without moving your shoulders, slowly tilt your left / right ear to your shoulder until you feel a stretch in the neck muscles on the opposite side. You should be looking straight ahead. Hold for __________ seconds. Repeat with the other side of your neck. Repeat __________ times. Complete this exercise __________ times a day. Cervical rotation  Using good posture, sit on a stable chair or stand up. Slowly turn your head to the side as if you are looking over your left / right shoulder. Keep your eyes level with the ground. Stop when you feel a stretch along the side and the back of your neck. Hold for __________ seconds. Repeat this by turning to your other side. Repeat __________ times. Complete this exercise __________ times  a day. Thoracic extension and pectoral stretch  Roll a towel or a small blanket so it is about 4 inches (10 cm) in diameter. Lie down on your back on a firm surface. Put the towel in the middle of your back across your spine. It should not be under your shoulder blades. Put your hands behind your head and let your elbows fall out to your sides. Hold for __________ seconds. Repeat __________ times. Complete this exercise __________ times a day. Strengthening exercises Upper cervical flexion  Lie on your back with a thin pillow behind your head or a small, rolled-up towel under your neck. Gently tuck your chin toward your chest and nod your head down to look toward your feet. Do not lift your head off the pillow. Hold for __________ seconds. Release the tension slowly. Relax your  neck muscles completely before you repeat this exercise. Repeat __________ times. Complete this exercise __________ times a day. Cervical extension  Stand about 6 inches (15 cm) away from a wall, with your back facing the wall. Place a soft object, about 6-8 inches (15-20 cm) in diameter, between the back of your head and the wall. A soft object could be a small pillow, a ball, or a folded towel. Gently tilt your head back and press into the soft object. Keep your jaw and forehead relaxed. Hold for __________ seconds. Release the tension slowly. Relax your neck muscles completely before you repeat this exercise. Repeat __________ times. Complete this exercise __________ times a day. Posture and body mechanics Body mechanics refer to the movements and positions of your body while you do your daily activities. Posture is part of body mechanics. Good posture and healthy body mechanics can help to relieve stress in your body's tissues and joints. Good posture means that your spine is in its natural S-curve position (your spine is neutral), your shoulders are pulled back slightly, and your head is not tipped forward. The following are general guidelines for using improved posture and body mechanics in your everyday activities. Sitting  When sitting, keep your spine neutral and keep your feet flat on the floor. Use a footrest, if needed, and keep your thighs parallel to the floor. Avoid rounding your shoulders. Avoid tilting your head forward. When working at a desk or a computer, keep your desk at a height where your hands are slightly lower than your elbows. Slide your chair under your desk so you are close enough to maintain good posture. When working at a computer, place your monitor at a height where you are looking straight ahead and you do not have to tilt your head forward or downward to look at the screen. Standing  When standing, keep your spine neutral and keep your feet about hip-width  apart. Keep a slight bend in your knees. Your ears, shoulders, and hips should line up. When you do a task in which you stand in one place for a long time, place one foot up on a stable object that is 2-4 inches (5-10 cm) high, such as a footstool. This helps keep your spine neutral. Resting When lying down and resting, avoid positions that are most painful for you. Try to support your neck in a neutral position. You can use a contour pillow or a small rolled-up towel. Your pillow should support your neck but not push on it. This information is not intended to replace advice given to you by your health care provider. Make sure you discuss any questions you have  with your health care provider. Document Revised: 05/27/2022 Document Reviewed: 08/13/2021 [employer] Patient Education  2024 [employer].

## 2022-10-30 ENCOUNTER — Ambulatory Visit: Payer: 59 | Admitting: Family Medicine

## 2022-10-30 ENCOUNTER — Ambulatory Visit: Payer: 59 | Attending: Family Medicine

## 2022-10-30 ENCOUNTER — Encounter: Payer: Self-pay | Admitting: Family Medicine

## 2022-10-30 VITALS — BP 128/84 | HR 88 | Resp 18 | Ht 63.0 in | Wt 194.6 lb

## 2022-10-30 DIAGNOSIS — K859 Acute pancreatitis without necrosis or infection, unspecified: Secondary | ICD-10-CM

## 2022-10-30 DIAGNOSIS — E039 Hypothyroidism, unspecified: Secondary | ICD-10-CM | POA: Diagnosis not present

## 2022-10-30 DIAGNOSIS — R748 Abnormal levels of other serum enzymes: Secondary | ICD-10-CM

## 2022-10-30 DIAGNOSIS — Z23 Encounter for immunization: Secondary | ICD-10-CM | POA: Diagnosis not present

## 2022-10-30 DIAGNOSIS — R002 Palpitations: Secondary | ICD-10-CM

## 2022-10-30 DIAGNOSIS — R1012 Left upper quadrant pain: Secondary | ICD-10-CM | POA: Diagnosis not present

## 2022-10-30 DIAGNOSIS — M154 Erosive (osteo)arthritis: Secondary | ICD-10-CM | POA: Diagnosis not present

## 2022-10-30 LAB — CBC
HCT: 39.8 % (ref 36.0–46.0)
Hemoglobin: 12.8 g/dL (ref 12.0–15.0)
MCHC: 32.2 g/dL (ref 30.0–36.0)
MCV: 88.2 fl (ref 78.0–100.0)
Platelets: 90 10*3/uL — ABNORMAL LOW (ref 150.0–400.0)
RBC: 4.51 Mil/uL (ref 3.87–5.11)
RDW: 14.1 % (ref 11.5–15.5)
WBC: 3.6 10*3/uL — ABNORMAL LOW (ref 4.0–10.5)

## 2022-10-30 LAB — COMPREHENSIVE METABOLIC PANEL
ALT: 12 U/L (ref 0–35)
AST: 21 U/L (ref 0–37)
Albumin: 4.3 g/dL (ref 3.5–5.2)
Alkaline Phosphatase: 54 U/L (ref 39–117)
BUN: 13 mg/dL (ref 6–23)
CO2: 30 mEq/L (ref 19–32)
Calcium: 10 mg/dL (ref 8.4–10.5)
Chloride: 103 mEq/L (ref 96–112)
Creatinine, Ser: 0.78 mg/dL (ref 0.40–1.20)
GFR: 81.2 mL/min (ref >60.00)
Glucose, Bld: 93 mg/dL (ref 70–99)
Potassium: 3.8 mEq/L (ref 3.5–5.1)
Sodium: 140 mEq/L (ref 135–145)
Total Bilirubin: 0.9 mg/dL (ref 0.2–1.2)
Total Protein: 7.2 g/dL (ref 6.0–8.3)

## 2022-10-30 LAB — TSH: TSH: 0.93 u[IU]/mL (ref 0.35–5.50)

## 2022-10-30 LAB — LIPASE: Lipase: 256 U/L — ABNORMAL HIGH (ref 11.0–59.0)

## 2022-10-30 LAB — TROPONIN I (HIGH SENSITIVITY): High Sens Troponin I: 5 ng/L (ref 2–17)

## 2022-10-30 NOTE — Patient Instructions (Signed)
I will be in touch with your labs asap We will get you a heart monitor to wear at home- I assigned for 7 days but ok if you have to cut it short for your trip! Please let me know if anything is changing or getting worse, and I will be in touch with your results

## 2022-10-30 NOTE — Progress Notes (Unsigned)
Enrolled patient for a 7 day Zio XT monitor to be mailed to patients home.  

## 2022-11-01 ENCOUNTER — Telehealth: Payer: Self-pay | Admitting: Radiology

## 2022-11-01 NOTE — Telephone Encounter (Signed)
Dr. Dallas Schimke called to follow-up on patient's Zio monitor ordered on 9/25.  Dr. Dallas Schimke stated can reach her on her cell phone at [phone] or call patient directly.

## 2022-11-03 NOTE — Addendum Note (Signed)
Addended by: Pearline Cables on: 11/03/2022 06:57 AM   Modules accepted: Orders
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
## 2022-11-04 NOTE — Telephone Encounter (Signed)
Called to do a peer to peer today at 12:25pm- connected with MD Advised pt had abd tenderness  [id number] Expires 05/03/23

## 2022-11-04 NOTE — Telephone Encounter (Signed)
Advised pt and referrals staff that CT is approved

## 2022-11-05 ENCOUNTER — Ambulatory Visit (HOSPITAL_BASED_OUTPATIENT_CLINIC_OR_DEPARTMENT_OTHER)
Admission: RE | Admit: 2022-11-05 | Discharge: 2022-11-05 | Disposition: A | Discharge status: Home / Self Care | Payer: 59 | Source: Ambulatory Visit | Attending: Family Medicine | Admitting: Family Medicine

## 2022-11-05 ENCOUNTER — Encounter (HOSPITAL_BASED_OUTPATIENT_CLINIC_OR_DEPARTMENT_OTHER): Payer: Self-pay

## 2022-11-05 ENCOUNTER — Other Ambulatory Visit (INDEPENDENT_AMBULATORY_CARE_PROVIDER_SITE_OTHER): Payer: 59

## 2022-11-05 ENCOUNTER — Encounter: Payer: Self-pay | Admitting: Family Medicine

## 2022-11-05 DIAGNOSIS — K859 Acute pancreatitis without necrosis or infection, unspecified: Secondary | ICD-10-CM | POA: Diagnosis not present

## 2022-11-05 DIAGNOSIS — R748 Abnormal levels of other serum enzymes: Secondary | ICD-10-CM

## 2022-11-05 DIAGNOSIS — K7460 Unspecified cirrhosis of liver: Secondary | ICD-10-CM | POA: Diagnosis not present

## 2022-11-05 DIAGNOSIS — R002 Palpitations: Secondary | ICD-10-CM

## 2022-11-05 DIAGNOSIS — R161 Splenomegaly, not elsewhere classified: Secondary | ICD-10-CM | POA: Diagnosis not present

## 2022-11-05 DIAGNOSIS — K5730 Diverticulosis of large intestine without perforation or abscess without bleeding: Secondary | ICD-10-CM | POA: Diagnosis not present

## 2022-11-05 DIAGNOSIS — K766 Portal hypertension: Secondary | ICD-10-CM | POA: Diagnosis not present

## 2022-11-05 LAB — LIPASE: Lipase: 130 U/L — ABNORMAL HIGH (ref 11.0–59.0)

## 2022-11-05 MED ORDER — IOHEXOL 300 MG/ML  SOLN
100.0000 mL | Freq: Once | INTRAMUSCULAR | Status: AC | PRN
  Administered 2022-11-05: 100 mL via INTRAVENOUS

## 2022-11-06 ENCOUNTER — Ambulatory Visit: Payer: 59 | Admitting: Rheumatology

## 2022-11-15 DIAGNOSIS — R002 Palpitations: Secondary | ICD-10-CM | POA: Diagnosis not present

## 2022-11-19 ENCOUNTER — Other Ambulatory Visit: Payer: Self-pay | Admitting: Family Medicine

## 2022-11-19 ENCOUNTER — Other Ambulatory Visit (HOSPITAL_BASED_OUTPATIENT_CLINIC_OR_DEPARTMENT_OTHER): Payer: Self-pay

## 2022-11-19 DIAGNOSIS — E039 Hypothyroidism, unspecified: Secondary | ICD-10-CM

## 2022-11-19 MED ORDER — SYNTHROID 75 MCG PO TABS
75.0000 ug | ORAL_TABLET | Freq: Every day | ORAL | 1 refills | Status: DC
  Filled 2022-11-19: qty 90, 90d supply, fill #0
  Filled 2023-02-20: qty 90, 90d supply, fill #1

## 2022-11-21 NOTE — Progress Notes (Signed)
Office Visit Note  Patient: Amanda Maynard             Date of Birth: 02/16/1960           MRN: [id number]             PCP: Pearline Cables, MD Referring: Pearline Cables, MD Visit Date: 12/05/2022 Occupation: @GUAROCC @  Subjective:  Pain in multiple joints  History of Present Illness: Amanda Maynard is a 62 y.o. female osteoarthritis and degenerative disc disease.  She states she continues to have pain and discomfort in her bilateral hands, bilateral knee joints, neck and lower back.  She states she has some insomnia due to discomfort.  She has not noticed any joint swelling.  She states she had some episodes of palpitations and had EKG which showed some abnormalities.  She states she has an appointment coming up with a cardiologist.  She was also diagnosed with cirrhosis of liver and has an appointment coming up with the gastroenterologist.    Activities of Daily Living:  Patient reports morning stiffness for all day. Patient Reports nocturnal pain.  Difficulty dressing/grooming: Denies Difficulty climbing stairs: Denies Difficulty getting out of chair: Denies Difficulty using hands for taps, buttons, cutlery, and/or writing: Reports  Review of Systems  Constitutional:  Positive for fatigue.  HENT:  Positive for mouth dryness. Negative for mouth sores.   Eyes:  Positive for dryness.  Respiratory:  Negative for shortness of breath.   Cardiovascular:  Negative for chest pain and palpitations.  Gastrointestinal:  Positive for diarrhea. Negative for blood in stool and constipation.  Endocrine: Negative for increased urination.  Genitourinary:  Negative for involuntary urination.  Musculoskeletal:  Positive for joint pain, joint pain, joint swelling, myalgias, morning stiffness and myalgias. Negative for gait problem, muscle weakness and muscle tenderness.  Skin:  Positive for hair loss. Negative for color change, rash and sensitivity to sunlight.  Allergic/Immunologic:  Negative for susceptible to infections.  Neurological:  Positive for dizziness. Negative for headaches.  Hematological:  Negative for swollen glands.  Psychiatric/Behavioral:  Positive for depressed mood and sleep disturbance. The patient is nervous/anxious.     PMFS History:  Patient Active Problem List   Diagnosis Date Noted   Chondromalacia of both patellae 12/05/2022   Primary osteoarthritis of both hands 12/05/2022   Degeneration of intervertebral disc of lumbar region without discogenic back pain or lower extremity pain 12/05/2022   Hepatitis C antibody test positive 12/13/2020   Hypothyroidism (acquired) 12/13/2020    Past Medical History:  Diagnosis Date   Allergy    Anxiety    Arthritis    Chicken pox    Depression    Diverticulitis    Emphysema of lung (HCC)    GERD (gastroesophageal reflux disease)    Headache    History of colon polyps    Jaundice    Lichen planopilaris    Thyroid disease    Urinary incontinence     Family History  Problem Relation Age of Onset   Breast cancer Mother 44   Heart disease Mother    Hearing loss Mother    Depression Mother    Cancer Mother    Arthritis Mother    Cancer Father    Hyperlipidemia Father    Depression Sister    Depression Sister    Heart disease Sister    Heart disease Maternal Grandmother    Cancer Maternal Grandfather    Arthritis Paternal Grandmother    Rheum  arthritis Paternal Grandmother    Cancer Paternal Grandfather    Heart attack Paternal Grandfather    Esophageal cancer Neg Hx    Colon cancer Neg Hx    Stomach cancer Neg Hx    Rectal cancer Neg Hx    Past Surgical History:  Procedure Laterality Date   ABDOMINAL HYSTERECTOMY  2012   APPENDECTOMY  2009   COLONOSCOPY  12/02/2016   Dr. Vista Mink. Moderate diverticulosis of the ascending colon, transverse colon, descending colon and sigmoid colon, increase in vascularity of the rectum. External hemorrhoids.   COLONOSCOPY  08/09/2013   Dr. Vista Mink. Moderate diverticulosis of the hepatic flexure, transverse colon, splenic flexure, descending colon and sigmoid colon. Polyp (7mm) in the proximal sigmoid colon. (polypectomy). External hemorrhoids.   ESOPHAGOGASTRODUODENOSCOPY  08/09/2013   Dr. Vista Mink. Normal stomach. Normal duodenum. Grade 1 esophagitis was seen in the lower third of the esophagus.   Social History   Social History Narrative   Not on file   Immunization History  Administered Date(s) Administered   Influenza, Seasonal, Injecte, Preservative Fre 10/30/2022   PFIZER(Purple Top)SARS-COV-2 Vaccination 05/13/2019, 06/09/2019   Tdap 04/18/2021   Zoster Recombinant(Shingrix) 12/13/2020, 04/18/2021     Objective: Vital Signs: BP 133/77 (BP Location: Left Arm, Patient Position: Sitting, Cuff Size: Normal)   Pulse (!) 56   Resp 17   Ht 5\' 3"  (1.6 m)   Wt 192 lb 6.4 oz (87.3 kg)   BMI 34.08 kg/m    Physical Exam Vitals and nursing note reviewed.  Constitutional:      Appearance: She is well-developed.  HENT:     Head: Normocephalic and atraumatic.  Eyes:     Conjunctiva/sclera: Conjunctivae normal.  Cardiovascular:     Rate and Rhythm: Normal rate and regular rhythm.     Heart sounds: Normal heart sounds.  Pulmonary:     Effort: Pulmonary effort is normal.     Breath sounds: Normal breath sounds.  Abdominal:     General: Bowel sounds are normal.     Palpations: Abdomen is soft.  Musculoskeletal:     Cervical back: Normal range of motion.  Lymphadenopathy:     Cervical: No cervical adenopathy.  Skin:    General: Skin is warm and dry.     Capillary Refill: Capillary refill takes less than 2 seconds.  Neurological:     Mental Status: She is alert and oriented to person, place, and time.  Psychiatric:        Behavior: Behavior normal.      Musculoskeletal Exam: She had good range of motion of the cervical spine.  She does stiffness with range of motion of the cervical thoracic and lumbar spine.   There was no tenderness over thoracic or lumbar spine.  There was no tenderness over SI joints.  Shoulders, elbows, wrist joints were in good range of motion without any warmth swelling or effusion.  She had bilateral CMC PIP and DIP thickening with subluxation of some of the DIP joints.  No synovitis was noted.  Hip joints and knee joints were in good range of motion without any warmth swelling or effusion.  There was no tenderness over ankles or MTPs.  CDAI Exam: CDAI Score: -- Patient Global: --; Provider Global: -- Swollen: --; Tender: -- Joint Exam 12/05/2022   No joint exam has been documented for this visit   There is currently no information documented on the homunculus. Go to the Rheumatology activity and complete the homunculus joint  exam.  Investigation: No additional findings.  Imaging: LONG TERM MONITOR (3-14 DAYS)  Result Date: 11/28/2022 Monitoring period: 5 days Sinus rhythm: HR 48-125 bpm, avg 71 bpm Two brief episodes of SVT occurred. The longer episode was 13 seconds in duration with a rate of 118 bpm and appearance most consistent with a brief atrial run. Ectopy was rare (<1%) overall. No atrial fibrillation was detected. No symptoms were reported    Recent Labs: Lab Results  Component Value Date   WBC 3.6 (L) 10/30/2022   HGB 12.8 10/30/2022   PLT 90.0 (L) 10/30/2022   NA 140 10/30/2022   K 3.8 10/30/2022   CL 103 10/30/2022   CO2 30 10/30/2022   GLUCOSE 93 10/30/2022   BUN 13 10/30/2022   CREATININE 0.78 10/30/2022   BILITOT 0.9 10/30/2022   ALKPHOS 54 10/30/2022   AST 21 10/30/2022   ALT 12 10/30/2022   PROT 7.2 10/30/2022   ALBUMIN 4.3 10/30/2022   CALCIUM 10.0 10/30/2022    April 22, 2022 B12 normal, folate normal, hemoglobin A1c 5.3, TSH normal, ferritin 17.2, ANA negative, RF 18, anti-CCP negative, CRP<1.0, ESR 18, vitamin D23.65   Speciality Comments: No specialty comments available.  Procedures:  No procedures performed Allergies: Molds &  smuts, Sertraline, and Sulfa antibiotics   Assessment / Plan:     Visit Diagnoses: Pain in both hands - History of pain in bilateral hands for many years.  Clinical and radiographic findings are consistent with osteoarthritis.  X-ray findings were reviewed with the patient.  Detailed counseling osteoarthritis was provided.  Joint protection muscle strengthening was discussed.  A handout on hand exercises was given.  I also discussed possible PT/OT in the future.  Rheumatoid factor positive - No synovitis was noted.    Chronic pain of both hips -she complains of off-and-on discomfort in her hips.  Her hip joints were going good range of motion without discomfort.  X-rays obtained at the last visit were unremarkable.  X-ray findings were reviewed with the patient.  Chronic pain of both knees - History of difficulty climbing stairs and walking.  X-rays obtained at the last visit showed bilateral moderate chondromalacia patella.  X-ray findings were reviewed with the patient.  A handout on knee exercises was given.  Paresthesia of both hands  Paresthesia of both feet - Diagnosed with peripheral neuropathy per patient.  Hepatitis C antibody test positive - Treated per patient.  Hepatitis C RNA quant + November 01, 2016.  Neck pain - Followed by chiropractor per patient.  Degeneration of intervertebral disc of lumbar region without discogenic back pain or lower extremity pain - X-rays from March 2024 showed degenerative changes and facet joint arthropathy.  A handout on back exercises was given.  Polyarthralgia - Patient is applying for disability due to pain and discomfort in multiple joints.  She has difficulty gripping objects due to loss of her nails and arthritis.  Other cirrhosis of liver (HCC) - Followed by Dr. Nolon Stalls.  Her last visit was on April 27, 2021.  Thrombocytopenia (HCC)  Vitamin D deficiency-her vitamin D was low.  Patient states she has been taking vitamin D on a daily  basis.  Advised her to double the dose of her vitamin D.  Hypothyroidism (acquired)  Lichen planus - Followed by Dr. Delfina Redwood.  Alopecia improved after cortisone injections.  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  BMI 34.0-34.9,adult  Orders: No orders of the defined types were  placed in this encounter.  No orders of the defined types were placed in this encounter.    Follow-Up Instructions: Return in about 1 year (around 12/05/2023) for Osteoarthritis.   Pollyann Savoy, MD  Note - This record has been created using Animal nutritionist.  Chart creation errors have been sought, but may not always  have been located. Such creation errors do not reflect on  the standard of medical care.

## 2022-11-29 ENCOUNTER — Encounter: Payer: Self-pay | Admitting: Family Medicine

## 2022-11-29 DIAGNOSIS — R002 Palpitations: Secondary | ICD-10-CM

## 2022-12-04 ENCOUNTER — Other Ambulatory Visit: Payer: 59

## 2022-12-04 NOTE — Addendum Note (Signed)
Addended by: Abbe Amsterdam C on: 12/04/2022 03:27 PM   Modules accepted: Orders

## 2022-12-05 ENCOUNTER — Encounter: Payer: Self-pay | Admitting: Rheumatology

## 2022-12-05 ENCOUNTER — Ambulatory Visit: Payer: 59 | Attending: Rheumatology | Admitting: Rheumatology

## 2022-12-05 VITALS — BP 133/77 | HR 56 | Resp 17 | Ht 63.0 in | Wt 192.4 lb

## 2022-12-05 DIAGNOSIS — E039 Hypothyroidism, unspecified: Secondary | ICD-10-CM

## 2022-12-05 DIAGNOSIS — M2241 Chondromalacia patellae, right knee: Secondary | ICD-10-CM

## 2022-12-05 DIAGNOSIS — M2550 Pain in unspecified joint: Secondary | ICD-10-CM | POA: Diagnosis not present

## 2022-12-05 DIAGNOSIS — M25552 Pain in left hip: Secondary | ICD-10-CM

## 2022-12-05 DIAGNOSIS — M79641 Pain in right hand: Secondary | ICD-10-CM

## 2022-12-05 DIAGNOSIS — M542 Cervicalgia: Secondary | ICD-10-CM | POA: Diagnosis not present

## 2022-12-05 DIAGNOSIS — M2242 Chondromalacia patellae, left knee: Secondary | ICD-10-CM

## 2022-12-05 DIAGNOSIS — K7469 Other cirrhosis of liver: Secondary | ICD-10-CM | POA: Diagnosis not present

## 2022-12-05 DIAGNOSIS — Z6834 Body mass index (BMI) 34.0-34.9, adult: Secondary | ICD-10-CM

## 2022-12-05 DIAGNOSIS — E559 Vitamin D deficiency, unspecified: Secondary | ICD-10-CM

## 2022-12-05 DIAGNOSIS — R202 Paresthesia of skin: Secondary | ICD-10-CM | POA: Diagnosis not present

## 2022-12-05 DIAGNOSIS — M25551 Pain in right hip: Secondary | ICD-10-CM | POA: Diagnosis not present

## 2022-12-05 DIAGNOSIS — L439 Lichen planus, unspecified: Secondary | ICD-10-CM

## 2022-12-05 DIAGNOSIS — R768 Other specified abnormal immunological findings in serum: Secondary | ICD-10-CM

## 2022-12-05 DIAGNOSIS — M19041 Primary osteoarthritis, right hand: Secondary | ICD-10-CM | POA: Diagnosis not present

## 2022-12-05 DIAGNOSIS — M51369 Other intervertebral disc degeneration, lumbar region without mention of lumbar back pain or lower extremity pain: Secondary | ICD-10-CM | POA: Insufficient documentation

## 2022-12-05 DIAGNOSIS — D696 Thrombocytopenia, unspecified: Secondary | ICD-10-CM | POA: Diagnosis not present

## 2022-12-05 DIAGNOSIS — G8929 Other chronic pain: Secondary | ICD-10-CM

## 2022-12-05 DIAGNOSIS — M19042 Primary osteoarthritis, left hand: Secondary | ICD-10-CM

## 2022-12-05 NOTE — Patient Instructions (Addendum)
Hand Exercises Hand exercises can be helpful for almost anyone. They can strengthen your hands and improve flexibility and movement. The exercises can also increase blood flow to the hands. These results can make your work and daily tasks easier for you. Hand exercises can be especially helpful for people who have joint pain from arthritis or nerve damage from using their hands over and over. These exercises can also help people who injure a hand. Exercises Most of these hand exercises are gentle stretching and motion exercises. It is usually safe to do them often throughout the day. Warming up your hands before exercise may help reduce stiffness. You can do this with gentle massage or by placing your hands in warm water for 10-15 minutes. It is normal to feel some stretching, pulling, tightness, or mild discomfort when you begin new exercises. In time, this will improve. Remember to always be careful and stop right away if you feel sudden, very bad pain or your pain gets worse. You want to get better and be safe. Ask your health care provider which exercises are safe for you. Do exercises exactly as told by your provider and adjust them as told. Do not begin these exercises until told by your provider. Knuckle bend or "claw" fist  Stand or sit with your arm, hand, and all five fingers pointed straight up. Make sure to keep your wrist straight. Gently bend your fingers down toward your palm until the tips of your fingers are touching your palm. Keep your big knuckle straight and only bend the small knuckles in your fingers. Hold this position for 10 seconds. Straighten your fingers back to your starting position. Repeat this exercise 5-10 times with each hand. Full finger fist  Stand or sit with your arm, hand, and all five fingers pointed straight up. Make sure to keep your wrist straight. Gently bend your fingers into your palm until the tips of your fingers are touching the middle of your  palm. Hold this position for 10 seconds. Extend your fingers back to your starting position, stretching every joint fully. Repeat this exercise 5-10 times with each hand. Straight fist  Stand or sit with your arm, hand, and all five fingers pointed straight up. Make sure to keep your wrist straight. Gently bend your fingers at the big knuckle, where your fingers meet your hand, and at the middle knuckle. Keep the knuckle at the tips of your fingers straight and try to touch the bottom of your palm. Hold this position for 10 seconds. Extend your fingers back to your starting position, stretching every joint fully. Repeat this exercise 5-10 times with each hand. Tabletop  Stand or sit with your arm, hand, and all five fingers pointed straight up. Make sure to keep your wrist straight. Gently bend your fingers at the big knuckle, where your fingers meet your hand, as far down as you can. Keep the small knuckles in your fingers straight. Think of forming a tabletop with your fingers. Hold this position for 10 seconds. Extend your fingers back to your starting position, stretching every joint fully. Repeat this exercise 5-10 times with each hand. Finger spread  Place your hand flat on a table with your palm facing down. Make sure your wrist stays straight. Spread your fingers and thumb apart from each other as far as you can until you feel a gentle stretch. Hold this position for 10 seconds. Bring your fingers and thumb tight together again. Hold this position for 10 seconds. Repeat  this exercise 5-10 times with each hand. Making circles  Stand or sit with your arm, hand, and all five fingers pointed straight up. Make sure to keep your wrist straight. Make a circle by touching the tip of your thumb to the tip of your index finger. Hold for 10 seconds. Then open your hand wide. Repeat this motion with your thumb and each of your fingers. Repeat this exercise 5-10 times with each hand. Thumb  motion  Sit with your forearm resting on a table and your wrist straight. Your thumb should be facing up toward the ceiling. Keep your fingers relaxed as you move your thumb. Lift your thumb up as high as you can toward the ceiling. Hold for 10 seconds. Bend your thumb across your palm as far as you can, reaching the tip of your thumb for the small finger (pinkie) side of your palm. Hold for 10 seconds. Repeat this exercise 5-10 times with each hand. Grip strengthening  Hold a stress ball or other soft ball in the middle of your hand. Slowly increase the pressure, squeezing the ball as much as you can without causing pain. Think of bringing the tips of your fingers into the middle of your palm. All of your finger joints should bend when doing this exercise. Hold your squeeze for 10 seconds, then relax. Repeat this exercise 5-10 times with each hand. Contact a health care provider if: Your hand pain or discomfort gets much worse when you do an exercise. Your hand pain or discomfort does not improve within 2 hours after you exercise. If you have either of these problems, stop doing these exercises right away. Do not do them again unless your provider says that you can. Get help right away if: You develop sudden, severe hand pain or swelling. If this happens, stop doing these exercises right away. Do not do them again unless your provider says that you can. This information is not intended to replace advice given to you by your health care provider. Make sure you discuss any questions you have with your health care provider. Document Revised: 02/05/2022 Document Reviewed: 02/05/2022 [employer] Patient Education  2024 [employer] Exercises for Chronic Knee Pain Chronic knee pain is pain that lasts longer than 3 months. For most people with chronic knee pain, exercise and weight loss is an important part of treatment. Your health care provider may want you to focus on: Making the muscles that  support your knee stronger. This can take pressure off your knee and reduce pain. Preventing knee stiffness. How far you can move your knee, keeping it there or making it farther. Losing weight (if this applies) to take pressure off your knee, lower your risk for injury, and make it easier for you to exercise. Your provider will help you make an exercise program that fits your needs and physical abilities. Below are simple, low-impact exercises you can do at home. Ask your provider or physical therapist how often you should do your exercise program and how many times to repeat each exercise. General safety tips  Get your provider's approval before doing any exercises. Start slowly and stop any time you feel pain. Do not exercise if your knee pain is flaring up. Warm up first. Stretching a cold muscle can cause an injury. Do 5-10 minutes of easy movement or light stretching before beginning your exercises. Do 5-10 minutes of low-impact activity (like walking or cycling) before starting strengthening exercises. Contact your provider any time you have pain during  or after exercising. Exercise can cause discomfort but should not be painful. It is normal to be a little stiff or sore after exercising. Stretching and range-of-motion exercises Front thigh stretch  Stand up straight and support your body by holding on to a chair or resting one hand on a wall. With your legs straight and close together, bend one knee to lift your heel up toward your butt. Using one hand for support, grab your ankle with your free hand. Pull your foot up closer toward your butt to feel the stretch in front of your thigh. Hold the stretch for 30 seconds. Repeat __________ times. Complete this exercise __________ times a day. Back thigh stretch  Sit on the floor with your back straight and your legs out straight in front of you. Place the palms of your hands on the floor and slide them toward your feet as you bend at the  hip. Try to touch your nose to your knees and feel the stretch in the back of your thighs. Hold for 30 seconds. Repeat __________ times. Complete this exercise __________ times a day. Calf stretch  Stand facing a wall. Place the palms of your hands flat against the wall, arms extended, and lean slightly against the wall. Get into a lunge position with one leg bent at the knee and the other leg stretched out straight behind you. Keep both feet facing the wall and increase the bend in your knee while keeping the heel of the other leg flat on the ground. You should feel the stretch in your calf. Hold for 30 seconds. Repeat __________ times. Complete this exercise __________ times a day. Strengthening exercises Straight leg lift  Lie on your back with one knee bent and the other leg out straight. Slowly lift the straight leg without bending the knee. Lift until your foot is about 12 inches (30 cm) off the floor. Hold for 3-5 seconds and slowly lower your leg. Repeat __________ times. Complete this exercise __________ times a day. Single leg dip  Stand between two chairs and put both hands on the backs of the chairs for support. Extend one leg out straight with your body weight resting on the heel of the standing leg. Slowly bend your standing knee to dip your body to the level that is comfortable for you. Hold for 3-5 seconds. Repeat __________ times. Complete this exercise __________ times a day. Hamstring curls  Stand straight, knees close together, facing the back of a chair. Hold on to the back of a chair with both hands. Keep one leg straight. Bend the other knee while bringing the heel up toward the butt until the knee is bent at a 90-degree angle (right angle). Hold for 3-5 seconds. Repeat __________ times. Complete this exercise __________ times a day. Wall squat  Stand straight with your back, hips, and head against a wall. Step forward one foot at a time with your back  still against the wall. Your feet should be 2 feet (61 cm) from the wall at shoulder width. Keeping your back, hips, and head against the wall, slide down the wall to as close to a sitting position as you can get. Hold for 5-10 seconds, then slowly slide back up. Repeat __________ times. Complete this exercise __________ times a day. Step-ups  Stand in front of a sturdy platform or stool that is about 6 inches (15 cm) high. Slowly step up with your left / right foot, keeping your knee in line with your hip  and foot. Do not let your knee bend so far that you cannot see your toes. Hold on to a chair for balance, but do not use it for support. Slowly unlock your knee and lower yourself to the starting position. Repeat __________ times. Complete this exercise __________ times a day. Contact a health care provider if: Your exercises cause pain. Your pain is worse after you exercise. Your pain prevents you from doing your exercises. This information is not intended to replace advice given to you by your health care provider. Make sure you discuss any questions you have with your health care provider. Document Revised: 02/05/2022 Document Reviewed: 02/05/2022 Else[employer] Patient Education  2024 [employer] Low Back Sprain or Strain Rehab Ask your health care provider which exercises are safe for you. Do exercises exactly as told by your health care provider and adjust them as directed. It is normal to feel mild stretching, pulling, tightness, or discomfort as you do these exercises. Stop right away if you feel sudden pain or your pain gets worse. Do not begin these exercises until told by your health care provider. Stretching and range-of-motion exercises These exercises warm up your muscles and joints and improve the movement and flexibility of your back. These exercises also help to relieve pain, numbness, and tingling. Lumbar rotation  Lie on your back on a firm bed or the floor with your knees  bent. Straighten your arms out to your sides so each arm forms a 90-degree angle (right angle) with a side of your body. Slowly move (rotate) both of your knees to one side of your body until you feel a stretch in your lower back (lumbar). Try not to let your shoulders lift off the floor. Hold this position for __________ seconds. Tense your abdominal muscles and slowly move your knees back to the starting position. Repeat this exercise on the other side of your body. Repeat __________ times. Complete this exercise __________ times a day. Single knee to chest  Lie on your back on a firm bed or the floor with both legs straight. Bend one of your knees. Use your hands to move your knee up toward your chest until you feel a gentle stretch in your lower back and buttock. Hold your leg in this position by holding on to the front of your knee. Keep your other leg as straight as possible. Hold this position for __________ seconds. Slowly return to the starting position. Repeat with your other leg. Repeat __________ times. Complete this exercise __________ times a day. Prone extension on elbows  Lie on your abdomen on a firm bed or the floor (prone position). Prop yourself up on your elbows. Use your arms to help lift your chest up until you feel a gentle stretch in your abdomen and your lower back. This will place some of your body weight on your elbows. If this is uncomfortable, try stacking pillows under your chest. Your hips should stay down, against the surface that you are lying on. Keep your hip and back muscles relaxed. Hold this position for __________ seconds. Slowly relax your upper body and return to the starting position. Repeat __________ times. Complete this exercise __________ times a day. Strengthening exercises These exercises build strength and endurance in your back. Endurance is the ability to use your muscles for a long time, even after they get tired. Pelvic tilt This  exercise strengthens the muscles that lie deep in the abdomen. Lie on your back on a firm bed or the  floor with your legs extended. Bend your knees so they are pointing toward the ceiling and your feet are flat on the floor. Tighten your lower abdominal muscles to press your lower back against the floor. This motion will tilt your pelvis so your tailbone points up toward the ceiling instead of pointing to your feet or the floor. To help with this exercise, you may place a small towel under your lower back and try to push your back into the towel. Hold this position for __________ seconds. Let your muscles relax completely before you repeat this exercise. Repeat __________ times. Complete this exercise __________ times a day. Alternating arm and leg raises  Get on your hands and knees on a firm surface. If you are on a hard floor, you may want to use padding, such as an exercise mat, to cushion your knees. Line up your arms and legs. Your hands should be directly below your shoulders, and your knees should be directly below your hips. Lift your left leg behind you. At the same time, raise your right arm and straighten it in front of you. Do not lift your leg higher than your hip. Do not lift your arm higher than your shoulder. Keep your abdominal and back muscles tight. Keep your hips facing the ground. Do not arch your back. Keep your balance carefully, and do not hold your breath. Hold this position for __________ seconds. Slowly return to the starting position. Repeat with your right leg and your left arm. Repeat __________ times. Complete this exercise __________ times a day. Abdominal set with straight leg raise  Lie on your back on a firm bed or the floor. Bend one of your knees and keep your other leg straight. Tense your abdominal muscles and lift your straight leg up, 4-6 inches (10-15 cm) off the ground. Keep your abdominal muscles tight and hold this position for __________  seconds. Do not hold your breath. Do not arch your back. Keep it flat against the ground. Keep your abdominal muscles tense as you slowly lower your leg back to the starting position. Repeat with your other leg. Repeat __________ times. Complete this exercise __________ times a day. Single leg lower with bent knees Lie on your back on a firm bed or the floor. Tense your abdominal muscles and lift your feet off the floor, one foot at a time, so your knees and hips are bent in 90-degree angles (right angles). Your knees should be over your hips and your lower legs should be parallel to the floor. Keeping your abdominal muscles tense and your knee bent, slowly lower one of your legs so your toe touches the ground. Lift your leg back up to return to the starting position. Do not hold your breath. Do not let your back arch. Keep your back flat against the ground. Repeat with your other leg. Repeat __________ times. Complete this exercise __________ times a day. Posture and body mechanics Good posture and healthy body mechanics can help to relieve stress in your body's tissues and joints. Body mechanics refers to the movements and positions of your body while you do your daily activities. Posture is part of body mechanics. Good posture means: Your spine is in its natural S-curve position (neutral). Your shoulders are pulled back slightly. Your head is not tipped forward (neutral). Follow these guidelines to improve your posture and body mechanics in your everyday activities. Standing  When standing, keep your spine neutral and your feet about hip-width apart. Keep a  slight bend in your knees. Your ears, shoulders, and hips should line up. When you do a task in which you stand in one place for a long time, place one foot up on a stable object that is 2-4 inches (5-10 cm) high, such as a footstool. This helps keep your spine neutral. Sitting  When sitting, keep your spine neutral and keep your  feet flat on the floor. Use a footrest, if necessary, and keep your thighs parallel to the floor. Avoid rounding your shoulders, and avoid tilting your head forward. When working at a desk or a computer, keep your desk at a height where your hands are slightly lower than your elbows. Slide your chair under your desk so you are close enough to maintain good posture. When working at a computer, place your monitor at a height where you are looking straight ahead and you do not have to tilt your head forward or downward to look at the screen. Resting When lying down and resting, avoid positions that are most painful for you. If you have pain with activities such as sitting, bending, stooping, or squatting, lie in a position in which your body does not bend very much. For example, avoid curling up on your side with your arms and knees near your chest (fetal position). If you have pain with activities such as standing for a long time or reaching with your arms, lie with your spine in a neutral position and bend your knees slightly. Try the following positions: Lying on your side with a pillow between your knees. Lying on your back with a pillow under your knees. Lifting  When lifting objects, keep your feet at least shoulder-width apart and tighten your abdominal muscles. Bend your knees and hips and keep your spine neutral. It is important to lift using the strength of your legs, not your back. Do not lock your knees straight out. Always ask for help to lift heavy or awkward objects. This information is not intended to replace advice given to you by your health care provider. Make sure you discuss any questions you have with your health care provider. Document Revised: 05/27/2022 Document Reviewed: 04/10/2020 Elsevier Patient Education  2024 [employer].

## 2022-12-06 ENCOUNTER — Other Ambulatory Visit (INDEPENDENT_AMBULATORY_CARE_PROVIDER_SITE_OTHER): Payer: 59

## 2022-12-06 ENCOUNTER — Encounter: Payer: Self-pay | Admitting: Family Medicine

## 2022-12-06 DIAGNOSIS — R748 Abnormal levels of other serum enzymes: Secondary | ICD-10-CM

## 2022-12-06 LAB — LIPASE: Lipase: 33 U/L (ref 11.0–59.0)

## 2022-12-13 ENCOUNTER — Ambulatory Visit: Payer: 59 | Attending: Cardiology | Admitting: Cardiology

## 2022-12-13 ENCOUNTER — Encounter: Payer: Self-pay | Admitting: Cardiology

## 2022-12-13 VITALS — BP 106/64 | HR 60 | Ht 63.0 in | Wt 194.8 lb

## 2022-12-13 DIAGNOSIS — I471 Supraventricular tachycardia, unspecified: Secondary | ICD-10-CM | POA: Diagnosis not present

## 2022-12-13 DIAGNOSIS — R002 Palpitations: Secondary | ICD-10-CM

## 2022-12-13 DIAGNOSIS — R0609 Other forms of dyspnea: Secondary | ICD-10-CM

## 2022-12-13 NOTE — Patient Instructions (Signed)
Medication Instructions:  Your physician recommends that you continue on your current medications as directed. Please refer to the Current Medication list given to you today.  *If you need a refill on your cardiac medications before your next appointment, please call your pharmacy*   Lab Work: None Ordered If you have labs (blood work) drawn today and your tests are completely normal, you will receive your results only by: MyChart Message (if you have MyChart) OR A paper copy in the mail If you have any lab test that is abnormal or we need to change your treatment, we will call you to review the results.   Testing/Procedures: Your physician has requested that you have an echocardiogram. Echocardiography is a painless test that uses sound waves to create images of your heart. It provides your doctor with information about the size and shape of your heart and how well your heart's chambers and valves are working. This procedure takes approximately one hour. There are no restrictions for this procedure. Please do NOT wear cologne, perfume, aftershave, or lotions (deodorant is allowed). Please arrive 15 minutes prior to your appointment time.  Please note: We ask at that you not bring children with you during ultrasound (echo/ vascular) testing. Due to room size and safety concerns, children are not allowed in the ultrasound rooms during exams. Our front office staff cannot provide observation of children in our lobby area while testing is being conducted. An adult accompanying a patient to their appointment will only be allowed in the ultrasound room at the discretion of the ultrasound technician under special circumstances. We apologize for any inconvenience.    Follow-Up: At [facility], you and your health needs are our priority.  As part of our continuing mission to provide you with exceptional heart care, we have created designated Provider Care Teams.  These Care Teams include your  primary Cardiologist (physician) and Advanced Practice Providers (APPs -  Physician Assistants and Nurse Practitioners) who all work together to provide you with the care you need, when you need it.  We recommend signing up for the patient portal called "MyChart".  Sign up information is provided on this After Visit Summary.  MyChart is used to connect with patients for Virtual Visits (Telemedicine).  Patients are able to view lab/test results, encounter notes, upcoming appointments, etc.  Non-urgent messages can be sent to your provider as well.   To learn more about what you can do with MyChart, go to [email or web address].    Your next appointment:   2 month(s)  The format for your next appointment:   In Person  Provider:   Gypsy Balsam, MD    Other Instructions NA

## 2022-12-13 NOTE — Progress Notes (Unsigned)
Cardiology Consultation:    Date:  12/13/2022   ID:  Amanda Maynard, DOB [birth year], MRN [id number]  PCP:  Pearline Cables, MD  Cardiologist:  Gypsy Balsam, MD   Referring MD: Pearline Cables, MD   Chief Complaint  Patient presents with   Palpitations    History of Present Illness:    Amanda Maynard is a 62 y.o. female who is being seen today for the evaluation of palpitations at the request of [name], Gwenlyn Found, MD. past medical history significant for emphysema, GERD, diverticulosis, anxiety.  She was referred to [place] because of episodes of palpitations however which she described sure if it is related to the heart rhythm she said when she sits quietly sometimes she got kind of electric jolts that goes across her chest.  She did wear monitor which shows some evidence of supraventricular tachycardia but short lasting and asymptomatic.  Otherwise she seems to be doing well.  She is very energetic she walks a lot she have no difficulty doing it.  I have an honor of taking care of her mother and under her for years already.  She did have a calcium score done last year which was 0, denies have any chest pain tightness squeezing pressure burning chest.  There are some fatigue tiredness and shortness of breath while walking she is not on any special diet.  She does not exercise on the regular basis but she is very active.  She does have arthritis she is being followed by rheumatologist but, apparently no rheumatoid arthritis identified.  Minimal elevation of cholesterol with LDL of 119 HDL 55  Past Medical History:  Diagnosis Date   Allergy    Anxiety    Arthritis    Chicken pox    Depression    Diverticulitis    Emphysema of lung (HCC)    GERD (gastroesophageal reflux disease)    Headache    History of colon polyps    Jaundice    Lichen planopilaris    Thyroid disease    Urinary incontinence     Past Surgical History:  Procedure Laterality Date   ABDOMINAL  HYSTERECTOMY  2012   APPENDECTOMY  2009   COLONOSCOPY  12/02/2016   Dr. Vista Mink. Moderate diverticulosis of the ascending colon, transverse colon, descending colon and sigmoid colon, increase in vascularity of the rectum. External hemorrhoids.   COLONOSCOPY  08/09/2013   Dr. Vista Mink. Moderate diverticulosis of the hepatic flexure, transverse colon, splenic flexure, descending colon and sigmoid colon. Polyp (7mm) in the proximal sigmoid colon. (polypectomy). External hemorrhoids.   ESOPHAGOGASTRODUODENOSCOPY  08/09/2013   Dr. Vista Mink. Normal stomach. Normal duodenum. Grade 1 esophagitis was seen in the lower third of the esophagus.    Current Medications: Current Meds  Medication Sig   escitalopram (LEXAPRO) 10 MG tablet Take 1 tablet (10 mg total) by mouth daily.   IBUPROFEN PO Take 1 tablet by mouth as needed (Pain).   meloxicam (MOBIC) 7.5 MG tablet Take 1 tablet (7.5 mg total) by mouth daily. (Patient taking differently: Take 7.5 mg by mouth as needed for pain.)   pantoprazole (PROTONIX) 40 MG tablet Take 1 tablet (40 mg total) by mouth daily. (Patient taking differently: Take 40 mg by mouth as needed (Indigestion).)   SYNTHROID 75 MCG tablet Take 1 tablet (75 mcg total) by mouth daily before breakfast.     Allergies:   Molds & smuts, Sertraline, and Sulfa antibiotics   Social History  Socioeconomic History   Marital status: Divorced    Spouse name: Not on file   Number of children: Not on file   Years of education: Not on file   Highest education level: Some college, no degree  Occupational History   Not on file  Tobacco Use   Smoking status: Every Day    Current packs/day: 0.50    Average packs/day: 0.5 packs/day for 30.0 years (15.0 ttl pk-yrs)    Types: Cigarettes    Passive exposure: Past   Smokeless tobacco: Not on file   Tobacco comments:    Have quit twice in 30 years but start again when stress level is too high  Vaping Use   Vaping status: Never Used   Substance and Sexual Activity   Alcohol use: Yes    Alcohol/week: 6.0 standard drinks of alcohol    Types: 3 Cans of beer, 3 Standard drinks or equivalent per week    Comment: occ   Drug use: Never   Sexual activity: Not Currently    Birth control/protection: Abstinence, None    Comment: Had hysterectomy and no sex drive since the surgery  Other Topics Concern   Not on file  Social History Narrative   Not on file   Social Determinants of Health   Financial Resource Strain: Low Risk  (10/30/2022)   Overall Financial Resource Strain (CARDIA)    Difficulty of Paying Living Expenses: Not very hard  Food Insecurity: No Food Insecurity (10/30/2022)   Hunger Vital Sign    Worried About Running Out of Food in the Last Year: Never true    Ran Out of Food in the Last Year: Never true  Transportation Needs: No Transportation Needs (10/30/2022)   PRAPARE - Administrator, Civil Service (Medical): No    Lack of Transportation (Non-Medical): No  Physical Activity: Insufficiently Active (10/30/2022)   Exercise Vital Sign    Days of Exercise per Week: 7 days    Minutes of Exercise per Session: 20 min  Stress: Stress Concern Present (10/30/2022)   [employer] of Occupational Health - Occupational Stress Questionnaire    Feeling of Stress : Rather much  Social Connections: Moderately Isolated (10/30/2022)   Social Connection and Isolation Panel [NHANES]    Frequency of Communication with Friends and Family: More than three times a week    Frequency of Social Gatherings with Friends and Family: More than three times a week    Attends Religious Services: 1 to 4 times per year    Active Member of [employer] or Organizations: No    Attends Engineer, structural: Not on file    Marital Status: Divorced     Family History: The patient's family history includes Arthritis in her mother and paternal grandmother; Breast cancer (age of onset: 70) in her mother; Cancer in her father,  maternal grandfather, mother, and paternal grandfather; Depression in her mother, sister, and sister; Hearing loss in her mother; Heart attack in her paternal grandfather; Heart disease in her maternal grandmother, mother, and sister; Hyperlipidemia in her father; Rheum arthritis in her paternal grandmother. There is no history of Esophageal cancer, Colon cancer, Stomach cancer, or Rectal cancer. ROS:   Please see the history of present illness.    All 14 point review of systems negative except as described per history of present illness.  EKGs/Labs/Other Studies Reviewed:    The following studies were reviewed today: Monitor shows 2 short episode of supraventricular tachycardia asymptomatic  EKG:  EKG Interpretation Date/Time:  Friday December 13 2022 10:39:03 EST Ventricular Rate:  60 PR Interval:  128 QRS Duration:  86 QT Interval:  416 QTC Calculation: 416 R Axis:   66  Text Interpretation: Normal sinus rhythm Normal ECG No previous ECGs available Confirmed by Gypsy Balsam [phone]) on 12/13/2022 10:44:47 AM    Recent Labs: 10/30/2022: ALT 12; BUN 13; Creatinine, Ser 0.78; Hemoglobin 12.8; Platelets 90.0; Potassium 3.8; Sodium 140; TSH 0.93  Recent Lipid Panel    Component Value Date/Time   CHOL 199 04/22/2022 0956   TRIG 121.0 04/22/2022 0956   HDL 55.90 04/22/2022 0956   CHOLHDL 4 04/22/2022 0956   VLDL 24.2 04/22/2022 0956   LDLCALC 119 (H) 04/22/2022 0956    Physical Exam:    VS:  BP 106/64 (BP Location: Left Arm, Patient Position: Sitting)   Pulse 60   Ht 5\' 3"  (1.6 m)   Wt 194 lb 12.8 oz (88.4 kg)   SpO2 98%   BMI 34.51 kg/m     Wt Readings from Last 3 Encounters:  12/13/22 194 lb 12.8 oz (88.4 kg)  12/05/22 192 lb 6.4 oz (87.3 kg)  10/30/22 194 lb 9.6 oz (88.3 kg)     GEN:  Well nourished, well developed in no acute distress HEENT: Normal NECK: No JVD; No carotid bruits LYMPHATICS: No lymphadenopathy CARDIAC: RRR, no murmurs, no rubs, no  gallops RESPIRATORY:  Clear to auscultation without rales, wheezing or rhonchi  ABDOMEN: Soft, non-tender, non-distended MUSCULOSKELETAL:  No edema; No deformity  SKIN: Warm and dry NEUROLOGIC:  Alert and oriented x 3 PSYCHIATRIC:  Normal affect   ASSESSMENT:    1. Palpitations   2. Supraventricular tachycardia (HCC)    PLAN:    In order of problems listed above:  Palpitations: Sure exactly what she described I am not sure if this is truly palpitation related to rhythm abnormality I offered her a small dose of beta-blocker but she said she want a wait and see how things will evolve.  As a part evaluation echocardiogram to be done to look at left ventricle ejection fraction. Supraventricular tachycardia asymptomatic I do not think we need to manage this right now. Dyslipidemia with her calcium score 0 low risk for coronary events, therefore there is no indication for statin therapy. We did talk about exercising on regular basis and good lifestyle.  I will see her back in my office in about 2 months   Medication Adjustments/Labs and Tests Ordered: Current medicines are reviewed at length with the patient today.  Concerns regarding medicines are outlined above.  Orders Placed This Encounter  Procedures   EKG 12-Lead   No orders of the defined types were placed in this encounter.   Signed, Georgeanna Lea, MD, [facility]. 12/13/2022 11:08 AM    [facility]

## 2022-12-23 ENCOUNTER — Other Ambulatory Visit (HOSPITAL_BASED_OUTPATIENT_CLINIC_OR_DEPARTMENT_OTHER): Payer: Self-pay

## 2022-12-23 ENCOUNTER — Other Ambulatory Visit: Payer: Self-pay | Admitting: Family Medicine

## 2022-12-23 DIAGNOSIS — F32A Depression, unspecified: Secondary | ICD-10-CM

## 2022-12-23 MED ORDER — ESCITALOPRAM OXALATE 10 MG PO TABS
10.0000 mg | ORAL_TABLET | Freq: Every day | ORAL | 0 refills | Status: DC
  Filled 2022-12-23: qty 30, 30d supply, fill #0
  Filled 2023-01-24: qty 30, 30d supply, fill #1
  Filled 2023-02-20: qty 30, 30d supply, fill #2

## 2023-01-01 ENCOUNTER — Encounter (INDEPENDENT_AMBULATORY_CARE_PROVIDER_SITE_OTHER): Payer: 59 | Admitting: Family Medicine

## 2023-01-01 DIAGNOSIS — J0110 Acute frontal sinusitis, unspecified: Secondary | ICD-10-CM

## 2023-01-01 MED ORDER — PREDNISONE 20 MG PO TABS
ORAL_TABLET | ORAL | 0 refills | Status: DC
Start: 2023-01-01 — End: 2023-02-14

## 2023-01-01 MED ORDER — AMOXICILLIN 500 MG PO CAPS
1000.0000 mg | ORAL_CAPSULE | Freq: Two times a day (BID) | ORAL | 0 refills | Status: DC
Start: 2023-01-01 — End: 2023-02-14

## 2023-01-01 NOTE — Telephone Encounter (Signed)

## 2023-01-16 ENCOUNTER — Ambulatory Visit: Payer: 59 | Attending: Cardiology

## 2023-01-16 DIAGNOSIS — R0609 Other forms of dyspnea: Secondary | ICD-10-CM | POA: Diagnosis not present

## 2023-01-16 LAB — ECHOCARDIOGRAM COMPLETE
AR max vel: 2.51 cm2
AV Area VTI: 2.33 cm2
AV Area mean vel: 2.31 cm2
AV Mean grad: 6 mm[Hg]
AV Peak grad: 10.9 mm[Hg]
Ao pk vel: 1.65 m/s
Area-P 1/2: 3.59 cm2
S' Lateral: 2.8 cm

## 2023-02-04 ENCOUNTER — Telehealth: Payer: Self-pay

## 2023-02-04 NOTE — Telephone Encounter (Signed)
 Pt viewed Echo results on My Chart per Dr. Vanetta Shawl note. Routed to PCP.

## 2023-02-04 NOTE — Telephone Encounter (Signed)
 Left message on My Chart with normal Echo results per Dr. Vanetta Shawl note. Routed to PCP

## 2023-02-12 ENCOUNTER — Ambulatory Visit: Payer: 59 | Admitting: Cardiology

## 2023-02-12 ENCOUNTER — Other Ambulatory Visit (HOSPITAL_BASED_OUTPATIENT_CLINIC_OR_DEPARTMENT_OTHER): Payer: Self-pay | Admitting: Family Medicine

## 2023-02-12 DIAGNOSIS — Z1231 Encounter for screening mammogram for malignant neoplasm of breast: Secondary | ICD-10-CM

## 2023-02-14 ENCOUNTER — Encounter: Payer: Self-pay | Admitting: Gastroenterology

## 2023-02-14 ENCOUNTER — Other Ambulatory Visit (INDEPENDENT_AMBULATORY_CARE_PROVIDER_SITE_OTHER): Payer: 59

## 2023-02-14 ENCOUNTER — Ambulatory Visit: Payer: 59 | Admitting: Gastroenterology

## 2023-02-14 ENCOUNTER — Other Ambulatory Visit: Payer: Self-pay | Admitting: Gastroenterology

## 2023-02-14 VITALS — BP 140/80 | HR 70 | Ht 63.0 in | Wt 193.0 lb

## 2023-02-14 DIAGNOSIS — R1013 Epigastric pain: Secondary | ICD-10-CM

## 2023-02-14 DIAGNOSIS — G8929 Other chronic pain: Secondary | ICD-10-CM

## 2023-02-14 DIAGNOSIS — Z8619 Personal history of other infectious and parasitic diseases: Secondary | ICD-10-CM | POA: Diagnosis not present

## 2023-02-14 DIAGNOSIS — R197 Diarrhea, unspecified: Secondary | ICD-10-CM

## 2023-02-14 DIAGNOSIS — K2970 Gastritis, unspecified, without bleeding: Secondary | ICD-10-CM

## 2023-02-14 DIAGNOSIS — F1090 Alcohol use, unspecified, uncomplicated: Secondary | ICD-10-CM | POA: Diagnosis not present

## 2023-02-14 DIAGNOSIS — R9389 Abnormal findings on diagnostic imaging of other specified body structures: Secondary | ICD-10-CM

## 2023-02-14 DIAGNOSIS — R748 Abnormal levels of other serum enzymes: Secondary | ICD-10-CM

## 2023-02-14 DIAGNOSIS — D696 Thrombocytopenia, unspecified: Secondary | ICD-10-CM | POA: Diagnosis not present

## 2023-02-14 DIAGNOSIS — R932 Abnormal findings on diagnostic imaging of liver and biliary tract: Secondary | ICD-10-CM

## 2023-02-14 DIAGNOSIS — Z8711 Personal history of peptic ulcer disease: Secondary | ICD-10-CM

## 2023-02-14 LAB — LIPASE: Lipase: 28 U/L (ref 11.0–59.0)

## 2023-02-14 LAB — PROTIME-INR
INR: 1.2 {ratio} — ABNORMAL HIGH (ref 0.8–1.0)
Prothrombin Time: 12.7 s (ref 9.6–13.1)

## 2023-02-14 NOTE — Progress Notes (Signed)
 Chief Complaint:    Elevated lipase, abnormal imaging  GI History:  63 y.o. female with a history of HCV (treated with Mavyret in 2018 with SVR), tobacco use, hypothyroidism, anxiety, depression, emphysema, hysterectomy, appendectomy, follows in the GI clinic for the following:  1) GERD: Longstanding history of reflux for 8+ years. Index sxs of HB, regurgitation. No dysphagia. Worse with tomato/tomato-based sauces, onions, peppers.  Has trialed Pepcid, Tums.  2) History of colon polyps.    3) History of HCV treated with Mavyret in 2018 with established SVR per patient. Liver enzymes normal in 2022 and 2023 (T. bili 1.4)  Endoscopic History: - 7/ 6/ 2015: Colonoscopy: Moderate diverticulosis throughout, 7 mm villous adenoma removed from proximal sigmoid colon, external hemorrhoids - 10/ 29/ 2018: Colonoscopy: moderate diverticulosis throughout the colon. 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 Small nonbleeding external hemorrhoids. Repeat in 5 years - 06/01/2021: EGD: Normal esophagus, moderate gastritis with antral erosions, normal duodenum.  Treated with Protonix  40 mg twice daily x 8 weeks, then reduced to 40 mg daily along with Carafate  x 4 weeks - 06/01/2021: Colonoscopy: Tortuous sigmoid, pandiverticulosis, medium size internal hemorrhoids, normal TI.  Repeat 10 years  HPI:     Patient is a 63 y.o. female presenting to the Gastroenterology Clinic for evaluation of elevated lipase and recent abnormal imaging.  Was seen by me in the office 1 time on 04/27/2021 to establish care and subsequently completed EGD for GERD and colonoscopy for polyp surveillance as outlined above.  Lipase was checked in 10/2022 for evaluation of recurrent MEG pain. Dr. Ubaldo did labs and subsequent imaging as below.   Patient stopped drinking after first lipase level with subsequent improvement.  Reviewed most recent labs/rads as below: - 04/23/2021: Abdominal ultrasound: Heterogenous and coarsened liver echotexture and  mild surface irregularity consistent with changes of cirrhosis.  Patent PV.  Splenomegaly measuring up to 15 cm - 05/09/2021: INR 1.1 - 10/30/2022: Normal CMP, TSH.  PLT 90 (stable from previous), otherwise normal CBC.  Lipase 256 - 11/05/2022: Lipase 130 - 11/05/2022: CT A/P: Normal-appearing pancreas without PD dilation.  Hepatic cirrhosis without mass or duct dilation.  No ascites.  Normal GB.  Mild to moderate splenomegaly consistent with portal venous hypertension.  Diverticulosis without diverticulitis and otherwise normal-appearing GI tract. - 12/06/2022: Lipase 33  Today she states the epigastric pain has not recurred.   More recently, has been having diarrhea since July. Typically related to dietary indiscretions, and thinks could be related to eating red meat, but not entirely sure.   Only takes Protonix  prn heartburn, which is rare.   Drinks most nights, >15 drinks/week.   Reviewed records from prior GI notable for the following: - 06/23/2013: Initial appointment in GI clinic for evaluation of chronic RUQ pain (with fried foods) for the last few years.  Also with increased reflux symptoms and episodic nausea.  Also with BRBPR and associated burning/itching.  No previous EGD/colonoscopy at that point.  Since recent ultrasound was reportedly normal (that record not available for review) - 08/09/2013: EGD: Mild, grade 1 esophagitis, (path from gastric biopsies benign mucosa, H. pylori negative).  Started on PPI - 08/09/2013: Colonoscopy: Moderate diverticulosis throughout, 7 mm villous adenoma removed from proximal sigmoid colon, external hemorrhoids - 10/07/2013: HIDA scan: Normal with 80% ejection fraction - 10/08/2013: GI follow-up appointment.  Recent HCV positive. 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  BUN/creatinine 6/0.84, sodium 143, potassium 3.7, AST/ALT 66/33,  T. bili 1.0, ALP 89.  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  Slight heterogenous enhancement of the hepatic parenchyma without gross focal lesion.  Unremarkable GB, pancreas.  Splenomegaly.  Diverticulosis without diverticulitis.  No ascites. - 12/02/2016: Colonoscopy: moderate diverticulosis throughout the colon.  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  Small nonbleeding external hemorrhoids.  Repeat in 5 years - 12/13/2016: WBC 3.7, H/H11.6/36.6, MCV/RDW 82/22.5, PLT 93.  BUN/creatinine 6/0.62, sodium 142, potassium 4.1, AST/ALT 55/22, T. bili 1.2, ALP 96.  HCV negative   - 03/15/2017: Protein 8.0, albumin 4.1, T. bili 1.2, direct bili 0.33, AST/ALT 50/23, ALP 80.  HCV negative. - 04/02/2017: CT abdomen/pelvis: Mildly enlarged liver measuring 18 cm.  Nodular contour is again seen, compatible with cirrhosis.  Normal attenuation.  No liver lesion identified.  Mild contraction of the GB with mild wall thickening of 4 mm.  No pericholecystic fluid, gallstones, sludge.  No duct dilation.  Enlarged spleen measuring 17.3 cm.  A few tiny splenic cysts.  Normal pancreas without PD dilation.  Focal enteritis of the loop of jejunum within the central abdomen and the terminal ileum.  Mild portal hypertension with splenomegaly, enlarged main portal vein, enlarged splenic vein. - 08/21/2017: FibroSure: Score 0.55, stage F2 (bridging fibrosis with few septa), activity 0.  Protein 7.3, albumin 4.1, T. bili 0.9, AST/ALT 27/15, ALP 70.  HCV negative  Review of systems:     No chest pain, no SOB, no fevers, no urinary sx   Past Medical History:  Diagnosis Date  . Allergy   . Anxiety   . Arthritis   . Chicken pox   . Depression   . Diverticulitis   . Emphysema of lung (HCC)   . GERD (gastroesophageal reflux disease)   . Headache   . History of colon polyps   . Jaundice   . Lichen planopilaris   . Thyroid  disease   . Urinary incontinence     Patient's surgical history, family medical history, social history,  medications and allergies were all reviewed in Epic    Current Outpatient Medications  Medication Sig Dispense Refill  . amoxicillin  (AMOXIL ) 500 MG capsule Take 2 capsules (1,000 mg total) by mouth 2 (two) times daily. 40 capsule 0  . escitalopram  (LEXAPRO ) 10 MG tablet Take 1 tablet (10 mg total) by mouth daily. 90 tablet 0  . IBUPROFEN  PO Take 1 tablet by mouth as needed (Pain).    . meloxicam  (MOBIC ) 7.5 MG tablet Take 1 tablet (7.5 mg total) by mouth daily. (Patient taking differently: Take 7.5 mg by mouth as needed for pain.) 30 tablet 0  . pantoprazole  (PROTONIX ) 40 MG tablet Take 1 tablet (40 mg total) by mouth daily. (Patient taking differently: Take 40 mg by mouth as needed (Indigestion).) 30 tablet 0  . predniSONE  (DELTASONE ) 20 MG tablet Take 40 mg by mouth daily for 3 to 5 days (Patient taking differently: Take 20 mg by mouth See admin instructions. Take 40 mg by mouth daily for 3 to 5 days) 10 tablet 0  . SYNTHROID  75 MCG tablet Take 1 tablet (75 mcg total) by mouth daily before breakfast. 90 tablet 1   No current facility-administered medications for this visit.    Physical Exam:     There were no vitals taken for this visit.  GENERAL:  Pleasant female in NAD PSYCH: : Cooperative, normal affect CARDIAC:  RRR, no murmur heard, no peripheral edema PULM: Normal respiratory effort, lungs CTA  bilaterally, no wheezing ABDOMEN:  Nondistended, soft, nontender. No obvious masses, no hepatomegaly,  normal bowel sounds SKIN:  turgor, no lesions seen Musculoskeletal:  Normal muscle tone, normal strength NEURO: Alert and oriented x 3, no focal neurologic deficits   IMPRESSION and PLAN:    1) Epigastric pain 2) Elevated lipase 3) History of gastritis Recent episodic epigastric pain.  Previous EGD 05/2021 with moderate non-H. pylori gastritis and antral ulcers.  Was treated with high-dose PPI, which she has since stopped.  Does consume >15 alcoholic beverages/week.  Discussed full  DDx with her today.  Lipase has since normalized.  - EGD to evaluate for resolution of previously noted gastritis - Counseled on EtOH consumption - Depending on evaluation, briefly discussed the role/utility of EUS  4) Abnormal imaging 5) Thrombocytopenia CT in 11/2022 and ultrasound from 04/2021 concerning for cirrhotic appearing liver and splenomegaly concerning for portal hypertension.  Similar to CT from 11/2016 and 03/2017 by previous GI.  Aside from thrombocytopenia (PLT 90 and stable since 2018), no other significant lab abnormality including normal liver enzymes, albumin, renal function, and previously normal PT/INR in 2023. - EGD to evaluate for esophageal varices - Repeat PT/INR - Ultrasound elastography - Recent negative ANA and TSH.  Will complete full serologic workup for other concomitant liver disease - Counseled on EtOH consumption  6) Diarrhea Episodic nonbloody diarrhea.  Tends to be related to dietary indiscretions. - Check alpha gal - Fiber supplement - Diet diary - Evaluate for medical/luminal pathology at time of EGD as above - Colonoscopy completed 05/2021    RTC in 6 months or sooner as needed  Sandor LULLA Flatter ,DO, FACG 02/14/2023, 1:34 PM

## 2023-02-14 NOTE — Patient Instructions (Signed)
 You have been scheduled for an abdominal ultrasound at [facility] (1st floor of hospital) on 02/21/23 at 8:30 am . Please arrive 30 minutes prior to your appointment for registration. Make certain not to have anything to eat or drink 6 hours prior to your appointment. Should you need to reschedule your appointment, please contact radiology at [phone]. This test typically takes about 30 minutes to perform.  Your provider has requested that you go to the basement level for lab work before leaving today. Press B on the elevator. The lab is located at the first door on the left as you exit the elevator.   You have been scheduled for an endoscopy. Please follow written instructions given to you at your visit today.  If you use inhalers (even only as needed), please bring them with you on the day of your procedure.  If you take any of the following medications, they will need to be adjusted prior to your procedure:   DO NOT TAKE 7 DAYS PRIOR TO TEST- Trulicity (dulaglutide) Ozempic, Wegovy (semaglutide) Mounjaro (tirzepatide) Bydureon Bcise (exanatide extended release)  DO NOT TAKE 1 DAY PRIOR TO YOUR TEST Rybelsus (semaglutide) Adlyxin (lixisenatide) Victoza (liraglutide) Byetta (exanatide) ___________________________________________________________________________    Due to recent changes in healthcare laws, you may see the results of your imaging and laboratory studies on MyChart before your provider has had a chance to review them.  We understand that in some cases there may be results that are confusing or concerning to you. Not all laboratory results come back in the same time frame and the provider may be waiting for multiple results in order to interpret others.  Please give us  48 hours in order for your provider to thoroughly review all the results before contacting the office for clarification of your results.    _______________________________________________________  If your blood pressure at your visit was 140/90 or greater, please contact your primary care physician to follow up on this.  _______________________________________________________  If you are age 43 or older, your body mass index should be between 23-30. Your Body mass index is 34.19 kg/m. If this is out of the aforementioned range listed, please consider follow up with your Primary Care Provider.  If you are age 81 or younger, your body mass index should be between 19-25. Your Body mass index is 34.19 kg/m. If this is out of the aformentioned range listed, please consider follow up with your Primary Care Provider.   ________________________________________________________  The [facility] providers would like to encourage you to use MYCHART to communicate with providers for non-urgent requests or questions.  Due to long hold times on the telephone, sending your provider a message by [facility] may be a faster and more efficient way to get a response.  Please allow 48 business hours for a response.  Please remember that this is for non-urgent requests.  _______________________________________________________ It was a pleasure to see you today!  Vito Cirigliano, D.O.

## 2023-02-16 NOTE — Progress Notes (Signed)
 Cardiology Office Note:  .   Date:  02/17/2023  ID:  Amanda Maynard, DOB 1961/01/29, MRN [id number] PCP: Watt Harlene BROCKS, MD  [facility] Providers Cardiologist:  Lamar Fitch, MD    History of Present Illness: .   Amanda Maynard is a 63 y.o. female with a past medical history of SVT, migraines, GERD, diverticulosis, anxiety, hypothyroidism, palpitations.  01/16/2023 echo EF 60 to 65%, no valvular abnormalities 11/28/2022 monitor average heart rate 71 bpm, 2 episodes of SVT 10/29/2021 calcium  score of 0, aortic atherosclerosis noted, mild aortic valve calcification  Most recently evaluated by Dr. Fitch on 12/13/2022.  She previously wore a monitor which revealed episodes of SVT.  She was doing well at this visit, staying active, and has a follow-up in 2 months.  She presents today for follow-up after recent testing as outlined above.  She is doing well from a cardiac perspective, offers no formal complaints.  She does have some ongoing GI issues, has upcoming endoscopy.  We reviewed her monitor, infrequent episodes of SVT, she also has had low hesitating heart rate and we discussed that treating her SVT may make her feel worse.  She is relieved to know that she is not having episodes of atrial fibrillation.  We reviewed how her symptoms started, typically she notices them when she sits down in the evening to watch TV, also says she might feel little anxious when it starts. She denies chest pain, dyspnea, pnd, orthopnea, n, v, dizziness, syncope, edema, weight gain, or early satiety.     ROS: Review of Systems  Cardiovascular:  Positive for palpitations.  Musculoskeletal:  Positive for joint pain.  All other systems reviewed and are negative.    Studies Reviewed: .        Cardiac Studies & Procedures      ECHOCARDIOGRAM  ECHOCARDIOGRAM COMPLETE 01/16/2023  Narrative ECHOCARDIOGRAM REPORT    Patient Name:   Amanda Maynard [facility] Date of Exam: 01/16/2023 Medical  Rec #:  [id number]        Height:       63.0 in Accession #:    [id number]       Weight:       194.8 lb Date of Birth:  30-Aug-1960       BSA:          1.912 m Patient Age:    61 years         BP:           106/64 mmHg Patient Gender: F                HR:           63 bpm. Exam Location:  [place]  Procedure: 2D Echo, Cardiac Doppler, Color Doppler and Strain Analysis  Indications:    Dyspnea on exertion [R06.09 (ICD-10-CM)]  History:        Patient has no prior history of Echocardiogram examinations. Arrythmias:SVT, Signs/Symptoms:Dyspnea; Risk Factors:Current Smoker. Palpitations.  Sonographer:    Charlie Jointer RDCS Referring Phys: [id number] LAMAR PARAS FITCH   Sonographer Comments: Image acquisition challenging due to patient body habitus. IMPRESSIONS   1. Left ventricular ejection fraction, by estimation, is 60 to 65%. The left ventricle has normal function. The left ventricle has no regional wall motion abnormalities. Left ventricular diastolic parameters were normal. The average left ventricular global longitudinal strain is 16.3 %. 2. Right ventricular systolic function is normal. The right ventricular size is normal. There is normal pulmonary artery  systolic pressure. 3. The mitral valve is normal in structure. No evidence of mitral valve regurgitation. No evidence of mitral stenosis. 4. The aortic valve is tricuspid. Aortic valve regurgitation is not visualized. No aortic stenosis is present. 5. Aortic Normal DTA. 6. The inferior vena cava is normal in size with greater than 50% respiratory variability, suggesting right atrial pressure of 3 mmHg.  FINDINGS Left Ventricle: Left ventricular ejection fraction, by estimation, is 60 to 65%. The left ventricle has normal function. The left ventricle has no regional wall motion abnormalities. The average left ventricular global longitudinal strain is 16.3 %. The left ventricular internal cavity size was normal in size. There is no  left ventricular hypertrophy. Left ventricular diastolic parameters were normal. Normal left ventricular filling pressure.  Right Ventricle: The right ventricular size is normal. No increase in right ventricular wall thickness. Right ventricular systolic function is normal. There is normal pulmonary artery systolic pressure. The tricuspid regurgitant velocity is 2.62 m/s, and with an assumed right atrial pressure of 3 mmHg, the estimated right ventricular systolic pressure is 30.5 mmHg.  Left Atrium: Left atrial size was normal in size.  Right Atrium: Right atrial size was normal in size.  Pericardium: There is no evidence of pericardial effusion.  Mitral Valve: The mitral valve is normal in structure. No evidence of mitral valve regurgitation. No evidence of mitral valve stenosis.  Tricuspid Valve: The tricuspid valve is normal in structure. Tricuspid valve regurgitation is mild . No evidence of tricuspid stenosis.  Aortic Valve: The aortic valve is tricuspid. Aortic valve regurgitation is not visualized. No aortic stenosis is present. Aortic valve mean gradient measures 6.0 mmHg. Aortic valve peak gradient measures 10.9 mmHg. Aortic valve area, by VTI measures 2.33 cm.  Pulmonic Valve: The pulmonic valve was normal in structure. Pulmonic valve regurgitation is not visualized. No evidence of pulmonic stenosis.  Aorta: Normal DTA, the aortic arch was not well visualized and the aortic root and ascending aorta are structurally normal, with no evidence of dilitation.  Venous: The pulmonary veins were not well visualized. The inferior vena cava is normal in size with greater than 50% respiratory variability, suggesting right atrial pressure of 3 mmHg.  IAS/Shunts: No atrial level shunt detected by color flow Doppler.   LEFT VENTRICLE PLAX 2D LVIDd:         4.70 cm   Diastology LVIDs:         2.80 cm   LV e' medial:    12.60 cm/s LV PW:         0.90 cm   LV E/e' medial:  6.7 LV IVS:         0.90 cm   LV e' lateral:   14.10 cm/s LVOT diam:     2.00 cm   LV E/e' lateral: 6.0 LV SV:         85 LV SV Index:   44        2D Longitudinal Strain LVOT Area:     3.14 cm  2D Strain GLS Avg:     16.3 %   RIGHT VENTRICLE             IVC RV Basal diam:  3.80 cm     IVC diam: 2.10 cm RV Mid diam:    2.80 cm RV S prime:     19.20 cm/s TAPSE (M-mode): 2.9 cm  LEFT ATRIUM             Index  RIGHT ATRIUM           Index LA diam:        3.30 cm 1.73 cm/m   RA Area:     16.80 cm LA Vol (A2C):   54.8 ml 28.66 ml/m  RA Volume:   44.20 ml  23.12 ml/m LA Vol (A4C):   54.6 ml 28.55 ml/m LA Biplane Vol: 55.9 ml 29.23 ml/m AORTIC VALVE AV Area (Vmax):    2.51 cm AV Area (Vmean):   2.31 cm AV Area (VTI):     2.33 cm AV Vmax:           165.00 cm/s AV Vmean:          111.000 cm/s AV VTI:            0.363 m AV Peak Grad:      10.9 mmHg AV Mean Grad:      6.0 mmHg LVOT Vmax:         132.00 cm/s LVOT Vmean:        81.650 cm/s LVOT VTI:          0.269 m LVOT/AV VTI ratio: 0.74  AORTA Ao Root diam: 3.00 cm Ao Asc diam:  3.10 cm Ao Desc diam: 2.10 cm  MITRAL VALVE               TRICUSPID VALVE MV Area (PHT): 3.59 cm    TR Peak grad:   27.5 mmHg MV Decel Time: 212 msec    TR Vmax:        262.00 cm/s MV E velocity: 83.95 cm/s MV A velocity: 95.60 cm/s  SHUNTS MV E/A ratio:  0.88        Systemic VTI:  0.27 m Systemic Diam: 2.00 cm  Redell Leiter MD Electronically signed by Redell Leiter MD Signature Date/Time: 01/16/2023/3:11:16 PM    Final   MONITORS  LONG TERM MONITOR (3-14 DAYS) 11/15/2022  Narrative Monitoring period: 5 days  Sinus rhythm: HR 48-125 bpm, avg 71 bpm  Two brief episodes of SVT occurred. The longer episode was 13 seconds in duration with a rate of 118 bpm and appearance most consistent with a brief atrial run. Ectopy was rare (<1%) overall. No atrial fibrillation was detected. No symptoms were reported  CT SCANS  CT CARDIAC SCORING (SELF PAY  ONLY) 10/29/2021  Addendum 10/29/2021  1:50 PM ADDENDUM REPORT: 10/29/2021 13:47  ADDENDUM: The following report is an over-read performed by radiologist Dr. Reyes Holder of [facility] on October 29, 2021. This over-read does not include interpretation of cardiac or coronary anatomy or pathology. The coronary calcium  score interpretation by the cardiologist is attached.  COMPARISON:  None.  FINDINGS: Vascular: No acute non-cardiac vascular finding. Aortic atherosclerosis.  Mediastinum/Nodes: Within the visualized portions of the thorax there are no pathologically enlarged mediastinal, or hilar lymph nodes, noting limited sensitivity for the detection of hilar adenopathy on this noncontrast study. Distal esophagus is grossly unremarkable.  Lungs/Pleura: Within the visualized portions of the thorax there are no suspicious appearing pulmonary nodules or masses, there is no acute consolidative airspace disease, no pleural effusions and no pneumothorax  Upper Abdomen: Visualized portions of the upper abdomen are unremarkable.  Musculoskeletal: There are no aggressive appearing lytic or blastic lesions noted in the visualized portions of the skeleton.  IMPRESSION: No acute incidental noncardiac finding noted.  Aortic Atherosclerosis (ICD10-I70.0).   Electronically Signed By: Reyes Holder M.D. On: 10/29/2021 13:47  Narrative CLINICAL DATA:  Cardiovascular Disease  Risk stratification  EXAM: Coronary Calcium  Score  TECHNIQUE: A gated, non-contrast computed tomography scan of the heart was performed using 3mm slice thickness. Axial images were analyzed on a dedicated workstation. Calcium  scoring of the coronary arteries was performed using the Agatston method.  FINDINGS: Coronary arteries: Normal origins.  Coronary Calcium  Score:  Total: 0  Pericardium: Normal.  Ascending Aorta: Normal caliber. Mild aortic valve calcification. Mild  annular/ascending atherosclerosis.  Non-cardiac: See separate report from [facility].  IMPRESSION: 1.  Coronary calcium  score of 0.  2. Mild aortic valve calcification. Mild annular/ascending aortic atherosclerosis.  RECOMMENDATIONS: Coronary artery calcium  (CAC) score is a strong predictor of incident coronary heart disease (CHD) and provides predictive information beyond traditional risk factors. CAC scoring is reasonable to use in the decision to withhold, postpone, or initiate statin therapy in intermediate-risk or selected borderline-risk asymptomatic adults (age 24-75 years and LDL-C >=70 to <190 mg/dL) who do not have diabetes or established atherosclerotic cardiovascular disease (ASCVD).* In intermediate-risk (10-year ASCVD risk >=7.5% to <20%) adults or selected borderline-risk (10-year ASCVD risk >=5% to <7.5%) adults in whom a CAC score is measured for the purpose of making a treatment decision the following recommendations have been made:  If CAC=0, it is reasonable to withhold statin therapy and reassess in 5 to 10 years, as long as higher risk conditions are absent (diabetes mellitus, family history of premature CHD in first degree relatives (males <55 years; females <65 years), cigarette smoking, or LDL >=190 mg/dL).  If CAC is 1 to 99, it is reasonable to initiate statin therapy for patients >=71 years of age.  If CAC is >=100 or >=75th percentile, it is reasonable to initiate statin therapy at any age.  Cardiology referral should be considered for patients with CAC scores >=400 or >=75th percentile.  *2018 AHA/ACC/AACVPR/AAPA/ABC/ACPM/ADA/AGS/APhA/ASPC/NLA/PCNA Guideline on the Management of Blood Cholesterol: A Report of the American College of Cardiology/American Heart Association Task Force on Clinical Practice Guidelines. J Am Coll Cardiol. 2019;73(24):3168-3209.  Electronically Signed: By: Oneil Parchment M.D. On: 10/29/2021 12:07           Risk Assessment/Calculations:             Physical Exam:   VS:  BP 100/74 (BP Location: Right Arm, Patient Position: Sitting, Cuff Size: Normal)   Pulse 60   Ht 5' 3 (1.6 m)   Wt 191 lb (86.6 kg)   SpO2 97%   BMI 33.83 kg/m    Wt Readings from Last 3 Encounters:  02/17/23 191 lb (86.6 kg)  02/14/23 193 lb (87.5 kg)  12/13/22 194 lb 12.8 oz (88.4 kg)    GEN: Well nourished, well developed in no acute distress NECK: No JVD; No carotid bruits CARDIAC: RRR, no murmurs, rubs, gallops RESPIRATORY:  Clear to auscultation without rales, wheezing or rhonchi  ABDOMEN: Soft, non-tender, non-distended EXTREMITIES:  No edema; No deformity   ASSESSMENT AND PLAN: .   Palpitations/SVT-recent monitor revealed 2 episodes of SVT.  Her resting heart rate today is around 60, do not think we need to start any nodal blocking agents at this time as this might make her feel worse.  Hypothyroidism-is followed by her PCP, we will see her in March for follow-up.  We discussed checking her TSH to see if this was contributing to her symptoms however she says she is relatively acutely aware if there is an issue with her thyroid  she politely declines, feels comfortable with allowing her PCP to check in March.  Dispo: Follow up as needed.   Signed, Delon JAYSON Hoover, NP

## 2023-02-17 ENCOUNTER — Encounter: Payer: Self-pay | Admitting: Cardiology

## 2023-02-17 ENCOUNTER — Ambulatory Visit: Payer: 59 | Attending: Cardiology | Admitting: Cardiology

## 2023-02-17 VITALS — BP 100/74 | HR 60 | Ht 63.0 in | Wt 191.0 lb

## 2023-02-17 DIAGNOSIS — R002 Palpitations: Secondary | ICD-10-CM

## 2023-02-17 DIAGNOSIS — I471 Supraventricular tachycardia, unspecified: Secondary | ICD-10-CM | POA: Diagnosis not present

## 2023-02-17 DIAGNOSIS — E039 Hypothyroidism, unspecified: Secondary | ICD-10-CM

## 2023-02-17 LAB — FIB-4
ALT: 14 [IU]/L (ref 0–32)
AST: 20 [IU]/L (ref 0–40)

## 2023-02-17 NOTE — Patient Instructions (Signed)
 Medication Instructions:  Your physician recommends that you continue on your current medications as directed. Please refer to the Current Medication list given to you today.  *If you need a refill on your cardiac medications before your next appointment, please call your pharmacy*   Lab Work: NONE If you have labs (blood work) drawn today and your tests are completely normal, you will receive your results only by: MyChart Message (if you have MyChart) OR A paper copy in the mail If you have any lab test that is abnormal or we need to change your treatment, we will call you to review the results.   Testing/Procedures: NONE   Follow-Up: At [facility], you and your health needs are our priority.  As part of our continuing mission to provide you with exceptional heart care, we have created designated Provider Care Teams.  These Care Teams include your primary Cardiologist (physician) and Advanced Practice Providers (APPs -  Physician Assistants and Nurse Practitioners) who all work together to provide you with the care you need, when you need it.  We recommend signing up for the patient portal called MyChart.  Sign up information is provided on this After Visit Summary.  MyChart is used to connect with patients for Virtual Visits (Telemedicine).  Patients are able to view lab/test results, encounter notes, upcoming appointments, etc.  Non-urgent messages can be sent to your provider as well.   To learn more about what you can do with MyChart, go to [email or web address].    Your next appointment:  Call or return to clinic prn if these symptoms worsen or fail to improve as anticipated.     Provider:   Lamar Fitch, MD or Delon Hoover, NP   Other Instructions

## 2023-02-19 ENCOUNTER — Encounter: Payer: Self-pay | Admitting: Gastroenterology

## 2023-02-19 ENCOUNTER — Other Ambulatory Visit: Payer: Self-pay

## 2023-02-19 ENCOUNTER — Encounter: Payer: Self-pay | Admitting: Family Medicine

## 2023-02-19 DIAGNOSIS — R748 Abnormal levels of other serum enzymes: Secondary | ICD-10-CM

## 2023-02-19 DIAGNOSIS — Z23 Encounter for immunization: Secondary | ICD-10-CM

## 2023-02-19 DIAGNOSIS — Z8619 Personal history of other infectious and parasitic diseases: Secondary | ICD-10-CM

## 2023-02-19 DIAGNOSIS — K2970 Gastritis, unspecified, without bleeding: Secondary | ICD-10-CM

## 2023-02-19 DIAGNOSIS — K7460 Unspecified cirrhosis of liver: Secondary | ICD-10-CM

## 2023-02-19 DIAGNOSIS — R9389 Abnormal findings on diagnostic imaging of other specified body structures: Secondary | ICD-10-CM

## 2023-02-19 DIAGNOSIS — G8929 Other chronic pain: Secondary | ICD-10-CM

## 2023-02-19 DIAGNOSIS — R197 Diarrhea, unspecified: Secondary | ICD-10-CM

## 2023-02-19 LAB — HEPATITIS B SURFACE ANTIBODY,QUALITATIVE: Hep B S Ab: NONREACTIVE

## 2023-02-19 LAB — ALPHA-GAL PANEL
Allergen, Mutton, f88: <0.1 kU/L
Allergen, Pork, f26: <0.1 kU/L
Beef: <0.1 kU/L
CLASS: 0
CLASS: 0
Class: 0
GALACTOSE-ALPHA-1,3-GALACTOSE IGE*: <0.1 kU/L (ref <0.10)

## 2023-02-19 LAB — MITOCHONDRIAL ANTIBODIES: Mitochondrial M2 Ab, IgG: <=20 U (ref <=20.0)

## 2023-02-19 LAB — IGG: IgG (Immunoglobin G), Serum: 1295 mg/dL (ref 600–1540)

## 2023-02-19 LAB — ANTI-SMOOTH MUSCLE ANTIBODY, IGG: Actin (Smooth Muscle) Antibody (IGG): <20 U (ref <20)

## 2023-02-19 LAB — ALPHA-1-ANTITRYPSIN: A-1 Antitrypsin, Ser: 155 mg/dL (ref 83–199)

## 2023-02-19 LAB — INTERPRETATION:

## 2023-02-19 LAB — HEPATITIS B SURFACE ANTIGEN: Hepatitis B Surface Ag: NONREACTIVE

## 2023-02-19 LAB — TISSUE TRANSGLUTAMINASE, IGA: (tTG) Ab, IgA: <1 U/mL

## 2023-02-20 ENCOUNTER — Other Ambulatory Visit (HOSPITAL_BASED_OUTPATIENT_CLINIC_OR_DEPARTMENT_OTHER): Payer: Self-pay

## 2023-02-21 ENCOUNTER — Ambulatory Visit (HOSPITAL_COMMUNITY)
Admission: RE | Admit: 2023-02-21 | Discharge: 2023-02-21 | Disposition: A | Discharge status: Home / Self Care | Payer: 59 | Source: Ambulatory Visit | Attending: Gastroenterology | Admitting: Gastroenterology

## 2023-02-21 ENCOUNTER — Encounter: Payer: Self-pay | Admitting: Gastroenterology

## 2023-02-21 ENCOUNTER — Other Ambulatory Visit (INDEPENDENT_AMBULATORY_CARE_PROVIDER_SITE_OTHER): Payer: 59

## 2023-02-21 ENCOUNTER — Ambulatory Visit (INDEPENDENT_AMBULATORY_CARE_PROVIDER_SITE_OTHER): Payer: 59 | Admitting: Gastroenterology

## 2023-02-21 VITALS — Ht 63.0 in

## 2023-02-21 DIAGNOSIS — R748 Abnormal levels of other serum enzymes: Secondary | ICD-10-CM | POA: Insufficient documentation

## 2023-02-21 DIAGNOSIS — G8929 Other chronic pain: Secondary | ICD-10-CM | POA: Diagnosis not present

## 2023-02-21 DIAGNOSIS — R7989 Other specified abnormal findings of blood chemistry: Secondary | ICD-10-CM | POA: Diagnosis not present

## 2023-02-21 DIAGNOSIS — R1013 Epigastric pain: Secondary | ICD-10-CM

## 2023-02-21 DIAGNOSIS — K2990 Gastroduodenitis, unspecified, without bleeding: Secondary | ICD-10-CM

## 2023-02-21 DIAGNOSIS — R197 Diarrhea, unspecified: Secondary | ICD-10-CM

## 2023-02-21 DIAGNOSIS — Z23 Encounter for immunization: Secondary | ICD-10-CM | POA: Diagnosis not present

## 2023-02-21 DIAGNOSIS — K2970 Gastritis, unspecified, without bleeding: Secondary | ICD-10-CM | POA: Diagnosis not present

## 2023-02-21 DIAGNOSIS — R9389 Abnormal findings on diagnostic imaging of other specified body structures: Secondary | ICD-10-CM

## 2023-02-21 DIAGNOSIS — Z944 Liver transplant status: Secondary | ICD-10-CM | POA: Diagnosis not present

## 2023-02-21 DIAGNOSIS — K709 Alcoholic liver disease, unspecified: Secondary | ICD-10-CM | POA: Diagnosis not present

## 2023-02-21 DIAGNOSIS — K7460 Unspecified cirrhosis of liver: Secondary | ICD-10-CM

## 2023-02-21 DIAGNOSIS — R932 Abnormal findings on diagnostic imaging of liver and biliary tract: Secondary | ICD-10-CM | POA: Diagnosis not present

## 2023-02-21 LAB — CBC
HCT: 39.6 % (ref 36.0–46.0)
Hemoglobin: 13.1 g/dL (ref 12.0–15.0)
MCHC: 33.1 g/dL (ref 30.0–36.0)
MCV: 86.9 fL (ref 78.0–100.0)
Platelets: 104 10*3/uL — ABNORMAL LOW (ref 150.0–400.0)
RBC: 4.56 Mil/uL (ref 3.87–5.11)
RDW: 14.8 % (ref 11.5–15.5)
WBC: 4.1 10*3/uL (ref 4.0–10.5)

## 2023-02-21 NOTE — Progress Notes (Signed)
See nursing notes for vaccine administration.

## 2023-02-22 LAB — FIB-4
ALT: 10 [IU]/L (ref 0–32)
AST: 22 [IU]/L (ref 0–40)

## 2023-02-23 NOTE — Progress Notes (Unsigned)
[facility] [address], [address] [place], [place] [address] [phone]  Date:  02/24/2023   Name:  Amanda Maynard   DOB:  17-Jun-1960   MRN:  [id number]  PCP:  Pearline Cables, MD    Chief Complaint: No chief complaint on file.   History of Present Illness:  Amanda Maynard is a 63 y.o. very pleasant female patient who presents with the following:  Patient seen today to discuss disability concerns-history of hypothyroidism, I do see status post curative treatment Most recent visit with myself was in September  She saw her cardiologist, Dr. Bing Matter in November, follow-up visit after cardiac monitor earlier this month: Palpitations/SVT-recent monitor revealed 2 episodes of SVT.  Her resting heart rate today is around 60, do not think we need to start any nodal blocking agents at this time as this might make her feel worse. Hypothyroidism-is followed by her PCP, we will see her in March for follow-up.  We discussed checking her TSH to see if this was contributing to her symptoms however she says she is relatively acutely aware if there is an issue with her thyroid she politely declines, feels comfortable with allowing her PCP to check in March.    She also saw her gastroenterologist Dr Barron Alvine earlier this month to follow-up elevated lipase and liver concerns She had a liver elastography study on the 17th to evaluate for cirrhosis, it looks like this result is still pending  She recently sent me the following message: My SSD case was denied again.  The next step is going to a court hearing with my lawyer.  I was told by the lawyer it will about 6 months before the court case will be heard. The lawyer will be sending you paperwork to fill out for my case.  I'm going to set up an appointment to come see you to talk about my pain, fatigue, every day naps, can't button or tie, etc.  I want to cover everything for this case with  you and how it medically effects me.  Patient Active Problem List   Diagnosis Date Noted   Supraventricular tachycardia (HCC) 12/13/2022   Palpitations 12/13/2022   Chondromalacia of both patellae 12/05/2022   Primary osteoarthritis of both hands 12/05/2022   Degeneration of intervertebral disc of lumbar region without discogenic back pain or lower extremity pain 12/05/2022   Arthritis of finger of left hand 05/10/2022   Bilateral hand pain 05/06/2022   Migraine 04/10/2022   Anxiety and depression 02/04/2022   Chronic lower back pain 12/05/2021   Chest pain 10/10/2021   Hepatitis C antibody test positive 12/13/2020   Hypothyroidism (acquired) 12/13/2020   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 Vista Mink. Moderate diverticulosis of the ascending colon, transverse colon, descending colon and sigmoid  colon, increase in vascularity of the rectum. External hemorrhoids.   COLONOSCOPY  08/09/2013   Dr. Vista Mink. Moderate diverticulosis of the hepatic flexure, transverse colon, splenic flexure, descending colon and sigmoid colon. Polyp (7mm) in the proximal sigmoid colon. (polypectomy). External hemorrhoids.   ESOPHAGOGASTRODUODENOSCOPY  08/09/2013   Dr. Vista Mink. Normal stomach. Normal duodenum. Grade 1 esophagitis was seen in the lower third of the esophagus.    Social History   Tobacco Use   Smoking status: Every Day    Current packs/day: 0.50    Average packs/day: 0.5 packs/day for 30.0  years (15.0 ttl pk-yrs)    Types: Cigarettes    Passive exposure: Past   Tobacco comments:    Have quit twice in 30 years but start again when stress level is too high  Vaping Use   Vaping status: Never Used  Substance Use Topics   Alcohol use: Yes    Alcohol/week: 6.0 standard drinks of alcohol    Types: 3 Cans of beer, 3 Standard drinks or equivalent per week    Comment: occ   Drug use: Never    Family History  Problem Relation Age of Onset   Breast cancer Mother 63   Heart disease Mother    Hearing loss Mother    Depression Mother    Cancer Mother    Arthritis Mother    Cancer Father    Hyperlipidemia Father    Depression Sister    Depression Sister    Heart disease Sister    Heart disease Maternal Grandmother    Cancer Maternal Grandfather    Arthritis Paternal Grandmother    Rheum arthritis Paternal Grandmother    Cancer Paternal Grandfather    Heart attack Paternal Grandfather    Esophageal cancer Neg Hx    Colon cancer Neg Hx    Stomach cancer Neg Hx    Rectal cancer Neg Hx     Allergies  Allergen Reactions   Molds & Smuts Shortness Of Breath   Sertraline     "Crazy dreams"    Sulfa Antibiotics Itching, Nausea Only, Rash and Nausea And Vomiting    Medication list has been reviewed and updated.  Current Outpatient Medications on File Prior to Visit  Medication Sig Dispense Refill   escitalopram (LEXAPRO) 10 MG tablet Take 1 tablet (10 mg total) by mouth daily. 90 tablet 0   IBUPROFEN PO Take 1 tablet by mouth as needed (Pain).     pantoprazole (PROTONIX) 40 MG tablet Take 1 tablet (40 mg total) by mouth daily. (Patient taking differently: Take 40 mg by mouth as needed (Indigestion).) 30 tablet 0   SYNTHROID 75 MCG tablet Take 1 tablet (75 mcg total) by mouth daily before breakfast. 90 tablet 1   No current facility-administered medications on file prior to visit.    Review of Systems:  As per HPI- otherwise negative.   Physical  Examination: There were no vitals filed for this visit. There were no vitals filed for this visit. There is no height or weight on file to calculate BMI. Ideal Body Weight:    GEN: no acute distress. HEENT: Atraumatic, Normocephalic.  Ears and Nose: No external deformity. CV: RRR, No M/G/R. No JVD. No thrill. No extra heart sounds. PULM: CTA B, no wheezes, crackles, rhonchi. No retractions. No resp. distress. No accessory muscle use. ABD: S, NT, ND, +BS. No rebound. No HSM. EXTR: No c/c/e PSYCH: Normally interactive. Conversant.    Assessment and Plan: ***  Signed Shanda Bumps  Brynnleigh Mcelwee, MD

## 2023-02-24 ENCOUNTER — Ambulatory Visit: Payer: 59 | Admitting: Family Medicine

## 2023-02-24 ENCOUNTER — Encounter: Payer: Self-pay | Admitting: Family Medicine

## 2023-02-24 ENCOUNTER — Other Ambulatory Visit (HOSPITAL_BASED_OUTPATIENT_CLINIC_OR_DEPARTMENT_OTHER): Payer: Self-pay

## 2023-02-24 VITALS — BP 122/82 | HR 65 | Ht 63.0 in | Wt 190.8 lb

## 2023-02-24 DIAGNOSIS — L439 Lichen planus, unspecified: Secondary | ICD-10-CM

## 2023-02-24 DIAGNOSIS — D696 Thrombocytopenia, unspecified: Secondary | ICD-10-CM | POA: Diagnosis not present

## 2023-02-24 DIAGNOSIS — M25561 Pain in right knee: Secondary | ICD-10-CM | POA: Diagnosis not present

## 2023-02-24 DIAGNOSIS — M2550 Pain in unspecified joint: Secondary | ICD-10-CM | POA: Diagnosis not present

## 2023-02-24 DIAGNOSIS — M154 Erosive (osteo)arthritis: Secondary | ICD-10-CM

## 2023-02-24 DIAGNOSIS — M25562 Pain in left knee: Secondary | ICD-10-CM

## 2023-02-24 MED ORDER — IBUPROFEN 800 MG PO TABS
800.0000 mg | ORAL_TABLET | Freq: Three times a day (TID) | ORAL | 1 refills | Status: AC | PRN
  Filled 2023-02-24: qty 30, 10d supply, fill #0

## 2023-02-24 NOTE — Patient Instructions (Addendum)
It was good to see you today- I will be in touch with your CCP results and will watch for your paperwork Ok to use the ibuprofen 800 a few times a week but NOT 3x a day- want to protect your stomach

## 2023-02-27 ENCOUNTER — Encounter: Payer: Self-pay | Admitting: Family Medicine

## 2023-02-27 LAB — CYCLIC CITRUL PEPTIDE ANTIBODY, IGG: Cyclic Citrullin Peptide Ab: <16 U

## 2023-02-28 ENCOUNTER — Encounter: Payer: Self-pay | Admitting: Gastroenterology

## 2023-03-05 NOTE — Progress Notes (Unsigned)
Office Visit Note   Patient: Amanda Maynard           Date of Birth: 1960/11/27           MRN: [id number] Visit Date: 03/06/2023              Requested by: Pearline Cables, MD [address] [place],  [place] [address] PCP: Pearline Cables, MD   Assessment & Plan: Visit Diagnoses:  1. Chronic pain of both knees     Plan: Patient is a 63 year old female with bilateral knee pain impression chondromalacia patella in the setting of polyarthralgia.  X-rays are reviewed with the patient which shows no abnormalities.  I recommend physical therapy for muscular strengthening.  Over-the-counter ointments recommended.  Follow-up as needed.  Follow-Up Instructions: No follow-ups on file.   Orders:  Orders Placed This Encounter  Procedures   Ambulatory referral to Physical Therapy   No orders of the defined types were placed in this encounter.     Procedures: No procedures performed   Clinical Data: No additional findings.   Subjective: Chief Complaint  Patient presents with   Left Knee - Pain   Right Knee - Pain    HPI Amanda Maynard is a 63 year old female who comes in for bilateral knee pain mainly anterior.  Has chronic pain.  She has polyarthralgias.  Has seen Dr. Ivar Drape in the past.  Denies any injuries in the knees.  Feels a burning sensation that is worse with using stairs. Review of Systems  Constitutional: Negative.   HENT: Negative.    Eyes: Negative.   Respiratory: Negative.    Cardiovascular: Negative.   Endocrine: Negative.   Musculoskeletal:  Positive for arthralgias.  Neurological: Negative.   Hematological: Negative.   Psychiatric/Behavioral: Negative.    All other systems reviewed and are negative.    Objective: Vital Signs: There were no vitals taken for this visit.  Physical Exam Vitals and nursing note reviewed.  Constitutional:      Appearance: She is well-developed.  HENT:     Head: Atraumatic.     Nose: Nose normal.   Eyes:     Extraocular Movements: Extraocular movements intact.  Cardiovascular:     Pulses: Normal pulses.  Pulmonary:     Effort: Pulmonary effort is normal.  Abdominal:     Palpations: Abdomen is soft.  Musculoskeletal:     Cervical back: Neck supple.  Skin:    General: Skin is warm.     Capillary Refill: Capillary refill takes less than 2 seconds.  Neurological:     Mental Status: She is alert. Mental status is at baseline.  Psychiatric:        Behavior: Behavior normal.        Thought Content: Thought content normal.        Judgment: Judgment normal.    Ortho Exam Examination of bilateral knees show 1+ patellofemoral crepitus with range of motion.  No focal findings otherwise. Specialty Comments:  No specialty comments available.  Imaging: No results found.   PMFS History: Patient Active Problem List   Diagnosis Date Noted   Supraventricular tachycardia (HCC) 12/13/2022   Palpitations 12/13/2022   Chondromalacia of both patellae 12/05/2022   Primary osteoarthritis of both hands 12/05/2022   Degeneration of intervertebral disc of lumbar region without discogenic back pain or lower extremity pain 12/05/2022   Arthritis of finger of left hand 05/10/2022   Bilateral hand pain 05/06/2022   Migraine 04/10/2022  Anxiety and depression 02/04/2022   Chronic lower back pain 12/05/2021   Chest pain 10/10/2021   Hepatitis C antibody test positive 12/13/2020   Hypothyroidism (acquired) 12/13/2020   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   Arthritis 02/05/2020   Sciatica 02/05/2020   Past Medical History:  Diagnosis Date   Allergy    Anxiety    Arthritis    Chicken pox    Depression    Diverticulitis    Emphysema of lung (HCC)    GERD (gastroesophageal reflux disease)    Headache    History of colon polyps    Jaundice    Lichen planopilaris    Thyroid disease    Urinary incontinence     Family History  Problem Relation Age of Onset   Breast cancer Mother 74    Heart disease Mother    Hearing loss Mother    Depression Mother    Cancer Mother    Arthritis Mother    Cancer Father    Hyperlipidemia Father    Depression Sister    Depression Sister    Heart disease Sister    Heart disease Maternal Grandmother    Cancer Maternal Grandfather    Arthritis Paternal Grandmother    Rheum arthritis Paternal Grandmother    Cancer Paternal Grandfather    Heart attack Paternal Grandfather    Esophageal cancer Neg Hx    Colon cancer Neg Hx    Stomach cancer Neg Hx    Rectal cancer Neg Hx     Past Surgical History:  Procedure Laterality Date   ABDOMINAL HYSTERECTOMY  2012   APPENDECTOMY  2009   COLONOSCOPY  12/02/2016   Dr. Vista Mink. Moderate diverticulosis of the ascending colon, transverse colon, descending colon and sigmoid colon, increase in vascularity of the rectum. External hemorrhoids.   COLONOSCOPY  08/09/2013   Dr. Vista Mink. Moderate diverticulosis of the hepatic flexure, transverse colon, splenic flexure, descending colon and sigmoid colon. Polyp (7mm) in the proximal sigmoid colon. (polypectomy). External hemorrhoids.   ESOPHAGOGASTRODUODENOSCOPY  08/09/2013   Dr. Vista Mink. Normal stomach. Normal duodenum. Grade 1 esophagitis was seen in the lower third of the esophagus.   Social History   Occupational History   Not on file  Tobacco Use   Smoking status: Every Day    Current packs/day: 0.50    Average packs/day: 0.5 packs/day for 30.0 years (15.0 ttl pk-yrs)    Types: Cigarettes    Passive exposure: Past   Smokeless tobacco: Not on file   Tobacco comments:    Have quit twice in 30 years but start again when stress level is too high  Vaping Use   Vaping status: Never Used  Substance and Sexual Activity   Alcohol use: Yes    Alcohol/week: 6.0 standard drinks of alcohol    Types: 3 Cans of beer, 3 Standard drinks or equivalent per week    Comment: occ   Drug use: Never   Sexual activity: Not Currently    Birth  control/protection: Abstinence, None    Comment: Had hysterectomy and no sex drive since the surgery

## 2023-03-06 ENCOUNTER — Encounter: Payer: Self-pay | Admitting: Orthopaedic Surgery

## 2023-03-06 ENCOUNTER — Ambulatory Visit: Payer: 59 | Admitting: Orthopaedic Surgery

## 2023-03-06 DIAGNOSIS — G8929 Other chronic pain: Secondary | ICD-10-CM

## 2023-03-06 DIAGNOSIS — M25561 Pain in right knee: Secondary | ICD-10-CM

## 2023-03-06 DIAGNOSIS — M25562 Pain in left knee: Secondary | ICD-10-CM | POA: Diagnosis not present

## 2023-03-11 ENCOUNTER — Telehealth: Payer: Self-pay | Admitting: Family Medicine

## 2023-03-11 NOTE — Telephone Encounter (Signed)
 Copied from CRM [id number]. Topic: General - Other >> Mar 11, 2023  2:17 PM Isabell A wrote: Reason for CRM:  Ellender from [employer] calling to confirm if a fax has been received (did not see any fax from [employer] in chart) - he would like to know what is the turn around time frame for fax.  Fax number was provided.   Callback number: [phone] ext. [phone]

## 2023-03-12 NOTE — Therapy (Signed)
 OUTPATIENT PHYSICAL THERAPY LOWER EXTREMITY EVALUATION   Patient Name: Amanda Maynard MRN: [id number] DOB:1960/04/24, 63 y.o., female Today's Date: 03/19/2023   END OF SESSION:  PT End of Session - 03/19/23 1105     Visit Number 1    Date for PT Re-Evaluation 05/14/23    Authorization Type [employer]    PT Start Time 1105    PT Stop Time 1219    PT Time Calculation (min) 74 min    Activity Tolerance Patient tolerated treatment well    Behavior During Therapy [facility] for tasks assessed/performed   Easily distracted            Past Medical History:  Diagnosis Date   Allergy    Anxiety    Arthritis    Chicken pox    Depression    Diverticulitis    Emphysema of lung (HCC)    GERD (gastroesophageal reflux disease)    Headache    History of colon polyps    Jaundice    Lichen planopilaris    Thyroid disease    Urinary incontinence    Past Surgical History:  Procedure Laterality Date   ABDOMINAL HYSTERECTOMY  2012   APPENDECTOMY  2009   COLONOSCOPY  12/02/2016   Dr. Vista Mink. Moderate diverticulosis of the ascending colon, transverse colon, descending colon and sigmoid colon, increase in vascularity of the rectum. External hemorrhoids.   COLONOSCOPY  08/09/2013   Dr. Vista Mink. Moderate diverticulosis of the hepatic flexure, transverse colon, splenic flexure, descending colon and sigmoid colon. Polyp (7mm) in the proximal sigmoid colon. (polypectomy). External hemorrhoids.   ESOPHAGOGASTRODUODENOSCOPY  08/09/2013   Dr. Vista Mink. Normal stomach. Normal duodenum. Grade 1 esophagitis was seen in the lower third of the esophagus.   Patient Active Problem List   Diagnosis Date Noted   Supraventricular tachycardia (HCC) 12/13/2022   Palpitations 12/13/2022   Chondromalacia of both patellae 12/05/2022   Primary osteoarthritis of both hands 12/05/2022   Degeneration of intervertebral disc of lumbar region without discogenic back pain or lower extremity pain 12/05/2022    Arthritis of finger of left hand 05/10/2022   Bilateral hand pain 05/06/2022   Migraine 04/10/2022   Anxiety and depression 02/04/2022   Chronic lower back pain 12/05/2021   Chest pain 10/10/2021   Hepatitis C antibody test positive 12/13/2020   Hypothyroidism (acquired) 12/13/2020   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 Knee strengthening.   THERAPY DIAG:  Chronic pain of both knees  Muscle weakness (generalized)  Other abnormalities of gait and mobility  RATIONALE FOR EVALUATION AND TREATMENT: Rehabilitation  ONSET DATE: Chronic, worsening with recent colder weather  NEXT MD VISIT:  None scheduled / PRN with referring provider;  03/19/23 with Juanda Chance, NP - [facility] for back pain   SUBJECTIVE:  SUBJECTIVE STATEMENT: Pt reports she has pain everywhere and is hoping to get on disability.  She is here for her B knee pain but reports her back pain is the worst - [facility] to [facility] someone for her back this afternoon.  Also has pain in most of her joints, esp hands.  She feels like her knee pain has been worse with the cold weather this winter.  She reports burning and popping in B knees with severe pain when climbing stairs.  She reports times where she wakes up with B LE numb, progressing to tingling/pin-and-needles.   PAIN: Are you having pain? Yes: NPRS scale: 5/10 currently, at worst 10/10  Pain location: B anterior knee/peripatellar  Pain description: grabbing, throbbing, popping, burning, soreness  and aching depending on the activity  Aggravating factors: vacuuming, climbing up/down stairs, squatting, kneeling/quadruped, prolonged sitting   Relieving factors: ice, standing still, ibuprofen, Doterrra Deep Blue   PERTINENT HISTORY:  Osteoarthritis - multiple joints, anxiety, depression, GERD, headache, thyroid disease - hypothyroidism, urinary incontinence, SVT, lumbar DDD, chronic LBP, sciatica  PRECAUTIONS: Fall  RED FLAGS: Bowel or bladder incontinence: Yes: bladder issues since her hysterectomy  WEIGHT BEARING RESTRICTIONS: No  FALLS:  Has patient fallen in last 6 months? No, 2 falls in early 2024  LIVING ENVIRONMENT: Lives with: lives with their family (lives downstairs from mother) Lives in: House/apartment Stairs: Yes: Internal: 13 steps; on right going up and External: 5 steps; on right going up Has following equipment at home: Single point cane, Walker - 2 wheeled, shower chair, and pedal bike  OCCUPATION: Out of work since Dec 2021  PLOF: Independent and Leisure: walking the dog, washing dishes, light cooking, driving for her mother's appts, relatively sedentary   PATIENT GOALS: "To be able to get up and down from a squat and be able to crawl."   OBJECTIVE: (objective measures completed at initial evaluation unless otherwise dated)  DIAGNOSTIC FINDINGS:  10/16/22 - XR Left & Right knees: No medial or lateral compartment narrowing was noted.  Moderate patellofemoral narrowing was noted.   Impression: These findings are suggestive of moderate chondromalacia  patella of the knee.   10/16/22 - XR B hips:  No hip joint narrowing was noted.  No chondrocalcinosis was noted.   Osteoarthritic changes with mild sclerosis was noted in the SI joints.   Impression: Unremarkable x-rays of the hip joints.   PATIENT SURVEYS:  LEFS 12 / 80 = 15.0 %  COGNITION: Overall cognitive status: Within functional limits for tasks assessed    SENSATION: Intermittent B LE numbness  and tingling    EDEMA:  Not normally an issue  POSTURE:  rounded shoulders, forward head, flexed trunk, but knee alignment relatively neutral  PALPATION: decreased patellar mobility L>R with poor VMO activation on quad set  MUSCLE LENGTH: Hamstrings: mod tight R>L ITB: mild/mod tight R>L Piriformis: mild/mod tight R>L Hip flexors: mild/mod tight R>L Quads: mod tight R>L Heelcord: mod tight B  LOWER EXTREMITY ROM:  Active ROM Right eval Left eval  Knee flexion 138 141  Knee extension -2 / 0* -2 / 0*  (Blank rows = not tested, * = supported knee extension)  LOWER EXTREMITY MMT:  MMT Right eval Left eval  Hip flexion 3+ 3+  Hip extension 4 4  Hip abduction 4- 4-  Hip adduction 4 4  Hip internal rotation 4- 4  Hip external rotation 4- 4-  Knee flexion 4 4  Knee extension 3+ 4-  Ankle dorsiflexion 4 4  Ankle plantarflexion 4- (7 SLS HR) 4- (6 SLS HR)  Ankle inversion    Ankle eversion     (Blank rows = not tested)  LOWER EXTREMITY SPECIAL TESTS:  Hip special tests: Ober's test: positive on R Knee special tests: Lateral pull sign: positive B and Patellafemoral grind test: positive B  FUNCTIONAL TESTS: (TBA next visit) 5 times sit to stand:   Functional gait assessment:    GAIT: Distance walked: clinic distances Assistive device utilized: Environmental consultant - 2 wheeled Level of assistance: Modified independence Gait pattern: lateral lean- Right and trunk flexed Comments: RW height adjusted 1 notch taller and cues provided for better upright posture and RW proximity   TODAY'S TREATMENT:   03/19/2023 - Eval  SELF CARE:  Reviewed eval findings and role of PT in addressing identified deficits as well as instruction to improve safety with use of RW for assistive device.   THERAPEUTIC EXERCISE: To improve strength and flexibility.  Demonstration, verbal and tactile cues throughout for technique.  Supine HS with strap x 30"   Supine ITB stretch with strap x 30" Prone hip  flexor & quad stretch with strap and towel under distal thigh x 30" Mod thomas hip flexor & quad stretch with strap x 30" Prone hip flexor & quad stretch with strap and towel under distal thigh x 30" - better stretch than previous versions Supine B quad set over towel roll 10 x 5" Hooklying B hip ADD isometric with towel roll 10 x 5"   PATIENT EDUCATION:  Education details: PT eval findings, anticipated POC, initial HEP, and gait safety with RW   Person educated: Patient Education method: Explanation, Demonstration, Verbal cues, Handouts, and MedBridgeGO app access provided Education comprehension: verbalized understanding, returned demonstration, verbal cues required, and needs further education  HOME EXERCISE PROGRAM: *Pt planning to use MedBridgeGO app  Access Code: [email or web address] URL: [email or web address] Date: 03/19/2023 Prepared by: Glenetta Hew  Exercises - Supine Hamstring Stretch with Strap  - 2 x daily - 7 x weekly - 3 reps - 30 sec hold - Supine Iliotibial Band Stretch with Strap  - 2 x daily - 7 x weekly - 3 reps - 30 sec hold - Prone Hip Flexor & Quad Stretch with Strap  - 2 x daily - 7 x weekly - 3 reps - 30 sec hold - Supine Quad Set  - 2 x daily - 7 x weekly - 2 sets - 10 reps - 5 sec hold - Supine Hip Adduction Isometric with Ball  - 2 x daily - 7 x weekly - 2 sets - 10 reps - 5 sec hold   ASSESSMENT:  CLINICAL IMPRESSION: ERCELLE WINKLES is a 63 y.o. female who was referred to physical therapy for evaluation and treatment for chronic B anterior knee pain consistent with chondromalacia patella.  Patient reports chronic pain worsening with the recent colder weather.  Pain is worse with prolonged sitting, any kneeling or quadruped activities, deep squat and climbing stairs.  B knee ROM WFL but deficits noted in B LE flexibility and B LE strength especially with quad muscle activation for VMO which are interfering with ADLs and are impacting quality of life.   On LEFS patient scored 15/80 demonstrating 85% disability.  Amanda Maynard will benefit from skilled PT to address above deficits to improve mobility and activity tolerance with decreased pain interference.  OBJECTIVE IMPAIRMENTS: Abnormal gait, decreased activity tolerance, decreased balance, decreased endurance, decreased knowledge of condition, decreased knowledge of  use of DME, decreased mobility, difficulty walking, decreased strength, hypomobility, increased fascial restrictions, impaired perceived functional ability, increased muscle spasms, impaired flexibility, impaired sensation, improper body mechanics, postural dysfunction, and pain.   ACTIVITY LIMITATIONS: carrying, lifting, bending, sitting, standing, squatting, sleeping, stairs, transfers, bed mobility, toileting, locomotion level, and caring for others  PARTICIPATION LIMITATIONS: meal prep, cleaning, laundry, driving, shopping, community activity, and walking her dog  PERSONAL FACTORS: Age, Fitness, Past/current experiences, Time since onset of injury/illness/exacerbation, and 3+ comorbidities: Osteoarthritis - multiple joints, anxiety, depression, GERD, headache, thyroid disease - hypothyroidism, urinary incontinence, SVT, lumbar DDD, chronic LBP, sciatica  are also affecting patient's functional outcome.   REHAB POTENTIAL: Good  CLINICAL DECISION MAKING: Unstable/unpredictable  EVALUATION COMPLEXITY: High   GOALS: Goals reviewed with patient? Yes  SHORT TERM GOALS: Target date:  04/16/2023  Patient will be independent with initial HEP. Baseline:  Goal status: INITIAL  2.  Patient will report at least 25% improvement in B knee pain to improve QOL. Baseline: 5/10 on eval, at worst 10/10  Goal status: INITIAL  LONG TERM GOALS: Target date: 05/14/2023  Patient will be independent with advanced/ongoing HEP to improve outcomes and carryover.  Baseline:  Goal status: INITIAL  2.  Patient will report at least 50-75% improvement in B  knee pain to improve QOL. Baseline: 5/10 currently, at worst 10/10  Goal status: INITIAL  3.  Patient will demonstrate improved B LE strength to >/= 4+/5 for improved stability and ease of mobility. Baseline: Refer to above LE MMT table Goal status: INITIAL  4.  Patient will be able to ambulate 600' with or w/o LRAD and normal gait pattern without increased pain to access community.  Baseline: Antalgic gait with flexed trunk using RW Goal status: INITIAL  5. Patient will be able to ascend/descend stairs with 1 HR and reciprocal step pattern safely to access home and community.  Baseline: NT, but pt reports severe pain with stair negotiation at home Goal status: INITIAL  6.  Patient will report ability to squat sufficient to complete normal daily tasks w/o limitation due to pain or weakness. Baseline:  Goal status: INITIAL   7.  Patient will report >/= 25/80 on LEFS (MCID = 9 pts) to demonstrate improved functional ability. Baseline: 12 / 80 = 15.0 % Goal status: INITIAL  8.  Patient will demonstrate at least 19/30 on FGA to decrease risk of falls. Baseline: TBD Goal status: INITIAL    PLAN:  PT FREQUENCY: 1-2x/week  PT DURATION: 8 weeks  PLANNED INTERVENTIONS: 97164- PT Re-evaluation, 97110-Therapeutic exercises, 97530- Therapeutic activity, [id number]- Neuromuscular re-education, 97535- Self Care, 69629- Manual therapy, [id number]- Gait training, [phone]- Aquatic Therapy, 97014- Electrical stimulation (unattended), [id number]- Electrical stimulation (manual), 97016- Vasopneumatic device, [id number]- Ultrasound, [id number]- Ionotophoresis 4mg /ml Dexamethasone, Patient/Family education, Balance training, Stair training, Taping, Dry Needling, Joint mobilization, DME instructions, Cryotherapy, and Moist heat  PLAN FOR NEXT SESSION: Review initial HEP; progress quad and proximal LE strengthening; possible trial of kinesiotaping for chondromalacia patella   Marry Guan, PT 03/19/2023, 1:19 PM

## 2023-03-12 NOTE — Telephone Encounter (Signed)
Received and placed in folder for signature.

## 2023-03-14 ENCOUNTER — Telehealth: Payer: Self-pay | Admitting: Family Medicine

## 2023-03-14 NOTE — Telephone Encounter (Signed)
 Copied from CRM [id number]. Topic: General - Other >> Mar 14, 2023  1:29 PM Deaijah H wrote: Reason for CRM: Arlin Benes called in to require a provider to fill out a medical provider source statement sent 03/09/23 @1p  / if needed, please call [phone]

## 2023-03-17 ENCOUNTER — Encounter (HOSPITAL_BASED_OUTPATIENT_CLINIC_OR_DEPARTMENT_OTHER): Payer: Self-pay

## 2023-03-17 ENCOUNTER — Encounter: Payer: Self-pay | Admitting: Family Medicine

## 2023-03-17 ENCOUNTER — Ambulatory Visit (HOSPITAL_BASED_OUTPATIENT_CLINIC_OR_DEPARTMENT_OTHER)
Admission: RE | Admit: 2023-03-17 | Discharge: 2023-03-17 | Disposition: A | Discharge status: Home / Self Care | Payer: 59 | Source: Ambulatory Visit | Attending: Family Medicine | Admitting: Family Medicine

## 2023-03-17 DIAGNOSIS — Z1231 Encounter for screening mammogram for malignant neoplasm of breast: Secondary | ICD-10-CM

## 2023-03-19 ENCOUNTER — Encounter: Payer: Self-pay | Admitting: Family Medicine

## 2023-03-19 ENCOUNTER — Ambulatory Visit: Payer: 59 | Attending: Orthopaedic Surgery | Admitting: Physical Therapy

## 2023-03-19 ENCOUNTER — Encounter: Payer: Self-pay | Admitting: Physical Medicine and Rehabilitation

## 2023-03-19 ENCOUNTER — Other Ambulatory Visit: Payer: Self-pay

## 2023-03-19 ENCOUNTER — Ambulatory Visit: Payer: 59 | Admitting: Physical Medicine and Rehabilitation

## 2023-03-19 VITALS — BP 108/68 | HR 103

## 2023-03-19 DIAGNOSIS — M25562 Pain in left knee: Secondary | ICD-10-CM | POA: Insufficient documentation

## 2023-03-19 DIAGNOSIS — M25561 Pain in right knee: Secondary | ICD-10-CM | POA: Insufficient documentation

## 2023-03-19 DIAGNOSIS — M5442 Lumbago with sciatica, left side: Secondary | ICD-10-CM

## 2023-03-19 DIAGNOSIS — R2689 Other abnormalities of gait and mobility: Secondary | ICD-10-CM | POA: Diagnosis not present

## 2023-03-19 DIAGNOSIS — M5441 Lumbago with sciatica, right side: Secondary | ICD-10-CM | POA: Diagnosis not present

## 2023-03-19 DIAGNOSIS — G894 Chronic pain syndrome: Secondary | ICD-10-CM | POA: Diagnosis not present

## 2023-03-19 DIAGNOSIS — M5416 Radiculopathy, lumbar region: Secondary | ICD-10-CM | POA: Diagnosis not present

## 2023-03-19 DIAGNOSIS — G8929 Other chronic pain: Secondary | ICD-10-CM | POA: Insufficient documentation

## 2023-03-19 DIAGNOSIS — M6281 Muscle weakness (generalized): Secondary | ICD-10-CM | POA: Diagnosis not present

## 2023-03-19 NOTE — Progress Notes (Unsigned)
Amanda Maynard - 63 y.o. female MRN [id number]  Date of birth: September 13, 1960  Office Visit Note: Visit Date: 03/19/2023 PCP: Pearline Cables, MD Referred by: Pearline Cables, MD  Subjective: Chief Complaint  Patient presents with   Lower Back - Pain   HPI: Amanda Maynard is a 63 y.o. female who comes in today as a self referral for evaluation of chronic, worsening and severe bilateral lower back pain radiating to buttocks, hips and down lateral legs to feet. Pain ongoing since 2008. Her pain is most severe when laying flat to sleep. Also reports severe pain when performing household chores such as vacuuming and mopping. Her pain seems to remain constant. States she experiences severe different types of pain, she describes her pain as sharp and stabbing sensation, states her pain radiates all the way up her neck. Also reports frequent numbness/tingling to bilateral legs. Some relief of pain with home exercise regimen, rest and use of medications. No history of dedicated formal physical therapy for her back. She recently started PT for chronic bilateral knee pain. Lumbar radiographs from 2023 show 3 mm anterolisthesis at L4-L5, there is also lower lumbar facet arthropathy. She was previously treated at pain center in [place], she has undergone multiple lumbar injections with good relief of pain. She does use walker to assist with ambulation. States she has filed for formal disability. Patient denies focal weakness. No recent trauma or falls.      Review of Systems  Musculoskeletal:  Positive for back pain and myalgias.  Neurological:  Positive for tingling. Negative for sensory change, focal weakness and weakness.  All other systems reviewed and are negative.  Otherwise per HPI.  Assessment & Plan: Visit Diagnoses:    ICD-10-CM   1. Lumbar radiculopathy  M54.16 Ambulatory referral to Physical Therapy    MR LUMBAR SPINE WO CONTRAST    2. Chronic bilateral low back pain with  bilateral sciatica  M54.42 Ambulatory referral to Physical Therapy   M54.41 MR LUMBAR SPINE WO CONTRAST   G89.29     3. Chronic pain syndrome  G89.4 Ambulatory referral to Physical Therapy    MR LUMBAR SPINE WO CONTRAST       Plan: Findings:  Chronic, worsening and severe bilateral lower back pain radiating to buttocks, hips and down lateral legs to feet, pain does radiate all the way up to her upper back/neck. Right sided pain seems to be most severe. Patient continues to have severe pain despite good conservative therapies such as home exercise regimen, rest and use of medications. Patients clinical presentation and exam are consistent with lumbar radiculopathy, more of L5 nerve pattern. I also feel there is an underlying chronic pain/central sensitization working to exacerbate her pain. Myofascial tenderness noted diffusely to bilateral thoracic and lumbar paraspinal regions upon palpation today. We discussed treatment plan in detail today, next step is to obtain lumbar MRI imaging. Depending on results of MRI imaging we discussed possibility of performing lumbar epidural steroid injection. I will also place referral for physical therapy to address chronic lower back issues. Patient has no questions at this time. No red flag symptoms noted upon exam today.     Meds & Orders: No orders of the defined types were placed in this encounter.   Orders Placed This Encounter  Procedures   MR LUMBAR SPINE WO CONTRAST   Ambulatory referral to Physical Therapy    Follow-up: Return for Lumbar MRI imaging .   Procedures: No procedures performed  Clinical History: Narrative & Impression CLINICAL DATA:  Midline and lower back pain radiates from lumbar spine to top of head for sometime now.   EXAM: LUMBAR SPINE - COMPLETE 4+ VIEW   COMPARISON:  None.   FINDINGS: 3 mm anterolisthesis of L4 versus L5. No other malalignment. No fractures. Lower lumbar facet degenerative changes.  Minimal calcified atherosclerosis in the abdominal aorta.   IMPRESSION: 1. 3 mm anterolisthesis of L4 versus L5. 2. Lower lumbar facet degenerative change. 3. Minimal calcified atherosclerosis in the abdominal aorta.     Electronically Signed   By: Gerome Sam III M.D.   On: 04/20/2021 13:44   She reports that she has been smoking cigarettes. She has a 15 pack-year smoking history. She has been exposed to tobacco smoke. She does not have any smokeless tobacco history on file.  Recent Labs    04/22/22 0956  HGBA1C 5.3    Objective:  VS:  HT:    WT:   BMI:     BP:108/68  HR:(!) 103bpm  TEMP: ( )  RESP:  Physical Exam Vitals and nursing note reviewed.  HENT:     Head: Normocephalic and atraumatic.     Right Ear: External ear normal.     Left Ear: External ear normal.     Nose: Nose normal.     Mouth/Throat:     Mouth: Mucous membranes are moist.  Eyes:     Extraocular Movements: Extraocular movements intact.  Cardiovascular:     Rate and Rhythm: Normal rate.     Pulses: Normal pulses.  Pulmonary:     Effort: Pulmonary effort is normal.  Abdominal:     General: Abdomen is flat. There is no distension.  Musculoskeletal:        General: Tenderness present.     Cervical back: Normal range of motion.     Comments: Patient rises from seated position to standing without difficulty. Good lumbar range of motion. No pain noted with facet loading. 5/5 strength noted with bilateral hip flexion, knee flexion/extension, ankle dorsiflexion/plantarflexion and EHL. No clonus noted bilaterally. No pain upon palpation of greater trochanters. No pain with internal/external rotation of bilateral hips. Sensation intact bilaterally. Dysesthesias noted to bilateral L5 dermatomes. Myofascial tenderness noted to bilateral thoracic and lumbar paraspinal regions. Negative slump test bilaterally. Ambulates with rolling walker, gait slow and unsteady.  Skin:    General: Skin is warm and dry.      Capillary Refill: Capillary refill takes less than 2 seconds.  Neurological:     Mental Status: She is alert and oriented to person, place, and time.     Gait: Gait abnormal.  Psychiatric:        Mood and Affect: Mood normal.        Behavior: Behavior normal.     Ortho Exam  Imaging: No results found.  Past Medical/Family/Surgical/Social History: Medications & Allergies reviewed per EMR, new medications updated. Patient Active Problem List   Diagnosis Date Noted   Supraventricular tachycardia (HCC) 12/13/2022   Palpitations 12/13/2022   Chondromalacia of both patellae 12/05/2022   Primary osteoarthritis of both hands 12/05/2022   Degeneration of intervertebral disc of lumbar region without discogenic back pain or lower extremity pain 12/05/2022   Arthritis of finger of left hand 05/10/2022   Bilateral hand pain 05/06/2022   Migraine 04/10/2022   Anxiety and depression 02/04/2022   Chronic lower back pain 12/05/2021   Chest pain 10/10/2021   Hepatitis C antibody test positive 12/13/2020  Hypothyroidism (acquired) 12/13/2020   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   Arthritis 02/05/2020   Sciatica 02/05/2020   Past Medical History:  Diagnosis Date   Allergy    Anxiety    Arthritis    Chicken pox    Depression    Diverticulitis    Emphysema of lung (HCC)    GERD (gastroesophageal reflux disease)    Headache    History of colon polyps    Jaundice    Lichen planopilaris    Thyroid disease    Urinary incontinence    Family History  Problem Relation Age of Onset   Breast cancer Mother 50   Heart disease Mother    Hearing loss Mother    Depression Mother    Cancer Mother    Arthritis Mother    Cancer Father    Hyperlipidemia Father    Depression Sister    Depression Sister    Heart disease Sister    Heart disease Maternal Grandmother    Cancer Maternal Grandfather    Arthritis Paternal Grandmother    Rheum arthritis Paternal Grandmother    Cancer Paternal  Grandfather    Heart attack Paternal Grandfather    Esophageal cancer Neg Hx    Colon cancer Neg Hx    Stomach cancer Neg Hx    Rectal cancer Neg Hx    Past Surgical History:  Procedure Laterality Date   ABDOMINAL HYSTERECTOMY  2012   APPENDECTOMY  2009   COLONOSCOPY  12/02/2016   Dr. Vista Mink. Moderate diverticulosis of the ascending colon, transverse colon, descending colon and sigmoid colon, increase in vascularity of the rectum. External hemorrhoids.   COLONOSCOPY  08/09/2013   Dr. Vista Mink. Moderate diverticulosis of the hepatic flexure, transverse colon, splenic flexure, descending colon and sigmoid colon. Polyp (7mm) in the proximal sigmoid colon. (polypectomy). External hemorrhoids.   ESOPHAGOGASTRODUODENOSCOPY  08/09/2013   Dr. Vista Mink. Normal stomach. Normal duodenum. Grade 1 esophagitis was seen in the lower third of the esophagus.   Social History   Occupational History   Not on file  Tobacco Use   Smoking status: Every Day    Current packs/day: 0.50    Average packs/day: 0.5 packs/day for 30.0 years (15.0 ttl pk-yrs)    Types: Cigarettes    Passive exposure: Past   Smokeless tobacco: Not on file   Tobacco comments:    Have quit twice in 30 years but start again when stress level is too high  Vaping Use   Vaping status: Never Used  Substance and Sexual Activity   Alcohol use: Yes    Alcohol/week: 6.0 standard drinks of alcohol    Types: 3 Cans of beer, 3 Standard drinks or equivalent per week    Comment: occ   Drug use: Never   Sexual activity: Not Currently    Birth control/protection: Abstinence, None    Comment: Had hysterectomy and no sex drive since the surgery

## 2023-03-19 NOTE — Progress Notes (Unsigned)
Pain Scale---7 No Allergies to Contrast DYE No Blood thinners

## 2023-03-20 ENCOUNTER — Telehealth: Payer: Self-pay | Admitting: Family Medicine

## 2023-03-20 NOTE — Telephone Encounter (Signed)
Form faxed to medical records

## 2023-03-20 NOTE — Telephone Encounter (Signed)
Copied from CRM [id number]. Topic: General - Other >> Mar 20, 2023 11:20 AM Corin V wrote: Reason for CRM: [facility] is faxing forms over to the office for the patient. Please let them know when these are received.

## 2023-03-20 NOTE — Telephone Encounter (Signed)
Dr Patsy Lager has her forms and is handling this.

## 2023-03-21 ENCOUNTER — Telehealth: Payer: Self-pay | Admitting: Family Medicine

## 2023-03-21 ENCOUNTER — Other Ambulatory Visit: Payer: Self-pay | Admitting: Family Medicine

## 2023-03-21 ENCOUNTER — Other Ambulatory Visit (HOSPITAL_BASED_OUTPATIENT_CLINIC_OR_DEPARTMENT_OTHER): Payer: Self-pay

## 2023-03-21 DIAGNOSIS — F32A Depression, unspecified: Secondary | ICD-10-CM

## 2023-03-21 MED ORDER — ESCITALOPRAM OXALATE 10 MG PO TABS
10.0000 mg | ORAL_TABLET | Freq: Every day | ORAL | 0 refills | Status: DC
  Filled 2023-03-21: qty 30, 30d supply, fill #0
  Filled 2023-04-22: qty 30, 30d supply, fill #1

## 2023-03-21 NOTE — Telephone Encounter (Signed)
Copied from CRM [id number]. Topic: General - Other >> Mar 20, 2023 11:20 AM Corin V wrote: Reason for CRM: [facility] is faxing forms over to the office for the patient. Please let them know when these are received. >> Mar 21, 2023 10:04 AM Claudine Mouton wrote: Mariana Kaufman with [employer] called to advise that he sent over a Medical Provider Source Information to be completed by dr. Patsy Lager [facility] for the patient yesterday. Fax number confirmed. This form needs to be filled, dated and signed by Monday. Please assist.   Phone: [phone] ext [phone] Fax: [phone]

## 2023-03-24 ENCOUNTER — Encounter: Payer: Self-pay | Admitting: Physical Therapy

## 2023-03-24 ENCOUNTER — Ambulatory Visit: Payer: 59 | Admitting: Physical Therapy

## 2023-03-24 ENCOUNTER — Telehealth: Payer: Self-pay | Admitting: Family Medicine

## 2023-03-24 DIAGNOSIS — M5416 Radiculopathy, lumbar region: Secondary | ICD-10-CM | POA: Diagnosis not present

## 2023-03-24 DIAGNOSIS — M5441 Lumbago with sciatica, right side: Secondary | ICD-10-CM | POA: Diagnosis not present

## 2023-03-24 DIAGNOSIS — M5442 Lumbago with sciatica, left side: Secondary | ICD-10-CM | POA: Diagnosis not present

## 2023-03-24 DIAGNOSIS — M25562 Pain in left knee: Secondary | ICD-10-CM | POA: Diagnosis not present

## 2023-03-24 DIAGNOSIS — R2689 Other abnormalities of gait and mobility: Secondary | ICD-10-CM

## 2023-03-24 DIAGNOSIS — M6281 Muscle weakness (generalized): Secondary | ICD-10-CM | POA: Diagnosis not present

## 2023-03-24 DIAGNOSIS — G8929 Other chronic pain: Secondary | ICD-10-CM

## 2023-03-24 DIAGNOSIS — G894 Chronic pain syndrome: Secondary | ICD-10-CM | POA: Diagnosis not present

## 2023-03-24 DIAGNOSIS — M25561 Pain in right knee: Secondary | ICD-10-CM | POA: Diagnosis not present

## 2023-03-24 NOTE — Telephone Encounter (Signed)
 Copied from CRM [id number]. Topic: General - Other >> Mar 24, 2023 10:10 AM Denese Killings wrote: Reason for CRM: Mariana Kaufman with [employer] wants to know the turnaround time when Dr. Patsy Lager will be done with the Medical Provider Source statement form.

## 2023-03-24 NOTE — Therapy (Signed)
 OUTPATIENT PHYSICAL THERAPY TREATMENT   Patient Name: Amanda Maynard MRN: [id number] DOB:[birth year], 63 y.o., female Today's Date: 03/24/2023   END OF SESSION:  PT End of Session - 03/24/23 1021     Visit Number 2    Date for PT Re-Evaluation 05/14/23    Authorization Type [employer]    PT Start Time 1021   Pt arrived late   PT Stop Time 1105    PT Time Calculation (min) 44 min    Activity Tolerance Patient tolerated treatment well    Behavior During Therapy [facility] for tasks assessed/performed              Past Medical History:  Diagnosis Date   Allergy    Anxiety    Arthritis    Chicken pox    Depression    Diverticulitis    Emphysema of lung (HCC)    GERD (gastroesophageal reflux disease)    Headache    History of colon polyps    Jaundice    Lichen planopilaris    Thyroid disease    Urinary incontinence    Past Surgical History:  Procedure Laterality Date   ABDOMINAL HYSTERECTOMY  2012   APPENDECTOMY  2009   COLONOSCOPY  12/02/2016   Dr. Vista Mink. Moderate diverticulosis of the ascending colon, transverse colon, descending colon and sigmoid colon, increase in vascularity of the rectum. External hemorrhoids.   COLONOSCOPY  08/09/2013   Dr. Vista Mink. Moderate diverticulosis of the hepatic flexure, transverse colon, splenic flexure, descending colon and sigmoid colon. Polyp (7mm) in the proximal sigmoid colon. (polypectomy). External hemorrhoids.   ESOPHAGOGASTRODUODENOSCOPY  08/09/2013   Dr. Vista Mink. Normal stomach. Normal duodenum. Grade 1 esophagitis was seen in the lower third of the esophagus.   Patient Active Problem List   Diagnosis Date Noted   Supraventricular tachycardia (HCC) 12/13/2022   Palpitations 12/13/2022   Chondromalacia of both patellae 12/05/2022   Primary osteoarthritis of both hands 12/05/2022   Degeneration of intervertebral disc of lumbar region without discogenic back pain or lower extremity pain 12/05/2022   Arthritis of  finger of left hand 05/10/2022   Bilateral hand pain 05/06/2022   Migraine 04/10/2022   Anxiety and depression 02/04/2022   Chronic lower back pain 12/05/2021   Chest pain 10/10/2021   Hepatitis C antibody test positive 12/13/2020   Hypothyroidism (acquired) 12/13/2020   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 Knee strengthening.   THERAPY DIAG:  Chronic pain of both knees  Muscle weakness (generalized)  Other abnormalities of gait and mobility  RATIONALE FOR EVALUATION AND TREATMENT: Rehabilitation  ONSET DATE: Chronic, worsening with recent colder weather  NEXT MD VISIT:  None scheduled / PRN with referring provider;  03/19/23 with Juanda Chance, NP - [facility] for back pain   SUBJECTIVE:  SUBJECTIVE STATEMENT: New PT referral received from Ellin Goodie, NP to eval treat: Lumbar radiculopathy, myofascial pain syndrome. Consider manual treatments and dry needling.  As we are just getting started with PT for her knees, we decided to defer her back eval to see how she responds to the PT for her knees as she already notes benefit from the initial HEP.  EVAL: Pt reports she has pain everywhere and is hoping to get on disability.  She is here for her B knee pain but reports her back pain is the worst - has an appt [facility] to see someone for her back this afternoon.  Also has pain in most of her joints, esp hands.  She feels like her knee pain has been worse with the cold weather this winter.  She reports burning and popping  in B knees with severe pain when climbing stairs.  She reports times where she wakes up with B LE numb, progressing to tingling/pin-and-needles.   PAIN: Are you having pain? Yes: NPRS scale: 4/10   Pain location: B anterior knee/peripatellar  Pain description: grabbing, throbbing, popping, burning, soreness and aching depending on the activity  Aggravating factors: vacuuming, climbing up/down stairs, squatting, kneeling/quadruped, prolonged sitting   Relieving factors: ice, standing still, ibuprofen, Doterrra Deep Blue   Are you having pain? Yes: NPRS scale: 7/10  Pain location: midline low back & sacrum   PERTINENT HISTORY:  Osteoarthritis - multiple joints, anxiety, depression, GERD, headache, thyroid disease - hypothyroidism, urinary incontinence, SVT, lumbar DDD, chronic LBP, sciatica  PRECAUTIONS: Fall  RED FLAGS: Bowel or bladder incontinence: Yes: bladder issues since her hysterectomy  WEIGHT BEARING RESTRICTIONS: No  FALLS:  Has patient fallen in last 6 months? No, 2 falls in early 2024  LIVING ENVIRONMENT: Lives with: lives with their family (lives downstairs from mother) Lives in: House/apartment Stairs: Yes: Internal: 13 steps; on right going up and External: 5 steps; on right going up Has following equipment at home: Single point cane, Walker - 2 wheeled, shower chair, and pedal bike  OCCUPATION: Out of work since Dec 2021  PLOF: Independent and Leisure: walking the dog, washing dishes, light cooking, driving for her mother's appts, relatively sedentary   PATIENT GOALS: "To be able to get up and down from a squat and be able to crawl."   OBJECTIVE: (objective measures completed at initial evaluation unless otherwise dated)  DIAGNOSTIC FINDINGS:  10/16/22 - XR Left & Right knees: No medial or lateral compartment narrowing was noted.  Moderate patellofemoral narrowing was noted.   Impression: These findings are suggestive of moderate chondromalacia  patella of the  knee.   10/16/22 - XR B hips:  No hip joint narrowing was noted.  No chondrocalcinosis was noted.   Osteoarthritic changes with mild sclerosis was noted in the SI joints.   Impression: Unremarkable x-rays of the hip joints.   PATIENT SURVEYS:  LEFS 12 / 80 = 15.0 %  COGNITION: Overall cognitive status: Within functional limits for tasks assessed    SENSATION: Intermittent B LE numbness and tingling    EDEMA:  Not normally an issue  POSTURE:  rounded shoulders, forward head, flexed trunk, but knee alignment relatively neutral  PALPATION: decreased patellar mobility L>R with poor VMO activation on quad set  MUSCLE LENGTH: Hamstrings: mod tight R>L ITB: mild/mod tight R>L Piriformis: mild/mod tight R>L Hip flexors: mild/mod tight R>L Quads: mod tight R>L Heelcord: mod tight B  LOWER EXTREMITY ROM:  Active ROM Right eval Left  eval  Knee flexion 138 141  Knee extension -2 / 0* -2 / 0*  (Blank rows = not tested, * = supported knee extension)  LOWER EXTREMITY MMT:  MMT Right eval Left eval  Hip flexion 3+ 3+  Hip extension 4 4  Hip abduction 4- 4-  Hip adduction 4 4  Hip internal rotation 4- 4  Hip external rotation 4- 4-  Knee flexion 4 4  Knee extension 3+ 4-  Ankle dorsiflexion 4 4  Ankle plantarflexion 4- (7 SLS HR) 4- (6 SLS HR)  Ankle inversion    Ankle eversion     (Blank rows = not tested)  LOWER EXTREMITY SPECIAL TESTS:  Hip special tests: Ober's test: positive on R Knee special tests: Lateral pull sign: positive B and Patellafemoral grind test: positive B  FUNCTIONAL TESTS: (03/24/23) 5 times sit to stand: 20.03 sec; >15 sec indicates recurrent fall risk 10 meter walk test: 10.46 sec; Gait speed = 3.14 ft/sec, >2.62 ft/sec indicates community ambulator  Functional gait assessment: 19/30; 19-24 = medium risk fall   GAIT: Distance walked: clinic distances Assistive device utilized: Environmental consultant - 2 wheeled Level of assistance: Modified independence Gait  pattern: lateral lean- Right and trunk flexed Comments: RW height adjusted 1 notch taller and cues provided for better upright posture and RW proximity   TODAY'S TREATMENT:   03/24/2023 PHYSICAL PERFORMANCE TEST or MEASUREMENT: 5xSTS = 20.03 sec; >15 sec indicates recurrent fall risk = 10.46 sec w/o AD Gait speed = 3.14 ft/sec; >2.62 ft/sec indicates community ambulator FGA = 19/30; 19-24 = medium risk fall  Functional Gait  Assessment  Gait assessed  Yes   Gait Level Surface Walks 20 ft in less than 7 sec but greater than 5.5 sec, uses assistive device, slower speed, mild gait deviations, or deviates 6-10 in outside of the 12 in walkway width.   Change in Gait Speed Able to smoothly change walking speed without loss of balance or gait deviation. Deviate no more than 6 in outside of the 12 in walkway width.   Gait with Horizontal Head Turns Performs head turns smoothly with slight change in gait velocity (eg, minor disruption to smooth gait path), deviates 6-10 in outside 12 in walkway width, or uses an assistive device.   Gait with Vertical Head Turns Performs task with slight change in gait velocity (eg, minor disruption to smooth gait path), deviates 6 - 10 in outside 12 in walkway width or uses assistive device   Gait and Pivot Turn Pivot turns safely in greater than 3 sec and stops with no loss of balance, or pivot turns safely within 3 sec and stops with mild imbalance, requires small steps to catch balance.   Step Over Obstacle Is able to step over one shoe box (4.5 in total height) without changing gait speed. No evidence of imbalance.   Gait with Narrow Base of Support Ambulates 7-9 steps.   Gait with Eyes Closed Walks 20 ft, slow speed, abnormal gait pattern, evidence for imbalance, deviates 10-15 in outside 12 in walkway width. Requires more than 9 sec to ambulate 20 ft.   Ambulating Backwards Walks 20 ft, slow speed, abnormal gait pattern, evidence for imbalance, deviates 10-15  in outside 12 in walkway width.   Steps Alternating feet, must use rail.   Total Score 19   FGA comment: 19-24 = medium risk fall      THERAPEUTIC EXERCISE: To improve strength, endurance, and flexibility.  Demonstration, verbal and tactile cues throughout  for technique.  Supine HS with strap x 30" bil Supine ITB stretch with strap x 30" bil Prone hip flexor & quad stretch with strap and towel under distal thigh x 30" bil Supine B quad set over towel roll 10 x 5" Hooklying B hip ADD isometric ball squeeze 10 x 5" - cues for TrA isometric Hoolkying TrA + SLR 5 x 3" bil Hoolkying TrA + SLR + hip ER 5 x 3" bil   03/19/2023 - Eval  SELF CARE:  Reviewed eval findings and role of PT in addressing identified deficits as well as instruction to improve safety with use of RW for assistive device.   THERAPEUTIC EXERCISE: To improve strength and flexibility.  Demonstration, verbal and tactile cues throughout for technique.  Rec Bike - L1 x 6 min Supine HS with strap x 30"   Supine ITB stretch with strap x 30" Prone hip flexor & quad stretch with strap x 30" Mod thomas hip flexor & quad stretch with strap x 30" Prone hip flexor & quad stretch with strap and towel under distal thigh x 30" - better stretch than previous versions Supine B quad set over towel roll 10 x 5" Hooklying B hip ADD isometric with towel roll 10 x 5", 2 sets    PATIENT EDUCATION:  Education details: PT eval findings, HEP review, and HEP progression  Person educated: Patient Education method: Explanation, Demonstration, Verbal cues, Handouts, and MedBridgeGO app updated  Education comprehension: verbalized understanding, returned demonstration, verbal cues required, and needs further education  HOME EXERCISE PROGRAM: *Pt using MedBridgeGO app  Access Code: [email or web address] URL: [email or web address] Date: 03/24/2023 Prepared by: Glenetta Hew  Exercises - Supine Hamstring Stretch with Strap  - 2 x daily - 7 x  weekly - 3 reps - 30 sec hold - Supine Iliotibial Band Stretch with Strap  - 2 x daily - 7 x weekly - 3 reps - 30 sec hold - Prone Hip Flexor & Quad Stretch with Strap  - 2 x daily - 7 x weekly - 3 reps - 30 sec hold - Supine Quad Set  - 2 x daily - 7 x weekly - 2 sets - 10 reps - 5 sec hold - Supine Hip Adduction Isometric with Ball  - 2 x daily - 7 x weekly - 2 sets - 10 reps - 5 sec hold - Active Straight Leg Raise with Quad Set  - 2 x daily - 7 x weekly - 2 sets - 10 reps - 3-5 sec hold - Straight Leg Raise with External Rotation  - 2 x daily - 7 x weekly - 1-2 sets - 10 reps - 3 sec hold   ASSESSMENT:  CLINICAL IMPRESSION: Standardized balance assessment completed with 5xSTS time of 20.03 sec indicating a recurrent fall risk and FGA score of 19/30 indicating a medium fall risk.  Gait speed of 3.14 ft/sec indicates a community ambulator.  HEP reviewed with minor clarifications provided for appropriate hold times and number of reps.  Amanda Maynard reporting benefit from initial HEP with already some improvement noted in her knee pain.  Initiated strengthening progression but limited by time constraints, therefore we will plan to progress this in upcoming visits.  Amanda Maynard will benefit from continued skilled PT to address ongoing knee deficits to improve mobility and activity tolerance with decreased pain interference.  Will plan to proceed with eval and treat for her low back pain pending response to initial PT for her knees.  OBJECTIVE IMPAIRMENTS: Abnormal gait, decreased  activity tolerance, decreased balance, decreased endurance, decreased knowledge of condition, decreased knowledge of use of DME, decreased mobility, difficulty walking, decreased strength, hypomobility, increased fascial restrictions, impaired perceived functional ability, increased muscle spasms, impaired flexibility, impaired sensation, improper body mechanics, postural dysfunction, and pain.   ACTIVITY LIMITATIONS: carrying, lifting,  bending, sitting, standing, squatting, sleeping, stairs, transfers, bed mobility, toileting, locomotion level, and caring for others  PARTICIPATION LIMITATIONS: meal prep, cleaning, laundry, driving, shopping, community activity, and walking her dog  PERSONAL FACTORS: Age, Fitness, Past/current experiences, Time since onset of injury/illness/exacerbation, and 3+ comorbidities: Osteoarthritis - multiple joints, anxiety, depression, GERD, headache, thyroid disease - hypothyroidism, urinary incontinence, SVT, lumbar DDD, chronic LBP, sciatica  are also affecting patient's functional outcome.   REHAB POTENTIAL: Good  CLINICAL DECISION MAKING: Unstable/unpredictable  EVALUATION COMPLEXITY: High   GOALS: Goals reviewed with patient? Yes  SHORT TERM GOALS: Target date:  04/16/2023  Patient will be independent with initial HEP. Baseline:  Goal status: IN PROGRESS  2.  Patient will report at least 25% improvement in B knee pain to improve QOL. Baseline: 5/10 on eval, at worst 10/10  Goal status: IN PROGRESS  LONG TERM GOALS: Target date: 05/14/2023  Patient will be independent with advanced/ongoing HEP to improve outcomes and carryover.  Baseline:  Goal status: IN PROGRESS  2.  Patient will report at least 50-75% improvement in B knee pain to improve QOL. Baseline: 5/10 currently, at worst 10/10  Goal status: IN PROGRESS  3.  Patient will demonstrate improved B LE strength to >/= 4+/5 for improved stability and ease of mobility. Baseline: Refer to above LE MMT table Goal status: IN PROGRESS  4.  Patient will be able to ambulate 600' with or w/o LRAD and normal gait pattern without increased pain to access community.  Baseline: Antalgic gait with flexed trunk using RW Goal status: IN PROGRESS  5. Patient will be able to ascend/descend stairs with 1 HR and reciprocal step pattern safely to access home and community.  Baseline: NT, but pt reports severe pain with stair negotiation at  home Goal status: IN PROGRESS  6.  Patient will report ability to squat sufficient to complete normal daily tasks w/o limitation due to pain or weakness. Baseline:  Goal status: IN PROGRESS   7.  Patient will report >/= 25/80 on LEFS (MCID = 9 pts) to demonstrate improved functional ability. Baseline: 12 / 80 = 15.0 % Goal status: IN PROGRESS  8.  Patient will demonstrate at least 19/30 on FGA to decrease risk of falls. Baseline: TBD Goal status: IN PROGRESS    PLAN:  PT FREQUENCY: 1-2x/week  PT DURATION: 8 weeks  PLANNED INTERVENTIONS: 97164- PT Re-evaluation, 97110-Therapeutic exercises, 97530- Therapeutic activity, 97112- Neuromuscular re-education, 97535- Self Care, 09811- Manual therapy, [phone]- Gait training, [phone]- Aquatic Therapy, [phone]- Electrical stimulation (unattended), [phone]- Electrical stimulation (manual), 97016- Vasopneumatic device, [id number]- Ultrasound, [id number]- Ionotophoresis 4mg /ml Dexamethasone, Patient/Family education, Balance training, Stair training, Taping, Dry Needling, Joint mobilization, DME instructions, Cryotherapy, Moist heat, and 97750- Physical Performance Test   PLAN FOR NEXT SESSION: progress quad and proximal LE strengthening - review/update HEP accordingly; possible trial of kinesiotaping for chondromalacia patella; new eval for low back pain when knee pain stabilized/resolved   Marry Guan, PT 03/24/2023, 11:06 AM

## 2023-03-25 ENCOUNTER — Ambulatory Visit (INDEPENDENT_AMBULATORY_CARE_PROVIDER_SITE_OTHER): Payer: 59 | Admitting: Gastroenterology

## 2023-03-25 ENCOUNTER — Encounter: Payer: Self-pay | Admitting: Gastroenterology

## 2023-03-25 DIAGNOSIS — K7460 Unspecified cirrhosis of liver: Secondary | ICD-10-CM

## 2023-03-25 DIAGNOSIS — Z23 Encounter for immunization: Secondary | ICD-10-CM

## 2023-03-26 NOTE — Progress Notes (Signed)
See nursing notes for vaccine administration.

## 2023-03-27 ENCOUNTER — Telehealth: Payer: Self-pay

## 2023-03-27 NOTE — Telephone Encounter (Signed)
 Called [employer] and left a VM asking if they have received the completed paperwork for pt that has been faxed over.

## 2023-03-27 NOTE — Telephone Encounter (Signed)
 Called [employer] back again with no answer.

## 2023-03-28 NOTE — Telephone Encounter (Signed)
 Okay to inform Amanda Maynard forms have been faxed to [employer].

## 2023-04-02 ENCOUNTER — Encounter: Payer: Self-pay | Admitting: Gastroenterology

## 2023-04-02 ENCOUNTER — Ambulatory Visit (AMBULATORY_SURGERY_CENTER): Payer: 59 | Admitting: Gastroenterology

## 2023-04-02 VITALS — BP 112/76 | HR 66 | Temp 98.1°F | Resp 12 | Ht 63.0 in | Wt 193.0 lb

## 2023-04-02 DIAGNOSIS — K2970 Gastritis, unspecified, without bleeding: Secondary | ICD-10-CM | POA: Diagnosis not present

## 2023-04-02 DIAGNOSIS — F32A Depression, unspecified: Secondary | ICD-10-CM | POA: Diagnosis not present

## 2023-04-02 DIAGNOSIS — R748 Abnormal levels of other serum enzymes: Secondary | ICD-10-CM

## 2023-04-02 DIAGNOSIS — G8929 Other chronic pain: Secondary | ICD-10-CM

## 2023-04-02 DIAGNOSIS — R1013 Epigastric pain: Secondary | ICD-10-CM

## 2023-04-02 DIAGNOSIS — K219 Gastro-esophageal reflux disease without esophagitis: Secondary | ICD-10-CM | POA: Diagnosis not present

## 2023-04-02 DIAGNOSIS — K3189 Other diseases of stomach and duodenum: Secondary | ICD-10-CM

## 2023-04-02 DIAGNOSIS — K319 Disease of stomach and duodenum, unspecified: Secondary | ICD-10-CM | POA: Diagnosis not present

## 2023-04-02 DIAGNOSIS — F419 Anxiety disorder, unspecified: Secondary | ICD-10-CM | POA: Diagnosis not present

## 2023-04-02 HISTORY — PX: UPPER GI ENDOSCOPY: SHX6162

## 2023-04-02 MED ORDER — SODIUM CHLORIDE 0.9 % IV SOLN: 500.0000 mL | Freq: Once | INTRAVENOUS | Status: DC

## 2023-04-02 NOTE — Progress Notes (Signed)
 Called to room to assist during endoscopic procedure.  Patient ID and intended procedure confirmed with present staff. Received instructions for my participation in the procedure from the performing physician.

## 2023-04-02 NOTE — Progress Notes (Signed)
 GASTROENTEROLOGY PROCEDURE H&P NOTE   Primary Care Physician: Pearline Cables, MD    Reason for Procedure:   GERD, elevated lipase, epigastric pain, hx of gastritis, EV screening  Plan:    EGD  Patient is appropriate for endoscopic procedure(s) in the ambulatory ([facility]) setting.  The nature of the procedure, as well as the risks, benefits, and alternatives were carefully and thoroughly reviewed with the patient. Ample time for discussion and questions allowed. The patient understood, was satisfied, and agreed to proceed.     HPI: Amanda Maynard is a 63 y.o. female who presents for EGD for evaluation of GERD, epigastric pain, hx of gastritis with antral ulcers, elevated lipase, and EV screening dur to cirrhotic appearing liver on CT and [place] along with thrombocytopenia.   Past Medical History:  Diagnosis Date   Allergy    Anxiety    Arthritis    Chicken pox    Depression    Diverticulitis    Emphysema of lung (HCC)    GERD (gastroesophageal reflux disease)    Headache    History of colon polyps    Jaundice    Lichen planopilaris    Thyroid disease    Urinary incontinence     Past Surgical History:  Procedure Laterality Date   ABDOMINAL HYSTERECTOMY  2012   APPENDECTOMY  2009   COLONOSCOPY  12/02/2016   Dr. Vista Mink. Moderate diverticulosis of the ascending colon, transverse colon, descending colon and sigmoid colon, increase in vascularity of the rectum. External hemorrhoids.   COLONOSCOPY  08/09/2013   Dr. Vista Mink. Moderate diverticulosis of the hepatic flexure, transverse colon, splenic flexure, descending colon and sigmoid colon. Polyp (7mm) in the proximal sigmoid colon. (polypectomy). External hemorrhoids.   ESOPHAGOGASTRODUODENOSCOPY  08/09/2013   Dr. Vista Mink. Normal stomach. Normal duodenum. Grade 1 esophagitis was seen in the lower third of the esophagus.    Prior to Admission medications   Medication Sig Start Date End Date Taking? Authorizing  Provider  escitalopram (LEXAPRO) 10 MG tablet Take 1 tablet (10 mg total) by mouth daily. 03/21/23  Yes [name], Gwenlyn Found, MD  SYNTHROID 75 MCG tablet Take 1 tablet (75 mcg total) by mouth daily before breakfast. 11/19/22  Yes [name], Gwenlyn Found, MD  ibuprofen (ADVIL) 800 MG tablet Take 1 tablet (800 mg total) by mouth every 8 (eight) hours as needed. Patient not taking: Reported on 04/02/2023 02/24/23   [name], Gwenlyn Found, MD  IBUPROFEN PO Take 1 tablet by mouth as needed (Pain).    [provider]  pantoprazole (PROTONIX) 40 MG tablet Take 1 tablet (40 mg total) by mouth daily. Patient taking differently: Take 40 mg by mouth as needed (Indigestion). 03/11/22 02/17/23  Chairty Toman, Verlin Dike, DO    Current Outpatient Medications  Medication Sig Dispense Refill   escitalopram (LEXAPRO) 10 MG tablet Take 1 tablet (10 mg total) by mouth daily. 90 tablet 0   SYNTHROID 75 MCG tablet Take 1 tablet (75 mcg total) by mouth daily before breakfast. 90 tablet 1   ibuprofen (ADVIL) 800 MG tablet Take 1 tablet (800 mg total) by mouth every 8 (eight) hours as needed. (Patient not taking: Reported on 04/02/2023) 30 tablet 1   IBUPROFEN PO Take 1 tablet by mouth as needed (Pain).     pantoprazole (PROTONIX) 40 MG tablet Take 1 tablet (40 mg total) by mouth daily. (Patient taking differently: Take 40 mg by mouth as needed (Indigestion).) 30 tablet 0   Current Facility-Administered Medications  Medication Dose Route Frequency Provider Last Rate Last Admin   0.9 %  sodium chloride infusion  500 mL Intravenous Once Trip Cavanagh V, DO        Allergies as of 04/02/2023 - Review Complete 04/02/2023  Allergen Reaction Noted   Molds & smuts Shortness Of Breath 12/13/2020   Sertraline Other (See Comments) 12/13/2020   Sulfa antibiotics Itching, Nausea Only, Rash, and Nausea And Vomiting 12/13/2020    Family History  Problem Relation Age of Onset   Breast cancer Mother 56   Heart disease Mother    Hearing  loss Mother    Depression Mother    Cancer Mother    Arthritis Mother    Cancer Father    Hyperlipidemia Father    Depression Sister    Depression Sister    Heart disease Sister    Heart disease Maternal Grandmother    Cancer Maternal Grandfather    Arthritis Paternal Grandmother    Rheum arthritis Paternal Grandmother    Cancer Paternal Grandfather    Heart attack Paternal Grandfather    Esophageal cancer Neg Hx    Colon cancer Neg Hx    Stomach cancer Neg Hx    Rectal cancer Neg Hx     Social History   Socioeconomic History   Marital status: Divorced    Spouse name: Not on file   Number of children: Not on file   Years of education: Not on file   Highest education level: Some college, no degree  Occupational History   Not on file  Tobacco Use   Smoking status: Every Day    Current packs/day: 0.50    Average packs/day: 0.5 packs/day for 30.0 years (15.0 ttl pk-yrs)    Types: Cigarettes    Passive exposure: Past   Smokeless tobacco: Not on file   Tobacco comments:    Have quit twice in 30 years but start again when stress level is too high  Vaping Use   Vaping status: Never Used  Substance and Sexual Activity   Alcohol use: Yes    Alcohol/week: 6.0 standard drinks of alcohol    Types: 3 Cans of beer, 3 Standard drinks or equivalent per week    Comment: occ   Drug use: Never   Sexual activity: Not Currently    Birth control/protection: Abstinence, None    Comment: Had hysterectomy and no sex drive since the surgery  Other Topics Concern   Not on file  Social History Narrative   Not on file   Social Drivers of Health   Financial Resource Strain: Low Risk  (02/21/2023)   Overall Financial Resource Strain (CARDIA)    Difficulty of Paying Living Expenses: Not very hard  Food Insecurity: Food Insecurity Present (02/21/2023)   Hunger Vital Sign    Worried About Running Out of Food in the Last Year: Sometimes true    Ran Out of Food in the Last Year: Never true   Transportation Needs: No Transportation Needs (02/21/2023)   PRAPARE - Administrator, Civil Service (Medical): No    Lack of Transportation (Non-Medical): No  Physical Activity: Insufficiently Active (02/21/2023)   Exercise Vital Sign    Days of Exercise per Week: 1 day    Minutes of Exercise per Session: 10 min  Stress: Stress Concern Present (02/21/2023)   [employer] of Occupational Health - Occupational Stress Questionnaire    Feeling of Stress : Rather much  Social Connections: Socially Isolated (02/21/2023)   Social  Connection and Isolation Panel [NHANES]    Frequency of Communication with Friends and Family: More than three times a week    Frequency of Social Gatherings with Friends and Family: Once a week    Attends Religious Services: Never    Database administrator or Organizations: No    Attends Engineer, structural: Not on file    Marital Status: Divorced  Catering manager Violence: Not on file    Physical Exam: Vital signs in last 24 hours: @BP  122/76   Pulse 62   Temp 98.1 F (36.7 C) (Skin)   Ht 5\' 3"  (1.6 m)   Wt 193 lb (87.5 kg)   SpO2 95%   BMI 34.19 kg/m  GEN: NAD EYE: Sclerae anicteric ENT: MMM CV: Non-tachycardic Pulm: CTA b/l GI: Soft, NT/ND NEURO:  Alert & Oriented x 3   Doristine Locks, DO [facility]   04/02/2023 9:08 AM

## 2023-04-02 NOTE — Progress Notes (Signed)
 Sedate, gd SR, tolerated procedure well, VSS, report to RN

## 2023-04-02 NOTE — Op Note (Signed)
 [facility] Patient Name: Amanda Maynard Procedure Date: 04/02/2023 9:08 AM MRN: [id number] Endoscopist: Doristine Locks , MD, [id number] Age: 63 Referring MD:  Date of Birth: [birth year] Gender: Female Account #: [id number] Procedure:                Upper GI endoscopy Indications:              Epigastric abdominal pain, Suspected esophageal                            reflux                           Esophageal varices screening Medicines:                Monitored Anesthesia Care Procedure:                Pre-Anesthesia Assessment:                           - Prior to the procedure, a History and Physical                            was performed, and patient medications and                            allergies were reviewed. The patient's tolerance of                            previous anesthesia was also reviewed. The risks                            and benefits of the procedure and the sedation                            options and risks were discussed with the patient.                            All questions were answered, and informed consent                            was obtained. Prior Anticoagulants: The patient has                            taken no anticoagulant or antiplatelet agents. ASA                            Grade Assessment: III - A patient with severe                            systemic disease. After reviewing the risks and                            benefits, the patient was deemed in satisfactory  condition to undergo the procedure.                           After obtaining informed consent, the endoscope was                            passed under direct vision. Throughout the                            procedure, the patient's blood pressure, pulse, and                            oxygen saturations were monitored continuously. The                            GIF [id number] #[id number] was introduced through the                             mouth, and advanced to the second part of duodenum.                            The upper GI endoscopy was accomplished without                            difficulty. The patient tolerated the procedure                            well. Scope In: Scope Out: Findings:                 The examined esophagus was normal.                           The Z-line was regular and was found 36 cm from the                            incisors.                           Striped mildly erythematous mucosa without bleeding                            was found in the gastric antrum. Biopsies were                            taken with a cold forceps for histology. Estimated                            blood loss was minimal.                           Localized mildly erythematous mucosa without                            bleeding was found in the cardia.  The gastric fundus, gastric body and incisura were                            normal.                           The examined duodenum was normal. Complications:            No immediate complications. Estimated Blood Loss:     Estimated blood loss was minimal. Impression:               - Normal esophagus.                           - Z-line regular, 36 cm from the incisors.                           - Erythematous mucosa in the antrum. Biopsied.                           - Erythematous mucosa in the cardia.                           - Normal gastric fundus, gastric body and incisura.                           - Normal examined duodenum. Recommendation:           - Patient has a contact number available for                            emergencies. The signs and symptoms of potential                            delayed complications were discussed with the                            patient. Return to normal activities tomorrow.                            Written discharge instructions were provided to the                             patient.                           - Resume previous diet.                           - Continue present medications.                           - Await pathology results.                           - Recent CBC with normal Hgb/Hct. If anemia  develops, will plan for repeat EGD with APC of                            suspected GAVE at the hospital endoscopy unit.                           - Follow-up in the GI Clinic in 3 months or sooner                            as needed. Doristine Locks, MD 04/02/2023 9:30:15 AM

## 2023-04-02 NOTE — Progress Notes (Signed)
 Pt's states no medical or surgical changes since previsit or office visit.

## 2023-04-02 NOTE — Patient Instructions (Signed)
 YOU HAD AN ENDOSCOPIC PROCEDURE TODAY AT THE [facility]:   Refer to the procedure report that was given to you for any specific questions about what was found during the examination.  If the procedure report does not answer your questions, please call your gastroenterologist to clarify.  If you requested that your care partner not be given the details of your procedure findings, then the procedure report has been included in a sealed envelope for you to review at your convenience later.  YOU SHOULD EXPECT: Some feelings of bloating in the abdomen. Passage of more gas than usual.  Walking can help get rid of the air that was put into your GI tract during the procedure and reduce the bloating. If you had a lower endoscopy (such as a colonoscopy or flexible sigmoidoscopy) you may notice spotting of blood in your stool or on the toilet paper. If you underwent a bowel prep for your procedure, you may not have a normal bowel movement for a few days.  Please Note:  You might notice some irritation and congestion in your nose or some drainage.  This is from the oxygen used during your procedure.  There is no need for concern and it should clear up in a day or so.  SYMPTOMS TO REPORT IMMEDIATELY:  Following upper endoscopy (EGD)  Vomiting of blood or coffee ground material  New chest pain or pain under the shoulder blades  Painful or persistently difficult swallowing  New shortness of breath  Fever of 100F or higher  Black, tarry-looking stools  For urgent or emergent issues, a gastroenterologist can be reached at any hour by calling [phone]. Do not use MyChart messaging for urgent concerns.    DIET:  We do recommend a small meal at first, but then you may proceed to your regular diet.  Drink plenty of fluids but you should avoid alcoholic beverages for 24 hours.  MEDICATIONS: Continue present medications.  FOLLOW UP: Await pathology results. Recent CBC with normal Hbg/Hct. If  anemia develops, will plan for repeat EGD with APC of suspected GAVE at the hospital endoscopy unit.  Follow-up in the GI Clinic in 3 months or sooner as needed. Appointments not available to schedule in recovery, Dr. Frankey Shown office nurse will call you to schedule this appointment.  Thank you for allowing [place] to provide for your healthcare needs today.  ACTIVITY:  You should plan to take it easy for the rest of today and you should NOT DRIVE or use heavy machinery until tomorrow (because of the sedation medicines used during the test).    FOLLOW UP: Our staff will call the number listed on your records the next business day following your procedure.  We will call around 7:15- 8:00 am to check on you and address any questions or concerns that you may have regarding the information given to you following your procedure. If we do not reach you, we will leave a message.     If any biopsies were taken you will be contacted by phone or by letter within the next 1-3 weeks.  Please call us at [phone] if you have not heard about the biopsies in 3 weeks.    SIGNATURES/CONFIDENTIALITY: You and/or your care partner have signed paperwork which will be entered into your electronic medical record.  These signatures attest to the fact that that the information above on your After Visit Summary has been reviewed and is understood.  Full responsibility of the confidentiality of this  discharge information lies with you and/or your care-partner.

## 2023-04-03 ENCOUNTER — Ambulatory Visit: Payer: 59 | Admitting: Physical Therapy

## 2023-04-03 ENCOUNTER — Telehealth: Payer: Self-pay | Admitting: *Deleted

## 2023-04-03 NOTE — Telephone Encounter (Signed)
 No answer on  follow up call. Left message.

## 2023-04-04 LAB — SURGICAL PATHOLOGY

## 2023-04-05 ENCOUNTER — Ambulatory Visit
Admission: RE | Admit: 2023-04-05 | Discharge: 2023-04-05 | Disposition: A | Discharge status: Home / Self Care | Payer: 59 | Source: Ambulatory Visit | Attending: Physical Medicine and Rehabilitation | Admitting: Physical Medicine and Rehabilitation

## 2023-04-05 DIAGNOSIS — G894 Chronic pain syndrome: Secondary | ICD-10-CM

## 2023-04-05 DIAGNOSIS — M4316 Spondylolisthesis, lumbar region: Secondary | ICD-10-CM | POA: Diagnosis not present

## 2023-04-05 DIAGNOSIS — M5416 Radiculopathy, lumbar region: Secondary | ICD-10-CM

## 2023-04-05 DIAGNOSIS — G8929 Other chronic pain: Secondary | ICD-10-CM

## 2023-04-07 ENCOUNTER — Encounter: Payer: Self-pay | Admitting: Gastroenterology

## 2023-04-09 ENCOUNTER — Ambulatory Visit: Payer: 59 | Attending: Orthopaedic Surgery

## 2023-04-09 DIAGNOSIS — M25561 Pain in right knee: Secondary | ICD-10-CM | POA: Insufficient documentation

## 2023-04-09 DIAGNOSIS — G8929 Other chronic pain: Secondary | ICD-10-CM | POA: Diagnosis not present

## 2023-04-09 DIAGNOSIS — M6281 Muscle weakness (generalized): Secondary | ICD-10-CM | POA: Insufficient documentation

## 2023-04-09 DIAGNOSIS — R2689 Other abnormalities of gait and mobility: Secondary | ICD-10-CM | POA: Diagnosis not present

## 2023-04-09 DIAGNOSIS — M25562 Pain in left knee: Secondary | ICD-10-CM | POA: Diagnosis not present

## 2023-04-09 NOTE — Therapy (Signed)
 OUTPATIENT PHYSICAL THERAPY TREATMENT   Patient Name: MARCI POLITO MRN: [id number] DOB:28-Dec-1960, 63 y.o., female Today's Date: 04/09/2023   END OF SESSION:  PT End of Session - 04/09/23 1019     Visit Number 3    Date for PT Re-Evaluation 05/14/23    Authorization Type [employer]    PT Start Time 1014    PT Stop Time 1103    PT Time Calculation (min) 49 min    Activity Tolerance Patient tolerated treatment well    Behavior During Therapy WFL for tasks assessed/performed               Past Medical History:  Diagnosis Date   Allergy    Anxiety    Arthritis    Chicken pox    Depression    Diverticulitis    Emphysema of lung (HCC)    GERD (gastroesophageal reflux disease)    Headache    History of colon polyps    Jaundice    Lichen planopilaris    Thyroid disease    Urinary incontinence    Past Surgical History:  Procedure Laterality Date   ABDOMINAL HYSTERECTOMY  2012   APPENDECTOMY  2009   COLONOSCOPY  12/02/2016   Dr. Vista Mink. Moderate diverticulosis of the ascending colon, transverse colon, descending colon and sigmoid colon, increase in vascularity of the rectum. External hemorrhoids.   COLONOSCOPY  08/09/2013   Dr. Vista Mink. Moderate diverticulosis of the hepatic flexure, transverse colon, splenic flexure, descending colon and sigmoid colon. Polyp (7mm) in the proximal sigmoid colon. (polypectomy). External hemorrhoids.   ESOPHAGOGASTRODUODENOSCOPY  08/09/2013   Dr. Vista Mink. Normal stomach. Normal duodenum. Grade 1 esophagitis was seen in the lower third of the esophagus.   Patient Active Problem List   Diagnosis Date Noted   Supraventricular tachycardia (HCC) 12/13/2022   Palpitations 12/13/2022   Chondromalacia of both patellae 12/05/2022   Primary osteoarthritis of both hands 12/05/2022   Degeneration of intervertebral disc of lumbar region without discogenic back pain or lower extremity pain 12/05/2022   Arthritis of finger of left hand  05/10/2022   Bilateral hand pain 05/06/2022   Migraine 04/10/2022   Anxiety and depression 02/04/2022   Chronic lower back pain 12/05/2021   Chest pain 10/10/2021   Hepatitis C antibody test positive 12/13/2020   Hypothyroidism (acquired) 12/13/2020   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 Knee strengthening.   THERAPY DIAG:  Chronic pain of both knees  Muscle weakness (generalized)  Other abnormalities of gait and mobility  RATIONALE FOR EVALUATION AND TREATMENT: Rehabilitation  ONSET DATE: Chronic, worsening with recent colder weather  NEXT MD VISIT:  None scheduled / PRN with referring provider;  03/19/23 with Juanda Chance, NP - [facility] for back pain   SUBJECTIVE:  SUBJECTIVE STATEMENT: Pt reports the rainy weather is flaring up her back, knee pain is about the same but she reports the exercises are helping but request to review them.  EVAL: Pt reports she has pain everywhere and is hoping to get on disability.  She is here for her B knee pain but reports her back pain is the worst - [facility] appt [facility] to [facility] her back this afternoon.  Also has pain in most of her joints, esp hands.  She feels like her knee pain has been worse with the cold weather this winter.  She reports burning and popping in B knees with severe pain when climbing stairs.  She reports times where she wakes up with B LE numb, progressing to tingling/pin-and-needles.   PAIN: Are you having pain? Yes: NPRS scale: 4/10   Pain  location: B anterior knee/peripatellar  Pain description: grabbing, throbbing, popping, burning, soreness and aching depending on the activity  Aggravating factors: vacuuming, climbing up/down stairs, squatting, kneeling/quadruped, prolonged sitting   Relieving factors: ice, standing still, ibuprofen, Doterrra Deep Blue   Are you having pain? Yes: NPRS scale: 8/10  Pain location: midline low back & sacrum   PERTINENT HISTORY:  Osteoarthritis - multiple joints, anxiety, depression, GERD, headache, thyroid disease - hypothyroidism, urinary incontinence, SVT, lumbar DDD, chronic LBP, sciatica  PRECAUTIONS: Fall  RED FLAGS: Bowel or bladder incontinence: Yes: bladder issues since her hysterectomy  WEIGHT BEARING RESTRICTIONS: No  FALLS:  Has patient fallen in last 6 months? No, 2 falls in early 2024  LIVING ENVIRONMENT: Lives with: lives with their family (lives downstairs from mother) Lives in: House/apartment Stairs: Yes: Internal: 13 steps; on right going up and External: 5 steps; on right going up Has following equipment at home: Single point cane, Walker - 2 wheeled, shower chair, and pedal bike  OCCUPATION: Out of work since Dec 2021  PLOF: Independent and Leisure: walking the dog, washing dishes, light cooking, driving for her mother's appts, relatively sedentary   PATIENT GOALS: "To be able to get up and down from a squat and be able to crawl."   OBJECTIVE: (objective measures completed at initial evaluation unless otherwise dated)  DIAGNOSTIC FINDINGS:  10/16/22 - XR Left & Right knees: No medial or lateral compartment narrowing was noted.  Moderate patellofemoral narrowing was noted.   Impression: These findings are suggestive of moderate chondromalacia  patella of the knee.   10/16/22 - XR B hips:  No hip joint narrowing was noted.  No chondrocalcinosis was noted.   Osteoarthritic changes with mild sclerosis was noted in the SI joints.   Impression: Unremarkable  x-rays of the hip joints.   PATIENT SURVEYS:  LEFS 12 / 80 = 15.0 %  COGNITION: Overall cognitive status: Within functional limits for tasks assessed    SENSATION: Intermittent B LE numbness and tingling    EDEMA:  Not normally an issue  POSTURE:  rounded shoulders, forward head, flexed trunk, but knee alignment relatively neutral  PALPATION: decreased patellar mobility L>R with poor VMO activation on quad set  MUSCLE LENGTH: Hamstrings: mod tight R>L ITB: mild/mod tight R>L Piriformis: mild/mod tight R>L Hip flexors: mild/mod tight R>L Quads: mod tight R>L Heelcord: mod tight B  LOWER EXTREMITY ROM:  Active ROM Right eval Left eval  Knee flexion 138 141  Knee extension -2 / 0* -2 / 0*  (Blank rows = not tested, * = supported knee extension)  LOWER EXTREMITY MMT:  MMT Right  eval Left eval  Hip flexion 3+ 3+  Hip extension 4 4  Hip abduction 4- 4-  Hip adduction 4 4  Hip internal rotation 4- 4  Hip external rotation 4- 4-  Knee flexion 4 4  Knee extension 3+ 4-  Ankle dorsiflexion 4 4  Ankle plantarflexion 4- (7 SLS HR) 4- (6 SLS HR)  Ankle inversion    Ankle eversion     (Blank rows = not tested)  LOWER EXTREMITY SPECIAL TESTS:  Hip special tests: Ober's test: positive on R Knee special tests: Lateral pull sign: positive B and Patellafemoral grind test: positive B  FUNCTIONAL TESTS: (03/24/23) 5 times sit to stand: 20.03 sec; >15 sec indicates recurrent fall risk 10 meter walk test: 10.46 sec; Gait speed = 3.14 ft/sec, >2.62 ft/sec indicates community ambulator  Functional gait assessment: 19/30; 19-24 = medium risk fall   GAIT: Distance walked: clinic distances Assistive device utilized: Environmental consultant - 2 wheeled Level of assistance: Modified independence Gait pattern: lateral lean- Right and trunk flexed Comments: RW height adjusted 1 notch taller and cues provided for better upright posture and RW proximity   TODAY'S TREATMENT:  04/09/23 Therapeutic  Exercise: to improve strength, ROM, and flexibility  Recumbent Bike L2x10min Supine HS stretch with strap x 30" bil Supine ITB cross body stretch w/ strap x 30" bil Reviewed hip ADD- cues to avoid lifting hips off table Reviewed QS- good quad control SLR + QS x 10 bil SLR + ER + QS x 10 bil S/L clamshells YTB 10x3" bil S/L hip abduction x 5 bil Abdominal sets supine 10x7"  03/24/2023 PHYSICAL PERFORMANCE TEST or MEASUREMENT: 5xSTS = 20.03 sec; >15 sec indicates recurrent fall risk = 10.46 sec w/o AD Gait speed = 3.14 ft/sec; >2.62 ft/sec indicates community ambulator FGA = 19/30; 19-24 = medium risk fall  Functional Gait  Assessment  Gait assessed  Yes   Gait Level Surface Walks 20 ft in less than 7 sec but greater than 5.5 sec, uses assistive device, slower speed, mild gait deviations, or deviates 6-10 in outside of the 12 in walkway width.   Change in Gait Speed Able to smoothly change walking speed without loss of balance or gait deviation. Deviate no more than 6 in outside of the 12 in walkway width.   Gait with Horizontal Head Turns Performs head turns smoothly with slight change in gait velocity (eg, minor disruption to smooth gait path), deviates 6-10 in outside 12 in walkway width, or uses an assistive device.   Gait with Vertical Head Turns Performs task with slight change in gait velocity (eg, minor disruption to smooth gait path), deviates 6 - 10 in outside 12 in walkway width or uses assistive device   Gait and Pivot Turn Pivot turns safely in greater than 3 sec and stops with no loss of balance, or pivot turns safely within 3 sec and stops with mild imbalance, requires small steps to catch balance.   Step Over Obstacle Is able to step over one shoe box (4.5 in total height) without changing gait speed. No evidence of imbalance.   Gait with Narrow Base of Support Ambulates 7-9 steps.   Gait with Eyes Closed Walks 20 ft, slow speed, abnormal gait pattern, evidence for  imbalance, deviates 10-15 in outside 12 in walkway width. Requires more than 9 sec to ambulate 20 ft.   Ambulating Backwards Walks 20 ft, slow speed, abnormal gait pattern, evidence for imbalance, deviates 10-15 in outside 12 in walkway width.  Steps Alternating feet, must use rail.   Total Score 19   FGA comment: 19-24 = medium risk fall      THERAPEUTIC EXERCISE: To improve strength, endurance, and flexibility.  Demonstration, verbal and tactile cues throughout for technique.  Supine HS with strap x 30" bil Supine ITB stretch with strap x 30" bil Prone hip flexor & quad stretch with strap and towel under distal thigh x 30" bil Supine B quad set over towel roll 10 x 5" Hooklying B hip ADD isometric ball squeeze 10 x 5" - cues for TrA isometric Hoolkying TrA + SLR 5 x 3" bil Hoolkying TrA + SLR + hip ER 5 x 3" bil   03/19/2023 - Eval  SELF CARE:  Reviewed eval findings and role of PT in addressing identified deficits as well as instruction to improve safety with use of RW for assistive device.   THERAPEUTIC EXERCISE: To improve strength and flexibility.  Demonstration, verbal and tactile cues throughout for technique.  Rec Bike - L1 x 6 min Supine HS with strap x 30"   Supine ITB stretch with strap x 30" Prone hip flexor & quad stretch with strap x 30" Mod thomas hip flexor & quad stretch with strap x 30" Prone hip flexor & quad stretch with strap and towel under distal thigh x 30" - better stretch than previous versions Supine B quad set over towel roll 10 x 5" Hooklying B hip ADD isometric with towel roll 10 x 5", 2 sets    PATIENT EDUCATION:  Education details: PT eval findings, HEP review, and HEP progression  Person educated: Patient Education method: Explanation, Demonstration, Verbal cues, Handouts, and MedBridgeGO app updated  Education comprehension: verbalized understanding, returned demonstration, verbal cues required, and needs further education  HOME EXERCISE  PROGRAM: *Pt using MedBridgeGO app  Access Code: [email or web address] URL: [email or web address] Date: 04/09/2023 Prepared by: Verta Ellen  Exercises - Supine Hamstring Stretch with Strap  - 2 x daily - 7 x weekly - 3 reps - 30 sec hold - Supine Iliotibial Band Stretch with Strap  - 2 x daily - 7 x weekly - 3 reps - 30 sec hold - Prone Hip Flexor & Quad Stretch with Strap  - 2 x daily - 7 x weekly - 3 reps - 30 sec hold - Supine Hip Adduction Isometric with Ball  - 2 x daily - 7 x weekly - 2 sets - 10 reps - 5 sec hold - Active Straight Leg Raise with Quad Set  - 2 x daily - 7 x weekly - 2 sets - 10 reps - 3-5 sec hold - Straight Leg Raise with External Rotation  - 2 x daily - 7 x weekly - 1-2 sets - 10 reps - 3 sec hold - Clamshell with Resistance  - 1 x daily - 7 x weekly - 2 sets - 10 reps - Seated Abdominal Press into [employer]  - 1 x daily - 7 x weekly - 2 sets - 10 reps - 7 second hold   ASSESSMENT:  CLINICAL IMPRESSION: Pt requested to review her HEP today, corrected her form on some of the exercises (please see specifics under treatment) to ensure proper performance at home. Introduced core activation towards the end of session to begin working on lumbar stabilization. Pt pretty fatigued after clamshells and hip abduction, would definitely benefit from more lateral hip strengthening as well as stabilization for the spine. Octa will benefit from continued skilled PT to address ongoing knee  deficits to improve mobility and activity tolerance with decreased pain interference.   OBJECTIVE IMPAIRMENTS: Abnormal gait, decreased activity tolerance, decreased balance, decreased endurance, decreased knowledge of condition, decreased knowledge of use of DME, decreased mobility, difficulty walking, decreased strength, hypomobility, increased fascial restrictions, impaired perceived functional ability, increased muscle spasms, impaired flexibility, impaired sensation, improper body  mechanics, postural dysfunction, and pain.   ACTIVITY LIMITATIONS: carrying, lifting, bending, sitting, standing, squatting, sleeping, stairs, transfers, bed mobility, toileting, locomotion level, and caring for others  PARTICIPATION LIMITATIONS: meal prep, cleaning, laundry, driving, shopping, community activity, and walking her dog  PERSONAL FACTORS: Age, Fitness, Past/current experiences, Time since onset of injury/illness/exacerbation, and 3+ comorbidities: Osteoarthritis - multiple joints, anxiety, depression, GERD, headache, thyroid disease - hypothyroidism, urinary incontinence, SVT, lumbar DDD, chronic LBP, sciatica  are also affecting patient's functional outcome.   REHAB POTENTIAL: Good  CLINICAL DECISION MAKING: Unstable/unpredictable  EVALUATION COMPLEXITY: High   GOALS: Goals reviewed with patient? Yes  SHORT TERM GOALS: Target date:  04/16/2023  Patient will be independent with initial HEP. Baseline:  Goal status: IN PROGRESS  2.  Patient will report at least 25% improvement in B knee pain to improve QOL. Baseline: 5/10 on eval, at worst 10/10  Goal status: IN PROGRESS  LONG TERM GOALS: Target date: 05/14/2023  Patient will be independent with advanced/ongoing HEP to improve outcomes and carryover.  Baseline:  Goal status: IN PROGRESS  2.  Patient will report at least 50-75% improvement in B knee pain to improve QOL. Baseline: 5/10 currently, at worst 10/10  Goal status: IN PROGRESS  3.  Patient will demonstrate improved B LE strength to >/= 4+/5 for improved stability and ease of mobility. Baseline: Refer to above LE MMT table Goal status: IN PROGRESS  4.  Patient will be able to ambulate 600' with or w/o LRAD and normal gait pattern without increased pain to access community.  Baseline: Antalgic gait with flexed trunk using RW Goal status: IN PROGRESS  5. Patient will be able to ascend/descend stairs with 1 HR and reciprocal step pattern safely to access  home and community.  Baseline: NT, but pt reports severe pain with stair negotiation at home Goal status: IN PROGRESS  6.  Patient will report ability to squat sufficient to complete normal daily tasks w/o limitation due to pain or weakness. Baseline:  Goal status: IN PROGRESS   7.  Patient will report >/= 25/80 on LEFS (MCID = 9 pts) to demonstrate improved functional ability. Baseline: 12 / 80 = 15.0 % Goal status: IN PROGRESS  8.  Patient will demonstrate at least 19/30 on FGA to decrease risk of falls. Baseline: TBD Goal status: IN PROGRESS    PLAN:  PT FREQUENCY: 1-2x/week  PT DURATION: 8 weeks  PLANNED INTERVENTIONS: 97164- PT Re-evaluation, 97110-Therapeutic exercises, 97530- Therapeutic activity, 97112- Neuromuscular re-education, 97535- Self Care, 14782- Manual therapy, [phone]- Gait training, [phone]- Aquatic Therapy, [phone]- Electrical stimulation (unattended), [phone]- Electrical stimulation (manual), 97016- Vasopneumatic device, [id number]- Ultrasound, [id number]- Ionotophoresis 4mg /ml Dexamethasone, Patient/Family education, Balance training, Stair training, Taping, Dry Needling, Joint mobilization, DME instructions, Cryotherapy, Moist heat, and 97750- Physical Performance Test   PLAN FOR NEXT SESSION: progress quad and proximal LE strengthening - review/update HEP accordingly; start working on core engagement; possible trial of kinesiotaping for chondromalacia patella; new eval for low back pain when knee pain stabilized/resolved   Darleene Cleaver, PTA 04/09/2023, 11:16 AM

## 2023-04-16 ENCOUNTER — Ambulatory Visit: Payer: 59 | Admitting: Physical Therapy

## 2023-04-20 NOTE — Progress Notes (Unsigned)
 [facility] [address], [address] [place], [place] [address] [phone]  Date:  04/23/2023   Name:  Amanda Maynard   DOB:  [birth year]   MRN:  [id number]  PCP:  Pearline Cables, MD    Chief Complaint: No chief complaint on file.   History of Present Illness:  Amanda Maynard is a 63 y.o. 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  Normal in January Can update lipid profile  Lexapro Levothyroxine 75 Patient Active Problem List   Diagnosis Date Noted   Supraventricular tachycardia (HCC) 12/13/2022   Palpitations 12/13/2022   Chondromalacia of both patellae 12/05/2022   Primary osteoarthritis of both hands 12/05/2022   Degeneration of intervertebral disc of lumbar region without discogenic back pain or lower extremity pain 12/05/2022   Arthritis of finger of left hand 05/10/2022   Bilateral hand pain 05/06/2022   Migraine 04/10/2022   Anxiety and depression 02/04/2022   Chronic lower back pain 12/05/2021   Chest pain 10/10/2021   Hepatitis C antibody test positive 12/13/2020   Hypothyroidism (acquired) 12/13/2020   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 Vista Mink. Moderate diverticulosis of the ascending colon, transverse colon, descending colon and sigmoid colon, increase in vascularity of the rectum. External hemorrhoids.   COLONOSCOPY  08/09/2013   Dr. Vista Mink. Moderate diverticulosis of the hepatic flexure, transverse colon, splenic flexure, descending colon and sigmoid colon. Polyp (7mm) in the proximal sigmoid colon. (polypectomy). External hemorrhoids.   ESOPHAGOGASTRODUODENOSCOPY  08/09/2013   Dr. Vista Mink. Normal stomach. Normal duodenum. Grade 1 esophagitis was seen in the lower third of the esophagus.    Social History   Tobacco Use   Smoking status: Every Day    Current packs/day: 0.50    Average packs/day: 0.5 packs/day for 30.0 years (15.0 ttl pk-yrs)    Types: Cigarettes    Passive exposure: Past   Tobacco comments:    Have quit twice in 30 years but start again when stress level is too high  Vaping Use   Vaping status: Never Used  Substance Use Topics   Alcohol use: Yes    Alcohol/week: 6.0 standard drinks of alcohol    Types: 3 Cans  of beer, 3 Standard drinks or equivalent per week    Comment: occ   Drug use: Never    Family History  Problem Relation Age of Onset   Breast cancer Mother 32   Heart disease Mother    Hearing loss Mother    Depression Mother    Cancer Mother    Arthritis Mother    Cancer Father    Hyperlipidemia Father    Depression Sister    Depression Sister    Heart disease Sister    Heart disease Maternal Grandmother    Cancer Maternal Grandfather    Arthritis Paternal Grandmother    Rheum arthritis Paternal Grandmother    Cancer Paternal Grandfather     Heart attack Paternal Grandfather    Esophageal cancer Neg Hx    Colon cancer Neg Hx    Stomach cancer Neg Hx    Rectal cancer Neg Hx     Allergies  Allergen Reactions   Molds & Smuts Shortness Of Breath   Sertraline Other (See Comments)    "Crazy dreams"    Sulfa Antibiotics Itching, Nausea Only, Rash and Nausea And Vomiting    Medication list has been reviewed and updated.  Current Outpatient Medications on File Prior to Visit  Medication Sig Dispense Refill   escitalopram (LEXAPRO) 10 MG tablet Take 1 tablet (10 mg total) by mouth daily. 90 tablet 0   ibuprofen (ADVIL) 800 MG tablet Take 1 tablet (800 mg total) by mouth every 8 (eight) hours as needed. (Patient not taking: Reported on 04/02/2023) 30 tablet 1   IBUPROFEN PO Take 1 tablet by mouth as needed (Pain).     pantoprazole (PROTONIX) 40 MG tablet Take 1 tablet (40 mg total) by mouth daily. (Patient taking differently: Take 40 mg by mouth as needed (Indigestion).) 30 tablet 0   SYNTHROID 75 MCG tablet Take 1 tablet (75 mcg total) by mouth daily before breakfast. 90 tablet 1   Current Facility-Administered Medications on File Prior to Visit  Medication Dose Route Frequency Provider Last Rate Last Admin   0.9 %  sodium chloride infusion  500 mL Intravenous Once Cirigliano, Vito V, DO        Review of Systems:  As per HPI- otherwise negative.   Physical Examination: There were no vitals filed for this visit. There were no vitals filed for this visit. There is no height or weight on file to calculate BMI. Ideal Body Weight:    GEN: no acute distress. HEENT: Atraumatic, Normocephalic.  Ears and Nose: No external deformity. CV: RRR, No M/G/R. No JVD. No thrill. No extra heart sounds. PULM: CTA B, no wheezes, crackles, rhonchi. No retractions. No resp. distress. No accessory muscle use. ABD: S, NT, ND, +BS. No rebound. No HSM. EXTR: No c/c/e PSYCH: Normally interactive. Conversant.    Assessment and  Plan: *** Physical exam today.  Encouraged healthy diet and exercise routine Signed Abbe Amsterdam, MD

## 2023-04-20 NOTE — Patient Instructions (Incomplete)
 It was great to see you again today, I will be in touch with your lab results

## 2023-04-22 ENCOUNTER — Encounter: Payer: Self-pay | Admitting: Physical Medicine and Rehabilitation

## 2023-04-22 ENCOUNTER — Ambulatory Visit: Admitting: Physical Medicine and Rehabilitation

## 2023-04-22 DIAGNOSIS — G8929 Other chronic pain: Secondary | ICD-10-CM

## 2023-04-22 DIAGNOSIS — M47816 Spondylosis without myelopathy or radiculopathy, lumbar region: Secondary | ICD-10-CM | POA: Diagnosis not present

## 2023-04-22 DIAGNOSIS — M5442 Lumbago with sciatica, left side: Secondary | ICD-10-CM | POA: Diagnosis not present

## 2023-04-22 DIAGNOSIS — M4316 Spondylolisthesis, lumbar region: Secondary | ICD-10-CM

## 2023-04-22 DIAGNOSIS — M5416 Radiculopathy, lumbar region: Secondary | ICD-10-CM | POA: Diagnosis not present

## 2023-04-22 DIAGNOSIS — M5441 Lumbago with sciatica, right side: Secondary | ICD-10-CM

## 2023-04-22 NOTE — Progress Notes (Signed)
 Amanda Maynard - 63 y.o. female MRN [id number]  Date of birth: [birth year]  Office Visit Note: Visit Date: 04/22/2023 PCP: Pearline Cables, MD Referred by: Pearline Cables, MD  Subjective: Chief Complaint  Patient presents with   Lower Back - Pain   HPI: Amanda Maynard is a 63 y.o. female who comes in today Chronic, worsening and severe bilateral lower back pain radiating to buttocks, hips and down lateral legs to feet, pain does radiate all the way up to her upper back/neck. Pain ongoing since 2008. She reports difficulty sleeping due to severe pain. Also reports severe pain when performing household chores such as vacuuming and mopping. Her pain seems to remain constant. States she experiences severe different types of pain, she describes her pain as sharp and stabbing sensation, states her pain radiates all the way up her neck. Also reports frequent numbness/tingling to bilateral legs. Some relief of pain with home exercise regimen, rest and use of medications. He is currently undergoing physical therapy for knee and lower back issues. Recent lumbar MRI imaging exhibits chronic grade 1 anterolisthesis of L4 on L5 appears mildly progressed since 2023 radiographs, with moderate to severe facet arthropathy. There is also mild bilateral foraminal stenosis at this level.  She was previously treated at pain center in [place], she has undergone multiple lumbar injections with good relief of pain. She does use walker to assist with ambulation. States she has filed for formal disability. Patient denies focal weakness. No recent trauma or falls.       Review of Systems  Musculoskeletal:  Positive for back pain and myalgias.  Neurological:  Positive for tingling. Negative for focal weakness and weakness.  All other systems reviewed and are negative.  Otherwise per HPI.  Assessment & Plan: Visit Diagnoses:    ICD-10-CM   1. Chronic bilateral low back pain with bilateral sciatica   M54.42 Ambulatory referral to Physical Medicine Rehab   M54.41    G89.29     2. Radiculopathy, lumbar region  M54.16 Ambulatory referral to Physical Medicine Rehab    3. Anterolisthesis of lumbar spine  M43.16 Ambulatory referral to Physical Medicine Rehab    4. Facet arthropathy, lumbar  M47.816 Ambulatory referral to Physical Medicine Rehab       Plan: Findings:  Chronic, worsening and severe bilateral lower back pain radiating to buttocks, hips and down lateral legs to feet, pain does radiate all the way up to her upper back/neck. Right sided pain seems to be most severe. Patients clinical presentation and exam are consistent with lumbar radiculopathy, more of L5 nerve pattern. There is chronic grade 1 anterolisthesis of L4 on L5, also moderate to severe facet arthropathy. We discussed treatment plan in detail today. Next step is to perform diagnostic and hopefully therapeutic right L4-L5 interlaminar epidural steroid injection under fluoroscopic guidance. She is not currently taking anticoagulant medications. If good relief of pain with injection we can repeat this procedure infrequently as needed. She is concerned about out of pocket cost with this injection, however we will request approval with her insurance company. She has no questions regarding injection procedure. No red flag symptoms noted upon exam today.     Meds & Orders: No orders of the defined types were placed in this encounter.   Orders Placed This Encounter  Procedures   Ambulatory referral to Physical Medicine Rehab    Follow-up: Return for Right L4-L5 interlaminar epidural steroid injection.   Procedures: No procedures performed  Clinical History: CLINICAL DATA:  63 year old female with persistent low back pain. Bilateral buttock pain.   EXAM: MRI LUMBAR SPINE WITHOUT CONTRAST   TECHNIQUE: Multiplanar, multisequence MR imaging of the lumbar spine was performed. No intravenous contrast was administered.    COMPARISON:  Lumbar radiographs 04/18/2021.   FINDINGS: Segmentation:  Normal on the comparison.   Alignment: Lumbar lordosis has not significantly changed since 2023 except chronic grade 1 anterolisthesis of L4 on L5 does appear mildly progressed, now 4-5 mm.   Vertebrae: Normal background bone marrow signal. Maintained vertebral body height. Sclerotic and benign appearing marrow signal area in the central L4 body (series 105, image 9), probably a benign fibrous lesion. Incidental superimposed L5 vertebral body hemangiomas (normal variant). Occasional other lumbar and lower thoracic vertebral hemangiomas (T11, normal variant).   There is superimposed degenerative appearing patchy marrow edema at the T10-T11 anterior endplates (series 119, image 8).   But no other marrow edema or evidence of acute osseous abnormality. Intact visible sacrum and SI joints.   Conus medullaris and cauda equina: Conus extends to the L1 level. No lower spinal cord or conus signal abnormality. Normal cauda equina nerve roots.   Paraspinal and other soft tissues: Benign appearing left renal midpole cyst (no follow-up imaging recommended). Negative other Visualized abdominal viscera and paraspinal soft tissues.   Disc levels:   T10-T11: Subtle anterolisthesis with disc bulging. Moderate facet hypertrophy. No spinal stenosis. Mild if any right T10 foraminal stenosis.   T11-T12: Negative.   T12-L1:  Negative.   L1-L2:  Negative.   L2-L3: Mild disc desiccation and circumferential disc bulge. Subtle retrolisthesis at this level. Mild to moderate facet hypertrophy, trace degenerative facet joint fluid. No stenosis.   L3-L4: Minimal disc bulging. Moderate facet and ligament flavum hypertrophy, greater on the right, with trace degenerative facet joint fluid. No stenosis.   L4-L5: Grade 1 anterolisthesis. Disc space loss. Circumferential disc/pseudo disc. Moderate ligament flavum and moderate to  severe bilateral facet hypertrophy. No spinal or convincing lateral recess stenosis. Up to mild bilateral L4 foraminal stenosis.   L5-S1: Mild central and left subarticular posterior disc bulging or protrusions (series 113, image 36), with moderate facet hypertrophy. But capacious spinal canal. No stenosis at this level.   IMPRESSION: 1. Chronic grade 1 anterolisthesis of L4 on L5 appears mildly progressed since 2023 radiographs, with mild disc but moderate to severe facet arthropathy. Up to mild bilateral L4 foraminal stenosis. No spinal stenosis. 2. Generally mild for age lumbar spine degeneration elsewhere and no convincing neural impingement. Although up to moderate facet hypertrophy elsewhere, subtle retrolisthesis at L2-L3. 3. Mild acute on chronic lower thoracic endplate degeneration at T10-T11. Subtle anterolisthesis there.     Electronically Signed   By: Odessa Fleming M.D.   On: 04/17/2023 12:46   She reports that she has been smoking cigarettes. She has a 15 pack-year smoking history. She has been exposed to tobacco smoke. She does not have any smokeless tobacco history on file. No results for input(s): "HGBA1C", "LABURIC" in the last 8760 hours.  Objective:  VS:  HT:    WT:   BMI:     BP:   HR: bpm  TEMP: ( )  RESP:  Physical Exam Vitals and nursing note reviewed.  HENT:     Head: Normocephalic and atraumatic.     Right Ear: External ear normal.     Left Ear: External ear normal.     Nose: Nose normal.     Mouth/Throat:  Mouth: Mucous membranes are moist.  Eyes:     Extraocular Movements: Extraocular movements intact.  Cardiovascular:     Rate and Rhythm: Normal rate.     Pulses: Normal pulses.  Pulmonary:     Effort: Pulmonary effort is normal.  Abdominal:     General: Abdomen is flat. There is no distension.  Musculoskeletal:        General: Tenderness present.     Cervical back: Normal range of motion.     Comments: Patient rises from seated position  to standing without difficulty. Good lumbar range of motion. No pain noted with facet loading. 5/5 strength noted with bilateral hip flexion, knee flexion/extension, ankle dorsiflexion/plantarflexion and EHL. No clonus noted bilaterally. No pain upon palpation of greater trochanters. No pain with internal/external rotation of bilateral hips. Sensation intact bilaterally. Negative slump test bilaterally. Ambulates with cane, gait slow and unsteady.     Skin:    General: Skin is warm and dry.     Capillary Refill: Capillary refill takes less than 2 seconds.  Neurological:     General: No focal deficit present.     Mental Status: She is alert and oriented to person, place, and time.  Psychiatric:        Mood and Affect: Mood normal.        Behavior: Behavior normal.     Ortho Exam  Imaging: No results found.  Past Medical/Family/Surgical/Social History: Medications & Allergies reviewed per EMR, new medications updated. Patient Active Problem List   Diagnosis Date Noted   Supraventricular tachycardia (HCC) 12/13/2022   Palpitations 12/13/2022   Chondromalacia of both patellae 12/05/2022   Primary osteoarthritis of both hands 12/05/2022   Degeneration of intervertebral disc of lumbar region without discogenic back pain or lower extremity pain 12/05/2022   Arthritis of finger of left hand 05/10/2022   Bilateral hand pain 05/06/2022   Migraine 04/10/2022   Anxiety and depression 02/04/2022   Chronic lower back pain 12/05/2021   Chest pain 10/10/2021   Hepatitis C antibody test positive 12/13/2020   Hypothyroidism (acquired) 12/13/2020   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   Arthritis 02/05/2020   Sciatica 02/05/2020   Past Medical History:  Diagnosis Date   Allergy    Anxiety    Arthritis    Chicken pox    Depression    Diverticulitis    Emphysema of lung (HCC)    GERD (gastroesophageal reflux disease)    Headache    History of colon polyps    Jaundice    Lichen planopilaris     Thyroid disease    Urinary incontinence    Family History  Problem Relation Age of Onset   Breast cancer Mother 67   Heart disease Mother    Hearing loss Mother    Depression Mother    Cancer Mother    Arthritis Mother    Cancer Father    Hyperlipidemia Father    Depression Sister    Depression Sister    Heart disease Sister    Heart disease Maternal Grandmother    Cancer Maternal Grandfather    Arthritis Paternal Grandmother    Rheum arthritis Paternal Grandmother    Cancer Paternal Grandfather    Heart attack Paternal Grandfather    Esophageal cancer Neg Hx    Colon cancer Neg Hx    Stomach cancer Neg Hx    Rectal cancer Neg Hx    Past Surgical History:  Procedure Laterality Date   ABDOMINAL HYSTERECTOMY  2012  APPENDECTOMY  2009   COLONOSCOPY  12/02/2016   Dr. Vista Mink. Moderate diverticulosis of the ascending colon, transverse colon, descending colon and sigmoid colon, increase in vascularity of the rectum. External hemorrhoids.   COLONOSCOPY  08/09/2013   Dr. Vista Mink. Moderate diverticulosis of the hepatic flexure, transverse colon, splenic flexure, descending colon and sigmoid colon. Polyp (7mm) in the proximal sigmoid colon. (polypectomy). External hemorrhoids.   ESOPHAGOGASTRODUODENOSCOPY  08/09/2013   Dr. Vista Mink. Normal stomach. Normal duodenum. Grade 1 esophagitis was seen in the lower third of the esophagus.   Social History   Occupational History   Not on file  Tobacco Use   Smoking status: Every Day    Current packs/day: 0.50    Average packs/day: 0.5 packs/day for 30.0 years (15.0 ttl pk-yrs)    Types: Cigarettes    Passive exposure: Past   Smokeless tobacco: Not on file   Tobacco comments:    Have quit twice in 30 years but start again when stress level is too high  Vaping Use   Vaping status: Never Used  Substance and Sexual Activity   Alcohol use: Yes    Alcohol/week: 6.0 standard drinks of alcohol    Types: 3 Cans of beer, 3 Standard  drinks or equivalent per week    Comment: occ   Drug use: Never   Sexual activity: Not Currently    Birth control/protection: Abstinence, None    Comment: Had hysterectomy and no sex drive since the surgery

## 2023-04-22 NOTE — Progress Notes (Signed)
 Pain Scale   Average Pain 8 MRI Review        +Driver, -BT, -Dye Allergies.

## 2023-04-23 ENCOUNTER — Other Ambulatory Visit (HOSPITAL_BASED_OUTPATIENT_CLINIC_OR_DEPARTMENT_OTHER): Payer: Self-pay

## 2023-04-23 ENCOUNTER — Encounter: Payer: Self-pay | Admitting: Family Medicine

## 2023-04-23 ENCOUNTER — Ambulatory Visit

## 2023-04-23 ENCOUNTER — Ambulatory Visit (INDEPENDENT_AMBULATORY_CARE_PROVIDER_SITE_OTHER): Payer: 59 | Admitting: Family Medicine

## 2023-04-23 VITALS — BP 122/80 | HR 89 | Temp 98.1°F | Resp 18 | Ht 63.0 in | Wt 192.0 lb

## 2023-04-23 DIAGNOSIS — Z131 Encounter for screening for diabetes mellitus: Secondary | ICD-10-CM | POA: Diagnosis not present

## 2023-04-23 DIAGNOSIS — E2839 Other primary ovarian failure: Secondary | ICD-10-CM

## 2023-04-23 DIAGNOSIS — M25562 Pain in left knee: Secondary | ICD-10-CM | POA: Diagnosis not present

## 2023-04-23 DIAGNOSIS — Z13220 Encounter for screening for lipoid disorders: Secondary | ICD-10-CM | POA: Diagnosis not present

## 2023-04-23 DIAGNOSIS — K219 Gastro-esophageal reflux disease without esophagitis: Secondary | ICD-10-CM

## 2023-04-23 DIAGNOSIS — F32A Depression, unspecified: Secondary | ICD-10-CM

## 2023-04-23 DIAGNOSIS — R2689 Other abnormalities of gait and mobility: Secondary | ICD-10-CM

## 2023-04-23 DIAGNOSIS — G8929 Other chronic pain: Secondary | ICD-10-CM | POA: Diagnosis not present

## 2023-04-23 DIAGNOSIS — E039 Hypothyroidism, unspecified: Secondary | ICD-10-CM

## 2023-04-23 DIAGNOSIS — Z8639 Personal history of other endocrine, nutritional and metabolic disease: Secondary | ICD-10-CM | POA: Diagnosis not present

## 2023-04-23 DIAGNOSIS — M6281 Muscle weakness (generalized): Secondary | ICD-10-CM

## 2023-04-23 DIAGNOSIS — R5383 Other fatigue: Secondary | ICD-10-CM

## 2023-04-23 DIAGNOSIS — Z122 Encounter for screening for malignant neoplasm of respiratory organs: Secondary | ICD-10-CM | POA: Diagnosis not present

## 2023-04-23 DIAGNOSIS — Z0000 Encounter for general adult medical examination without abnormal findings: Secondary | ICD-10-CM

## 2023-04-23 DIAGNOSIS — M25561 Pain in right knee: Secondary | ICD-10-CM | POA: Diagnosis not present

## 2023-04-23 DIAGNOSIS — E785 Hyperlipidemia, unspecified: Secondary | ICD-10-CM

## 2023-04-23 LAB — LIPID PANEL
Cholesterol: 207 mg/dL — ABNORMAL HIGH (ref 0–200)
HDL: 53.6 mg/dL (ref >39.00)
LDL Cholesterol: 134 mg/dL — ABNORMAL HIGH (ref 0–99)
NonHDL: 152.95
Total CHOL/HDL Ratio: 4
Triglycerides: 95 mg/dL (ref 0.0–149.0)
VLDL: 19 mg/dL (ref 0.0–40.0)

## 2023-04-23 LAB — BASIC METABOLIC PANEL
BUN: 12 mg/dL (ref 6–23)
CO2: 29 meq/L (ref 19–32)
Calcium: 9.6 mg/dL (ref 8.4–10.5)
Chloride: 106 meq/L (ref 96–112)
Creatinine, Ser: 0.78 mg/dL (ref 0.40–1.20)
GFR: 80.93 mL/min (ref >60.00)
Glucose, Bld: 103 mg/dL — ABNORMAL HIGH (ref 70–99)
Potassium: 4.9 meq/L (ref 3.5–5.1)
Sodium: 143 meq/L (ref 135–145)

## 2023-04-23 LAB — HEMOGLOBIN A1C: Hgb A1c MFr Bld: 5.4 % (ref 4.6–6.5)

## 2023-04-23 LAB — TSH: TSH: 1.59 u[IU]/mL (ref 0.35–5.50)

## 2023-04-23 LAB — FERRITIN: Ferritin: 25.1 ng/mL (ref 10.0–291.0)

## 2023-04-23 MED ORDER — PANTOPRAZOLE SODIUM 40 MG PO TBEC
40.0000 mg | DELAYED_RELEASE_TABLET | ORAL | 3 refills | Status: AC | PRN
  Filled 2023-04-23: qty 30, 30d supply, fill #0
  Filled 2023-06-17: qty 30, 30d supply, fill #1
  Filled 2023-07-15: qty 30, 30d supply, fill #2
  Filled 2023-09-11: qty 30, 30d supply, fill #3
  Filled 2023-10-15: qty 30, 30d supply, fill #4
  Filled 2023-11-21: qty 30, 30d supply, fill #5
  Filled 2023-12-22: qty 30, 30d supply, fill #6
  Filled 2024-01-26: qty 30, 30d supply, fill #7
  Filled 2024-02-20: qty 30, 30d supply, fill #8

## 2023-04-23 MED ORDER — ESCITALOPRAM OXALATE 10 MG PO TABS
10.0000 mg | ORAL_TABLET | Freq: Every day | ORAL | 3 refills | Status: AC
  Filled 2023-04-23: qty 30, 30d supply, fill #0
  Filled 2023-05-20: qty 30, 30d supply, fill #1
  Filled 2023-06-17: qty 30, 30d supply, fill #2
  Filled 2023-07-15: qty 30, 30d supply, fill #3
  Filled 2023-08-17: qty 30, 30d supply, fill #4
  Filled 2023-09-11: qty 30, 30d supply, fill #5
  Filled 2023-10-15: qty 30, 30d supply, fill #6
  Filled 2023-11-21: qty 30, 30d supply, fill #7
  Filled 2023-12-22: qty 30, 30d supply, fill #8
  Filled 2024-01-26: qty 30, 30d supply, fill #9
  Filled 2024-02-20: qty 30, 30d supply, fill #10

## 2023-04-23 NOTE — Therapy (Signed)
 OUTPATIENT PHYSICAL THERAPY TREATMENT   Patient Name: CLEMMA JOHNSEN MRN: [id number] DOB:December 20, 1960, 63 y.o., female Today's Date: 04/23/2023   END OF SESSION:  PT End of Session - 04/23/23 1148     Visit Number 4    Date for PT Re-Evaluation 05/14/23    Authorization Type [employer]    PT Start Time 1102    PT Stop Time 1145    PT Time Calculation (min) 43 min    Activity Tolerance Patient tolerated treatment well    Behavior During Therapy WFL for tasks assessed/performed                Past Medical History:  Diagnosis Date   Allergy    Anxiety    Arthritis    Chicken pox    Depression    Diverticulitis    Emphysema of lung (HCC)    GERD (gastroesophageal reflux disease)    Headache    History of colon polyps    Jaundice    Lichen planopilaris    Thyroid disease    Urinary incontinence    Past Surgical History:  Procedure Laterality Date   ABDOMINAL HYSTERECTOMY  2012   APPENDECTOMY  2009   COLONOSCOPY  12/02/2016   Dr. Vista Mink. Moderate diverticulosis of the ascending colon, transverse colon, descending colon and sigmoid colon, increase in vascularity of the rectum. External hemorrhoids.   COLONOSCOPY  08/09/2013   Dr. Vista Mink. Moderate diverticulosis of the hepatic flexure, transverse colon, splenic flexure, descending colon and sigmoid colon. Polyp (7mm) in the proximal sigmoid colon. (polypectomy). External hemorrhoids.   ESOPHAGOGASTRODUODENOSCOPY  08/09/2013   Dr. Vista Mink. Normal stomach. Normal duodenum. Grade 1 esophagitis was seen in the lower third of the esophagus.   Patient Active Problem List   Diagnosis Date Noted   Supraventricular tachycardia (HCC) 12/13/2022   Palpitations 12/13/2022   Chondromalacia of both patellae 12/05/2022   Primary osteoarthritis of both hands 12/05/2022   Degeneration of intervertebral disc of lumbar region without discogenic back pain or lower extremity pain 12/05/2022   Arthritis of finger of left  hand 05/10/2022   Bilateral hand pain 05/06/2022   Migraine 04/10/2022   Anxiety and depression 02/04/2022   Chronic lower back pain 12/05/2021   Chest pain 10/10/2021   Hepatitis C antibody test positive 12/13/2020   Hypothyroidism (acquired) 12/13/2020   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 Knee strengthening.   THERAPY DIAG:  Chronic pain of both knees  Muscle weakness (generalized)  Other abnormalities of gait and mobility  RATIONALE FOR EVALUATION AND TREATMENT: Rehabilitation  ONSET DATE: Chronic, worsening with recent colder weather  NEXT MD VISIT:  None scheduled / PRN with referring provider;  03/19/23 with Juanda Chance, NP - [facility] for back pain   SUBJECTIVE:  SUBJECTIVE STATEMENT: Pt reports she got sick so she missed her last appointment. Her knees are feeling better but her back hurts, saw her orthopedist and she will do an injection soon.  EVAL: Pt reports she has pain everywhere and is hoping to get on disability.  She is here for her B knee pain but reports her back pain is the worst - [facility] to [facility] for her back this afternoon.  Also has pain in most of her joints, esp hands.  She feels like her knee pain has been worse with the cold weather this winter.  She reports burning and popping in B knees with severe pain when climbing stairs.  She reports times where she wakes up with B LE numb, progressing to tingling/pin-and-needles.   PAIN: Are you having pain? Yes: NPRS scale:  0/10   Pain location: B anterior knee/peripatellar  Pain description: grabbing, throbbing, popping, burning, soreness and aching depending on the activity  Aggravating factors: vacuuming, climbing up/down stairs, squatting, kneeling/quadruped, prolonged sitting   Relieving factors: ice, standing still, ibuprofen, Doterrra Deep Blue   Are you having pain? Yes: NPRS scale: 8/10  Pain location: midline low back & sacrum   PERTINENT HISTORY:  Osteoarthritis - multiple joints, anxiety, depression, GERD, headache, thyroid disease - hypothyroidism, urinary incontinence, SVT, lumbar DDD, chronic LBP, sciatica  PRECAUTIONS: Fall  RED FLAGS: Bowel or bladder incontinence: Yes: bladder issues since her hysterectomy  WEIGHT BEARING RESTRICTIONS: No  FALLS:  Has patient fallen in last 6 months? No, 2 falls in early 2024  LIVING ENVIRONMENT: Lives with: lives with their family (lives downstairs from mother) Lives in: House/apartment Stairs: Yes: Internal: 13 steps; on right going up and External: 5 steps; on right going up Has following equipment at home: Single point cane, Walker - 2 wheeled, shower chair, and pedal bike  OCCUPATION: Out of work since Dec 2021  PLOF: Independent and Leisure: walking the dog, washing dishes, light cooking, driving for her mother's appts, relatively sedentary   PATIENT GOALS: "To be able to get up and down from a squat and be able to crawl."   OBJECTIVE: (objective measures completed at initial evaluation unless otherwise dated)  DIAGNOSTIC FINDINGS:  10/16/22 - XR Left & Right knees: No medial or lateral compartment narrowing was noted.  Moderate patellofemoral narrowing was noted.   Impression: These findings are suggestive of moderate chondromalacia  patella of the knee.   10/16/22 - XR B hips:  No hip joint narrowing was noted.  No chondrocalcinosis was noted.   Osteoarthritic changes with mild sclerosis was noted in the SI joints.   Impression:  Unremarkable x-rays of the hip joints.   PATIENT SURVEYS:  LEFS 12 / 80 = 15.0 %  COGNITION: Overall cognitive status: Within functional limits for tasks assessed    SENSATION: Intermittent B LE numbness and tingling    EDEMA:  Not normally an issue  POSTURE:  rounded shoulders, forward head, flexed trunk, but knee alignment relatively neutral  PALPATION: decreased patellar mobility L>R with poor VMO activation on quad set  MUSCLE LENGTH: Hamstrings: mod tight R>L ITB: mild/mod tight R>L Piriformis: mild/mod tight R>L Hip flexors: mild/mod tight R>L Quads: mod tight R>L Heelcord: mod tight B  LOWER EXTREMITY ROM:  Active ROM Right eval Left eval  Knee flexion 138 141  Knee extension -2 / 0* -2 / 0*  (Blank rows = not tested, * = supported knee extension)  LOWER EXTREMITY MMT:  MMT Right eval Left eval  Hip flexion 3+ 3+  Hip extension 4 4  Hip abduction 4- 4-  Hip adduction 4 4  Hip internal rotation 4- 4  Hip external rotation 4- 4-  Knee flexion 4 4  Knee extension 3+ 4-  Ankle dorsiflexion 4 4  Ankle plantarflexion 4- (7 SLS HR) 4- (6 SLS HR)  Ankle inversion    Ankle eversion     (Blank rows = not tested)  LOWER EXTREMITY SPECIAL TESTS:  Hip special tests: Ober's test: positive on R Knee special tests: Lateral pull sign: positive B and Patellafemoral grind test: positive B  FUNCTIONAL TESTS: (03/24/23) 5 times sit to stand: 20.03 sec; >15 sec indicates recurrent fall risk 10 meter walk test: 10.46 sec; Gait speed = 3.14 ft/sec, >2.62 ft/sec indicates community ambulator  Functional gait assessment: 19/30; 19-24 = medium risk fall   GAIT: Distance walked: clinic distances Assistive device utilized: Environmental consultant - 2 wheeled Level of assistance: Modified independence Gait pattern: lateral lean- Right and trunk flexed Comments: RW height adjusted 1 notch taller and cues provided for better upright posture and RW proximity   TODAY'S TREATMENT:   04/23/23 Therapeutic Exercise: to improve strength, ROM, and flexibility  Nustep L5x56min UE/LE Standing hip abduction x 10  Standing hip extension x 10  Seated LAQ + ball squeeze x 10 bil Supine SLR with ER x 10- pt asked to reviewed S/L clamshell YTB 2 x 10 bil Bridges with TrA x 10 Supine LE raise and lower  04/09/23 Therapeutic Exercise: to improve strength, ROM, and flexibility  Recumbent Bike L2x20min Supine HS stretch with strap x 30" bil Supine ITB cross body stretch w/ strap x 30" bil Reviewed hip ADD- cues to avoid lifting hips off table Reviewed QS- good quad control SLR + QS x 10 bil SLR + ER + QS x 10 bil S/L clamshells YTB 10x3" bil S/L hip abduction x 5 bil Abdominal sets supine 10x7"  03/24/2023 PHYSICAL PERFORMANCE TEST or MEASUREMENT: 5xSTS = 20.03 sec; >15 sec indicates recurrent fall risk = 10.46 sec w/o AD Gait speed = 3.14 ft/sec; >2.62 ft/sec indicates community ambulator FGA = 19/30; 19-24 = medium risk fall  Functional Gait  Assessment  Gait assessed  Yes   Gait Level Surface Walks 20 ft in less than 7 sec but greater than 5.5 sec, uses assistive device, slower speed, mild gait deviations, or deviates 6-10 in outside of the 12 in walkway width.   Change in Gait Speed Able to smoothly change walking speed without loss of balance or gait deviation. Deviate no more than 6 in outside of the 12 in walkway width.   Gait with Horizontal Head Turns Performs head turns smoothly with slight change in gait velocity (eg, minor disruption to smooth gait path), deviates 6-10 in outside 12 in walkway width, or uses an assistive device.   Gait with Vertical Head Turns Performs task with slight change in gait velocity (eg, minor disruption to smooth gait path), deviates 6 - 10 in outside 12 in walkway width or uses assistive device   Gait and Pivot Turn Pivot turns safely in greater than 3 sec and stops with no loss of balance, or pivot turns safely within 3 sec and stops  with mild imbalance, requires small steps to catch balance.   Step Over Obstacle Is able to step over one shoe box (4.5 in total height) without changing gait speed. No evidence of imbalance.   Gait with Narrow  Base of Support Ambulates 7-9 steps.   Gait with Eyes Closed Walks 20 ft, slow speed, abnormal gait pattern, evidence for imbalance, deviates 10-15 in outside 12 in walkway width. Requires more than 9 sec to ambulate 20 ft.   Ambulating Backwards Walks 20 ft, slow speed, abnormal gait pattern, evidence for imbalance, deviates 10-15 in outside 12 in walkway width.   Steps Alternating feet, must use rail.   Total Score 19   FGA comment: 19-24 = medium risk fall      THERAPEUTIC EXERCISE: To improve strength, endurance, and flexibility.  Demonstration, verbal and tactile cues throughout for technique.  Supine HS with strap x 30" bil Supine ITB stretch with strap x 30" bil Prone hip flexor & quad stretch with strap and towel under distal thigh x 30" bil Supine B quad set over towel roll 10 x 5" Hooklying B hip ADD isometric ball squeeze 10 x 5" - cues for TrA isometric Hoolkying TrA + SLR 5 x 3" bil Hoolkying TrA + SLR + hip ER 5 x 3" bil   03/19/2023 - Eval  SELF CARE:  Reviewed eval findings and role of PT in addressing identified deficits as well as instruction to improve safety with use of RW for assistive device.   THERAPEUTIC EXERCISE: To improve strength and flexibility.  Demonstration, verbal and tactile cues throughout for technique.  Rec Bike - L1 x 6 min Supine HS with strap x 30"   Supine ITB stretch with strap x 30" Prone hip flexor & quad stretch with strap x 30" Mod thomas hip flexor & quad stretch with strap x 30" Prone hip flexor & quad stretch with strap and towel under distal thigh x 30" - better stretch than previous versions Supine B quad set over towel roll 10 x 5" Hooklying B hip ADD isometric with towel roll 10 x 5", 2 sets    PATIENT EDUCATION:  Education  details: PT eval findings, HEP review, and HEP progression  Person educated: Patient Education method: Explanation, Demonstration, Verbal cues, Handouts, and MedBridgeGO app updated  Education comprehension: verbalized understanding, returned demonstration, verbal cues required, and needs further education  HOME EXERCISE PROGRAM: *Pt using MedBridgeGO app  Access Code: [email or web address] URL: [email or web address] Date: 04/09/2023 Prepared by: Verta Ellen  Exercises - Supine Hamstring Stretch with Strap  - 2 x daily - 7 x weekly - 3 reps - 30 sec hold - Supine Iliotibial Band Stretch with Strap  - 2 x daily - 7 x weekly - 3 reps - 30 sec hold - Prone Hip Flexor & Quad Stretch with Strap  - 2 x daily - 7 x weekly - 3 reps - 30 sec hold - Supine Hip Adduction Isometric with Ball  - 2 x daily - 7 x weekly - 2 sets - 10 reps - 5 sec hold - Active Straight Leg Raise with Quad Set  - 2 x daily - 7 x weekly - 2 sets - 10 reps - 3-5 sec hold - Straight Leg Raise with External Rotation  - 2 x daily - 7 x weekly - 1-2 sets - 10 reps - 3 sec hold - Clamshell with Resistance  - 1 x daily - 7 x weekly - 2 sets - 10 reps - Seated Abdominal Press into [employer]  - 1 x daily - 7 x weekly - 2 sets - 10 reps - 7 second hold   ASSESSMENT:  CLINICAL IMPRESSION: Pt is doing well overall. She request to review  some of her HEP, she was feeling more glute engagement with SLR + ER but we corrected this.  Her back hurts more today but it got better after exercises (went down to 4/10 from 8/10). She has met her STGs. She reports that she will get injections for her back soon based on pain and recent MRI results.  Nashya will benefit from continued skilled PT to address ongoing knee deficits to improve mobility and activity tolerance with decreased pain interference.   OBJECTIVE IMPAIRMENTS: Abnormal gait, decreased activity tolerance, decreased balance, decreased endurance, decreased knowledge of condition,  decreased knowledge of use of DME, decreased mobility, difficulty walking, decreased strength, hypomobility, increased fascial restrictions, impaired perceived functional ability, increased muscle spasms, impaired flexibility, impaired sensation, improper body mechanics, postural dysfunction, and pain.   ACTIVITY LIMITATIONS: carrying, lifting, bending, sitting, standing, squatting, sleeping, stairs, transfers, bed mobility, toileting, locomotion level, and caring for others  PARTICIPATION LIMITATIONS: meal prep, cleaning, laundry, driving, shopping, community activity, and walking her dog  PERSONAL FACTORS: Age, Fitness, Past/current experiences, Time since onset of injury/illness/exacerbation, and 3+ comorbidities: Osteoarthritis - multiple joints, anxiety, depression, GERD, headache, thyroid disease - hypothyroidism, urinary incontinence, SVT, lumbar DDD, chronic LBP, sciatica  are also affecting patient's functional outcome.   REHAB POTENTIAL: Good  CLINICAL DECISION MAKING: Unstable/unpredictable  EVALUATION COMPLEXITY: High   GOALS: Goals reviewed with patient? Yes  SHORT TERM GOALS: Target date:  04/16/2023  Patient will be independent with initial HEP. Baseline:  Goal status: MET- 04/23/23  2.  Patient will report at least 25% improvement in B knee pain to improve QOL. Baseline: 5/10 on eval, at worst 10/10  Goal status: MET- 04/23/23  LONG TERM GOALS: Target date: 05/14/2023  Patient will be independent with advanced/ongoing HEP to improve outcomes and carryover.  Baseline:  Goal status: IN PROGRESS  2.  Patient will report at least 50-75% improvement in B knee pain to improve QOL. Baseline: 5/10 currently, at worst 10/10  Goal status: IN PROGRESS  3.  Patient will demonstrate improved B LE strength to >/= 4+/5 for improved stability and ease of mobility. Baseline: Refer to above LE MMT table Goal status: IN PROGRESS  4.  Patient will be able to ambulate 600' with or w/o  LRAD and normal gait pattern without increased pain to access community.  Baseline: Antalgic gait with flexed trunk using RW Goal status: IN PROGRESS  5. Patient will be able to ascend/descend stairs with 1 HR and reciprocal step pattern safely to access home and community.  Baseline: NT, but pt reports severe pain with stair negotiation at home Goal status: IN PROGRESS  6.  Patient will report ability to squat sufficient to complete normal daily tasks w/o limitation due to pain or weakness. Baseline:  Goal status: IN PROGRESS   7.  Patient will report >/= 25/80 on LEFS (MCID = 9 pts) to demonstrate improved functional ability. Baseline: 12 / 80 = 15.0 % Goal status: IN PROGRESS  8.  Patient will demonstrate at least 19/30 on FGA to decrease risk of falls. Baseline: TBD Goal status: IN PROGRESS    PLAN:  PT FREQUENCY: 1-2x/week  PT DURATION: 8 weeks  PLANNED INTERVENTIONS: 97164- PT Re-evaluation, 97110-Therapeutic exercises, 97530- Therapeutic activity, 97112- Neuromuscular re-education, 97535- Self Care, 16109- Manual therapy, [phone]- Gait training, [phone]- Aquatic Therapy, [phone]- Electrical stimulation (unattended), [phone]- Electrical stimulation (manual), [id number]- Vasopneumatic device, [id number]- Ultrasound, [id number]- Ionotophoresis 4mg /ml Dexamethasone, Patient/Family education, Balance training, Stair training, Taping, Dry Needling, Joint mobilization, DME instructions, Cryotherapy,  Moist heat, and 04540- Physical Performance Test   PLAN FOR NEXT SESSION: progress quad and proximal LE strengthening - review/update HEP accordingly; start working on core engagement; possible trial of kinesiotaping for chondromalacia patella; new eval for low back pain when knee pain stabilized/resolved   Darleene Cleaver, PTA 04/23/2023, 11:48 AM

## 2023-04-24 MED ORDER — ROSUVASTATIN CALCIUM 10 MG PO TABS: 10.0000 mg | ORAL_TABLET | Freq: Every day | ORAL | 3 refills | Status: DC

## 2023-04-30 ENCOUNTER — Ambulatory Visit

## 2023-04-30 ENCOUNTER — Ambulatory Visit (HOSPITAL_BASED_OUTPATIENT_CLINIC_OR_DEPARTMENT_OTHER)
Admission: RE | Admit: 2023-04-30 | Discharge: 2023-04-30 | Disposition: A | Discharge status: Home / Self Care | Source: Ambulatory Visit | Attending: Family Medicine | Admitting: Family Medicine

## 2023-04-30 DIAGNOSIS — G8929 Other chronic pain: Secondary | ICD-10-CM | POA: Diagnosis not present

## 2023-04-30 DIAGNOSIS — R2689 Other abnormalities of gait and mobility: Secondary | ICD-10-CM | POA: Diagnosis not present

## 2023-04-30 DIAGNOSIS — E2839 Other primary ovarian failure: Secondary | ICD-10-CM | POA: Diagnosis not present

## 2023-04-30 DIAGNOSIS — M25561 Pain in right knee: Secondary | ICD-10-CM | POA: Diagnosis not present

## 2023-04-30 DIAGNOSIS — Z122 Encounter for screening for malignant neoplasm of respiratory organs: Secondary | ICD-10-CM | POA: Insufficient documentation

## 2023-04-30 DIAGNOSIS — M25562 Pain in left knee: Secondary | ICD-10-CM | POA: Diagnosis not present

## 2023-04-30 DIAGNOSIS — F17210 Nicotine dependence, cigarettes, uncomplicated: Secondary | ICD-10-CM | POA: Diagnosis not present

## 2023-04-30 DIAGNOSIS — M6281 Muscle weakness (generalized): Secondary | ICD-10-CM

## 2023-04-30 DIAGNOSIS — M85832 Other specified disorders of bone density and structure, left forearm: Secondary | ICD-10-CM | POA: Diagnosis not present

## 2023-04-30 IMAGING — MG MM DIGITAL SCREENING BILAT W/ TOMO AND CAD
8 series · 8 of 24 positions shown · non-contrast
Comparison: None.

CLINICAL DATA: Screening.

EXAM:
DIGITAL SCREENING BILATERAL MAMMOGRAM WITH TOMOSYNTHESIS AND CAD
TECHNIQUE: Bilateral screening digital craniocaudal and mediolateral oblique
mammograms were obtained. Bilateral screening digital breast
tomosynthesis was performed. The images were evaluated with
computer-aided detection.

[R CC synth-2D]
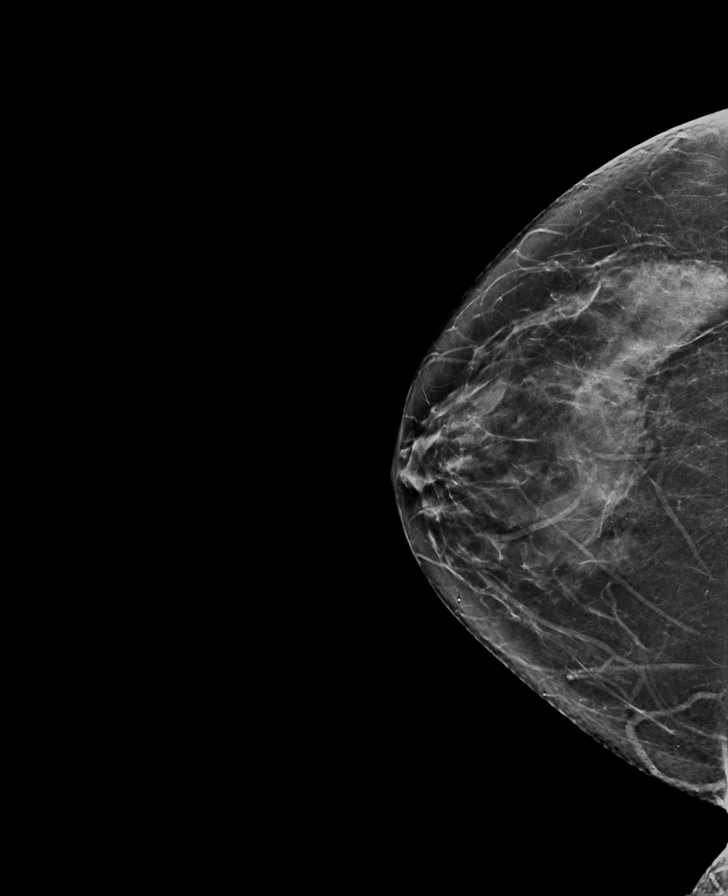

[R MLO synth-2D]
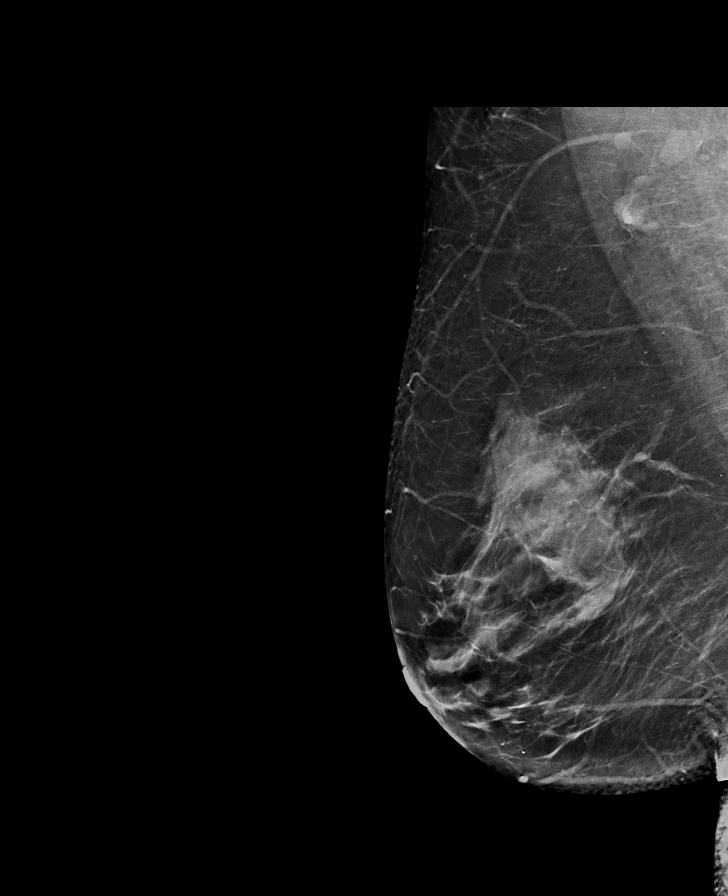

[L CC synth-2D]
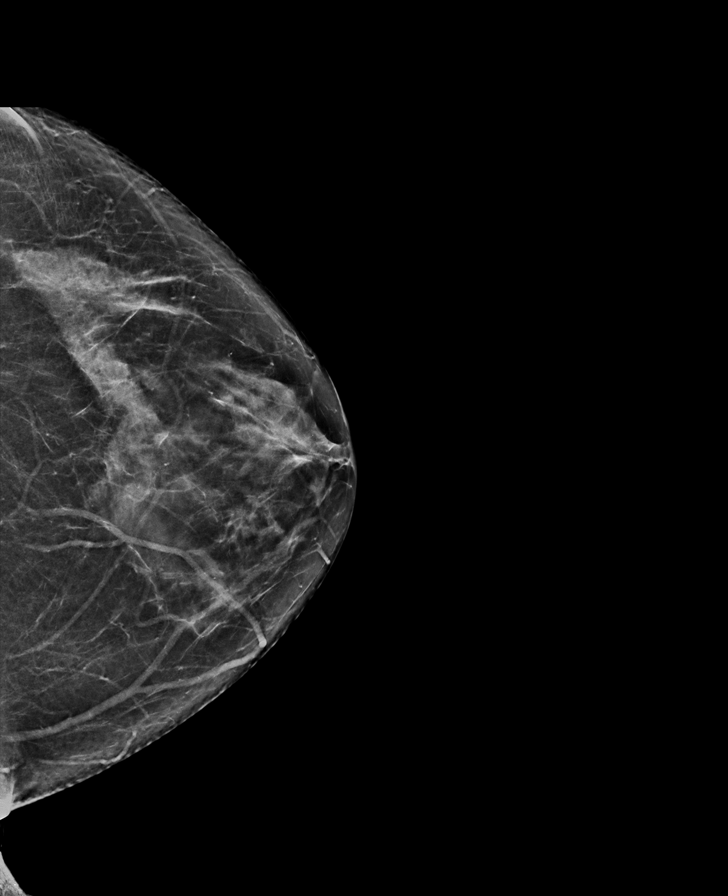

[L MLO synth-2D]
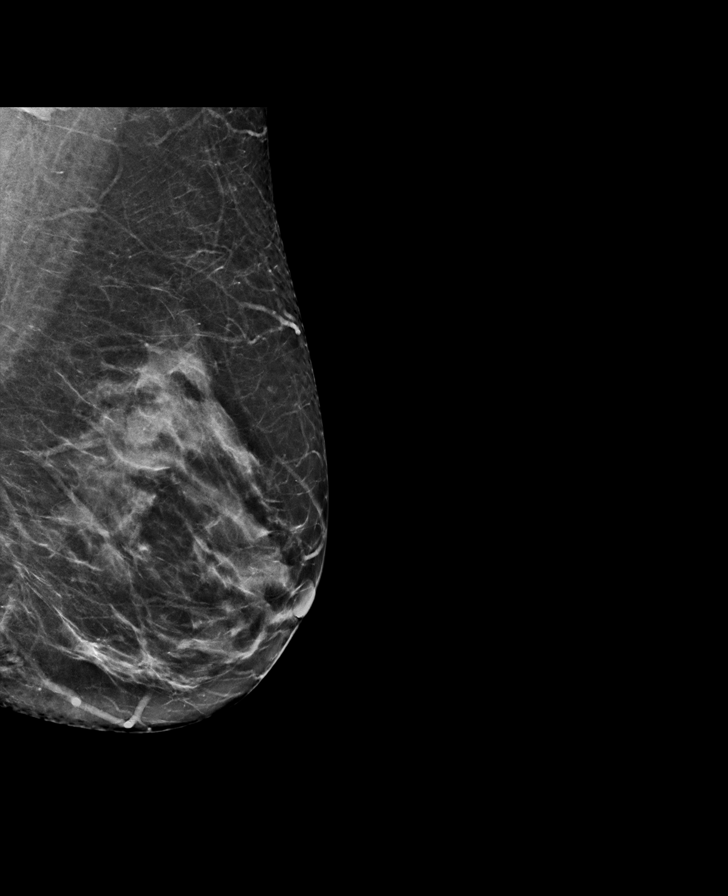

[L CC tomo · tomo slice 33/65.0]
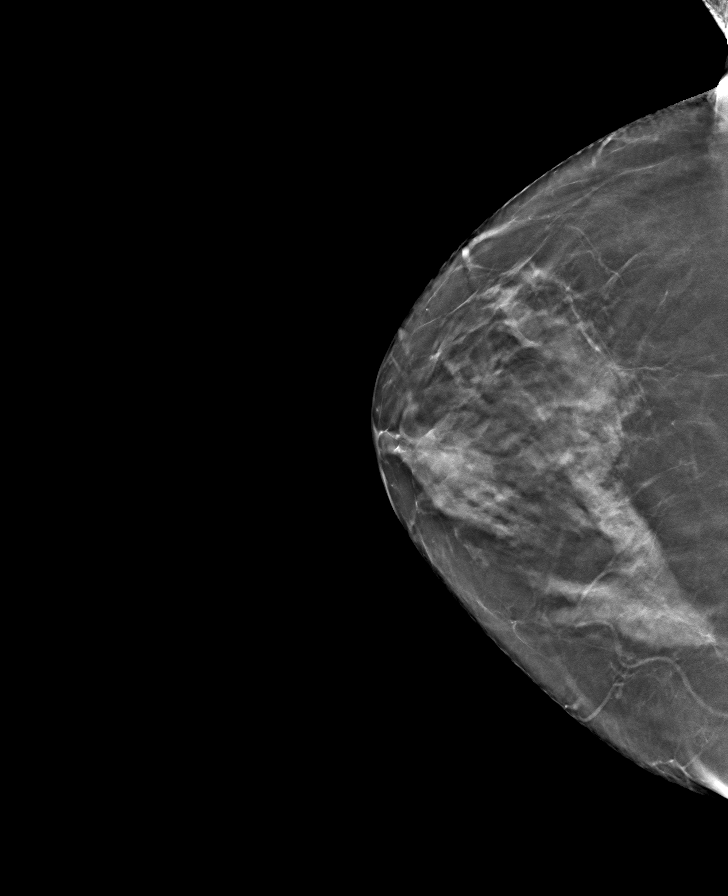

[R CC tomo · tomo slice 35/68.0]
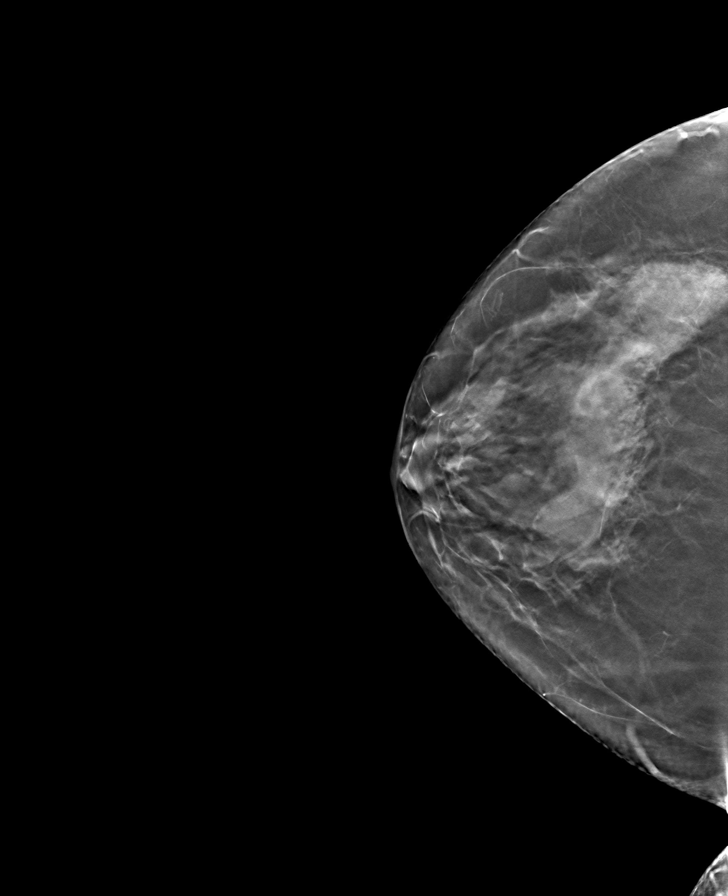

[L MLO tomo · tomo slice 35/70.0]
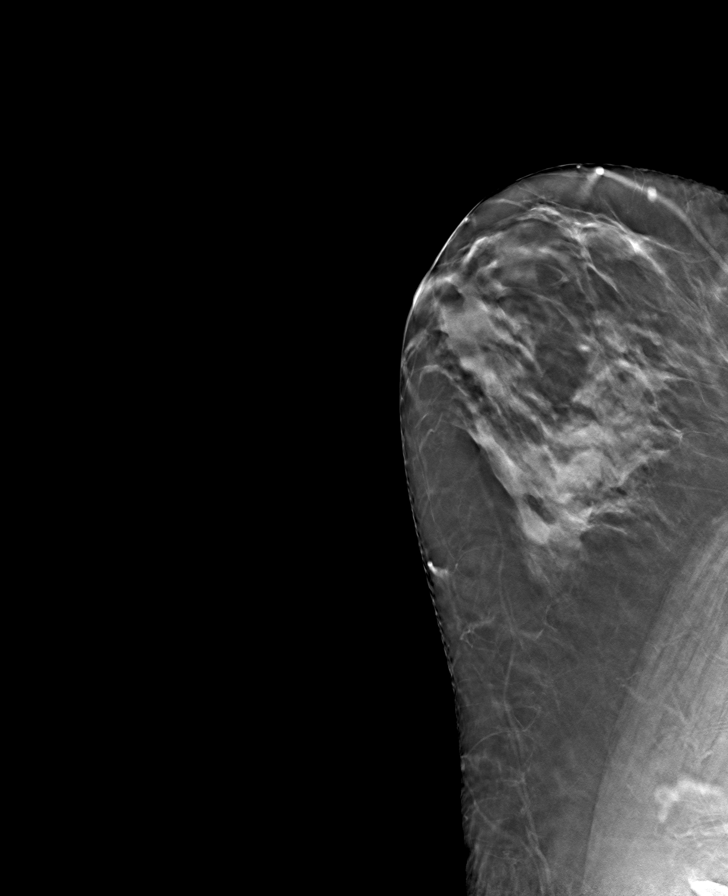

[R MLO tomo · tomo slice 41/81.0]
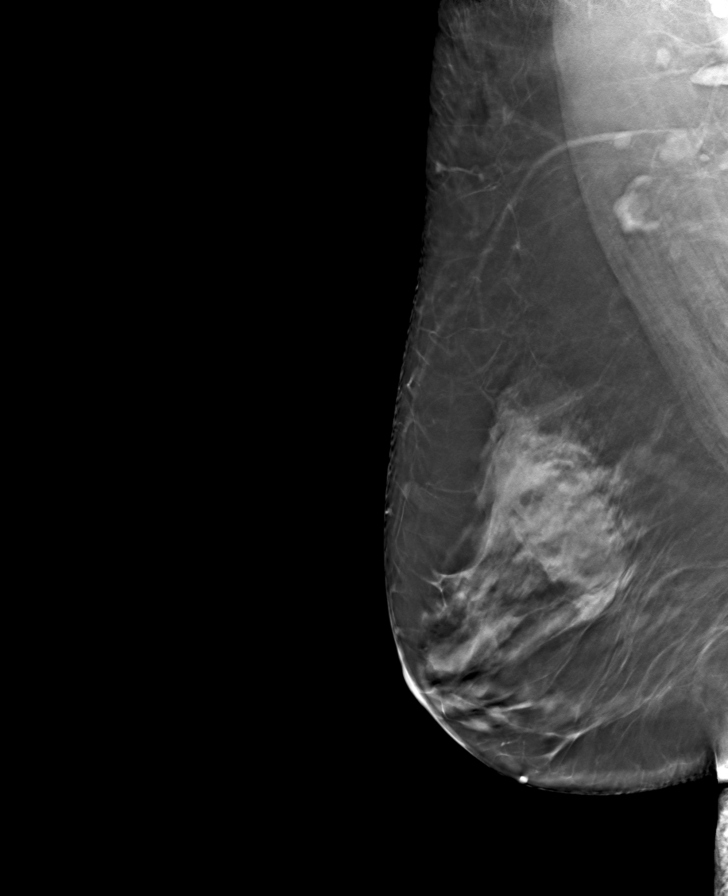

[8 of 24 positions shown; findings below may reference images not displayed]

ACR Breast Density Category c: The breast tissue is heterogeneously
dense, which may obscure small masses
FINDINGS: There are no findings suspicious for malignancy.
IMPRESSION: No mammographic evidence of malignancy. A result letter of this
screening mammogram will be mailed directly to the patient.

RECOMMENDATION:
Screening mammogram in one year. (Code:C8-T-HNK)

BI-RADS CATEGORY  1: Negative.

## 2023-04-30 NOTE — Therapy (Signed)
 OUTPATIENT PHYSICAL THERAPY TREATMENT   Patient Name: Amanda Maynard MRN: [id number] DOB:Jul 04, 1960, 63 y.o., female Today's Date: 04/30/2023   END OF SESSION:  PT End of Session - 04/30/23 1157     Visit Number 5    Date for PT Re-Evaluation 05/14/23    Authorization Type [employer]    PT Start Time 1110   pt late   PT Stop Time 1145    PT Time Calculation (min) 35 min    Activity Tolerance Patient tolerated treatment well    Behavior During Therapy [facility] for tasks assessed/performed                 Past Medical History:  Diagnosis Date   Allergy    Anxiety    Arthritis    Chicken pox    Depression    Diverticulitis    Emphysema of lung (HCC)    GERD (gastroesophageal reflux disease)    Headache    History of colon polyps    Jaundice    Lichen planopilaris    Thyroid disease    Urinary incontinence    Past Surgical History:  Procedure Laterality Date   ABDOMINAL HYSTERECTOMY  2012   APPENDECTOMY  2009   COLONOSCOPY  12/02/2016   Dr. Vista Mink. Moderate diverticulosis of the ascending colon, transverse colon, descending colon and sigmoid colon, increase in vascularity of the rectum. External hemorrhoids.   COLONOSCOPY  08/09/2013   Dr. Vista Mink. Moderate diverticulosis of the hepatic flexure, transverse colon, splenic flexure, descending colon and sigmoid colon. Polyp (7mm) in the proximal sigmoid colon. (polypectomy). External hemorrhoids.   ESOPHAGOGASTRODUODENOSCOPY  08/09/2013   Dr. Vista Mink. Normal stomach. Normal duodenum. Grade 1 esophagitis was seen in the lower third of the esophagus.   Patient Active Problem List   Diagnosis Date Noted   Supraventricular tachycardia (HCC) 12/13/2022   Palpitations 12/13/2022   Chondromalacia of both patellae 12/05/2022   Primary osteoarthritis of both hands 12/05/2022   Degeneration of intervertebral disc of lumbar region without discogenic back pain or lower extremity pain 12/05/2022   Arthritis of finger  of left hand 05/10/2022   Bilateral hand pain 05/06/2022   Migraine 04/10/2022   Anxiety and depression 02/04/2022   Chronic lower back pain 12/05/2021   Chest pain 10/10/2021   Hepatitis C antibody test positive 12/13/2020   Hypothyroidism (acquired) 12/13/2020   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 Knee strengthening.   THERAPY DIAG:  Chronic pain of both knees  Muscle weakness (generalized)  Other abnormalities of gait and mobility  RATIONALE FOR EVALUATION AND TREATMENT: Rehabilitation  ONSET DATE: Chronic, worsening with recent colder weather  NEXT MD VISIT:  None scheduled / PRN with referring provider;  03/19/23 with Juanda Chance, NP - [facility] for back pain   SUBJECTIVE:  SUBJECTIVE STATEMENT: "Back continues to hurt especially when trying to sleep." She is looking into back injections."  EVAL: Pt reports she has pain everywhere and is hoping to get on disability.  She is here for her B knee pain but reports her back pain is the worst - [facility] an appt [facility] to see someone for her back this afternoon.  Also has pain in most of her joints, esp hands.  She feels like her knee pain has been worse with the cold weather this winter.  She reports burning and popping in B knees with severe pain when climbing stairs.  She reports times where she wakes up with B LE numb, progressing to tingling/pin-and-needles.   PAIN: Are you having pain? Yes: NPRS scale: 3/10   Pain location: B anterior knee/peripatellar  Pain  description: grabbing, throbbing, popping, burning, soreness and aching depending on the activity  Aggravating factors: vacuuming, climbing up/down stairs, squatting, kneeling/quadruped, prolonged sitting   Relieving factors: ice, standing still, ibuprofen, Doterrra Deep Blue   Are you having pain? Yes: NPRS scale: 8/10  Pain location: midline low back & sacrum   PERTINENT HISTORY:  Osteoarthritis - multiple joints, anxiety, depression, GERD, headache, thyroid disease - hypothyroidism, urinary incontinence, SVT, lumbar DDD, chronic LBP, sciatica  PRECAUTIONS: Fall  RED FLAGS: Bowel or bladder incontinence: Yes: bladder issues since her hysterectomy  WEIGHT BEARING RESTRICTIONS: No  FALLS:  Has patient fallen in last 6 months? No, 2 falls in early 2024  LIVING ENVIRONMENT: Lives with: lives with their family (lives downstairs from mother) Lives in: House/apartment Stairs: Yes: Internal: 13 steps; on right going up and External: 5 steps; on right going up Has following equipment at home: Single point cane, Walker - 2 wheeled, shower chair, and pedal bike  OCCUPATION: Out of work since Dec 2021  PLOF: Independent and Leisure: walking the dog, washing dishes, light cooking, driving for her mother's appts, relatively sedentary   PATIENT GOALS: "To be able to get up and down from a squat and be able to crawl."   OBJECTIVE: (objective measures completed at initial evaluation unless otherwise dated)  DIAGNOSTIC FINDINGS:  10/16/22 - XR Left & Right knees: No medial or lateral compartment narrowing was noted.  Moderate patellofemoral narrowing was noted.   Impression: These findings are suggestive of moderate chondromalacia  patella of the knee.   10/16/22 - XR B hips:  No hip joint narrowing was noted.  No chondrocalcinosis was noted.   Osteoarthritic changes with mild sclerosis was noted in the SI joints.   Impression: Unremarkable x-rays of the hip joints.   PATIENT SURVEYS:   LEFS 12 / 80 = 15.0 %  COGNITION: Overall cognitive status: Within functional limits for tasks assessed    SENSATION: Intermittent B LE numbness and tingling    EDEMA:  Not normally an issue  POSTURE:  rounded shoulders, forward head, flexed trunk, but knee alignment relatively neutral  PALPATION: decreased patellar mobility L>R with poor VMO activation on quad set  MUSCLE LENGTH: Hamstrings: mod tight R>L ITB: mild/mod tight R>L Piriformis: mild/mod tight R>L Hip flexors: mild/mod tight R>L Quads: mod tight R>L Heelcord: mod tight B  LOWER EXTREMITY ROM:  Active ROM Right eval Left eval  Knee flexion 138 141  Knee extension -2 / 0* -2 / 0*  (Blank rows = not tested, * = supported knee extension)  LOWER EXTREMITY MMT:  MMT Right eval Left eval  Hip flexion 3+ 3+  Hip extension 4 4  Hip abduction 4- 4-  Hip adduction 4 4  Hip internal rotation 4- 4  Hip external rotation 4- 4-  Knee flexion 4 4  Knee extension 3+ 4-  Ankle dorsiflexion 4 4  Ankle plantarflexion 4- (7 SLS HR) 4- (6 SLS HR)  Ankle inversion    Ankle eversion     (Blank rows = not tested)  LOWER EXTREMITY SPECIAL TESTS:  Hip special tests: Ober's test: positive on R Knee special tests: Lateral pull sign: positive B and Patellafemoral grind test: positive B  FUNCTIONAL TESTS: (03/24/23) 5 times sit to stand: 20.03 sec; >15 sec indicates recurrent fall risk 10 meter walk test: 10.46 sec; Gait speed = 3.14 ft/sec, >2.62 ft/sec indicates community ambulator  Functional gait assessment: 19/30; 19-24 = medium risk fall   GAIT: Distance walked: clinic distances Assistive device utilized: Environmental consultant - 2 wheeled Level of assistance: Modified independence Gait pattern: lateral lean- Right and trunk flexed Comments: RW height adjusted 1 notch taller and cues provided for better upright posture and RW proximity   TODAY'S TREATMENT:  04/30/23 Therapeutic Exercise: to improve strength, ROM, and  flexibility  Bike L2x50min Open book in S/L reviewed LTR reviewed bil   Manual Therapy: to decrease muscle spasm, pain and improve mobility.  IASTM to TL junction, R glutes, lumbar paraspinals  04/23/23 Therapeutic Exercise: to improve strength, ROM, and flexibility  Nustep L5x58min UE/LE Standing hip abduction x 10  Standing hip extension x 10  Seated LAQ + ball squeeze x 10 bil Supine SLR with ER x 10- pt asked to reviewed S/L clamshell YTB 2 x 10 bil Bridges with TrA x 10 Supine LE raise and lower  04/09/23 Therapeutic Exercise: to improve strength, ROM, and flexibility  Recumbent Bike L2x34min Supine HS stretch with strap x 30" bil Supine ITB cross body stretch w/ strap x 30" bil Reviewed hip ADD- cues to avoid lifting hips off table Reviewed QS- good quad control SLR + QS x 10 bil SLR + ER + QS x 10 bil S/L clamshells YTB 10x3" bil S/L hip abduction x 5 bil Abdominal sets supine 10x7"  03/24/2023 PHYSICAL PERFORMANCE TEST or MEASUREMENT: 5xSTS = 20.03 sec; >15 sec indicates recurrent fall risk = 10.46 sec w/o AD Gait speed = 3.14 ft/sec; >2.62 ft/sec indicates community ambulator FGA = 19/30; 19-24 = medium risk fall  Functional Gait  Assessment  Gait assessed  Yes   Gait Level Surface Walks 20 ft in less than 7 sec but greater than 5.5 sec, uses assistive device, slower speed, mild gait deviations, or deviates 6-10 in outside of the 12 in walkway width.   Change in Gait Speed Able to smoothly change walking speed without loss of balance or gait deviation. Deviate no more than 6 in outside of the 12 in walkway width.   Gait with Horizontal Head Turns Performs head turns smoothly with slight change in gait velocity (eg, minor disruption to smooth gait path), deviates 6-10 in outside 12 in walkway width, or uses an assistive device.   Gait with Vertical Head Turns Performs task with slight change in gait velocity (eg, minor disruption to smooth gait path), deviates 6 - 10  in outside 12 in walkway width or uses assistive device   Gait and Pivot Turn Pivot turns safely in greater than 3 sec and stops with no loss of balance, or pivot turns safely within 3 sec and stops with mild imbalance, requires small steps to catch balance.   Step Over  Obstacle Is able to step over one shoe box (4.5 in total height) without changing gait speed. No evidence of imbalance.   Gait with Narrow Base of Support Ambulates 7-9 steps.   Gait with Eyes Closed Walks 20 ft, slow speed, abnormal gait pattern, evidence for imbalance, deviates 10-15 in outside 12 in walkway width. Requires more than 9 sec to ambulate 20 ft.   Ambulating Backwards Walks 20 ft, slow speed, abnormal gait pattern, evidence for imbalance, deviates 10-15 in outside 12 in walkway width.   Steps Alternating feet, must use rail.   Total Score 19   FGA comment: 19-24 = medium risk fall      THERAPEUTIC EXERCISE: To improve strength, endurance, and flexibility.  Demonstration, verbal and tactile cues throughout for technique.  Supine HS with strap x 30" bil Supine ITB stretch with strap x 30" bil Prone hip flexor & quad stretch with strap and towel under distal thigh x 30" bil Supine B quad set over towel roll 10 x 5" Hooklying B hip ADD isometric ball squeeze 10 x 5" - cues for TrA isometric Hoolkying TrA + SLR 5 x 3" bil Hoolkying TrA + SLR + hip ER 5 x 3" bil   03/19/2023 - Eval  SELF CARE:  Reviewed eval findings and role of PT in addressing identified deficits as well as instruction to improve safety with use of RW for assistive device.   THERAPEUTIC EXERCISE: To improve strength and flexibility.  Demonstration, verbal and tactile cues throughout for technique.  Rec Bike - L1 x 6 min Supine HS with strap x 30"   Supine ITB stretch with strap x 30" Prone hip flexor & quad stretch with strap x 30" Mod thomas hip flexor & quad stretch with strap x 30" Prone hip flexor & quad stretch with strap and towel under  distal thigh x 30" - better stretch than previous versions Supine B quad set over towel roll 10 x 5" Hooklying B hip ADD isometric with towel roll 10 x 5", 2 sets    PATIENT EDUCATION:  Education details: PT eval findings, HEP review, and HEP progression  Person educated: Patient Education method: Explanation, Demonstration, Verbal cues, Handouts, and MedBridgeGO app updated  Education comprehension: verbalized understanding, returned demonstration, verbal cues required, and needs further education  HOME EXERCISE PROGRAM: *Pt using MedBridgeGO app  Access Code: [email or web address] URL: [email or web address] Date: 04/09/2023 Prepared by: Verta Ellen  Exercises - Supine Hamstring Stretch with Strap  - 2 x daily - 7 x weekly - 3 reps - 30 sec hold - Supine Iliotibial Band Stretch with Strap  - 2 x daily - 7 x weekly - 3 reps - 30 sec hold - Prone Hip Flexor & Quad Stretch with Strap  - 2 x daily - 7 x weekly - 3 reps - 30 sec hold - Supine Hip Adduction Isometric with Ball  - 2 x daily - 7 x weekly - 2 sets - 10 reps - 5 sec hold - Active Straight Leg Raise with Quad Set  - 2 x daily - 7 x weekly - 2 sets - 10 reps - 3-5 sec hold - Straight Leg Raise with External Rotation  - 2 x daily - 7 x weekly - 1-2 sets - 10 reps - 3 sec hold - Clamshell with Resistance  - 1 x daily - 7 x weekly - 2 sets - 10 reps - Seated Abdominal Press into [employer]  - 1 x daily - 7 x  weekly - 2 sets - 10 reps - 7 second hold   ASSESSMENT:  CLINICAL IMPRESSION: Pt still reports more LBP today than knee pain. We worked on trying to decrease her LBP to assist with overall mobility and gait, to perform more LE strengthening. Pt arrived late so we were limited on time. Will hopefully continue rehab for B knee pain next visit. She reports less pain in B knees but still reporting pain with a 1/4 squat. Ori will benefit from continued skilled PT to address ongoing knee deficits to improve mobility and  activity tolerance with decreased pain interference.   OBJECTIVE IMPAIRMENTS: Abnormal gait, decreased activity tolerance, decreased balance, decreased endurance, decreased knowledge of condition, decreased knowledge of use of DME, decreased mobility, difficulty walking, decreased strength, hypomobility, increased fascial restrictions, impaired perceived functional ability, increased muscle spasms, impaired flexibility, impaired sensation, improper body mechanics, postural dysfunction, and pain.   ACTIVITY LIMITATIONS: carrying, lifting, bending, sitting, standing, squatting, sleeping, stairs, transfers, bed mobility, toileting, locomotion level, and caring for others  PARTICIPATION LIMITATIONS: meal prep, cleaning, laundry, driving, shopping, community activity, and walking her dog  PERSONAL FACTORS: Age, Fitness, Past/current experiences, Time since onset of injury/illness/exacerbation, and 3+ comorbidities: Osteoarthritis - multiple joints, anxiety, depression, GERD, headache, thyroid disease - hypothyroidism, urinary incontinence, SVT, lumbar DDD, chronic LBP, sciatica  are also affecting patient's functional outcome.   REHAB POTENTIAL: Good  CLINICAL DECISION MAKING: Unstable/unpredictable  EVALUATION COMPLEXITY: High   GOALS: Goals reviewed with patient? Yes  SHORT TERM GOALS: Target date:  04/16/2023  Patient will be independent with initial HEP. Baseline:  Goal status: MET- 04/23/23  2.  Patient will report at least 25% improvement in B knee pain to improve QOL. Baseline: 5/10 on eval, at worst 10/10  Goal status: MET- 04/23/23  LONG TERM GOALS: Target date: 05/14/2023  Patient will be independent with advanced/ongoing HEP to improve outcomes and carryover.  Baseline:  Goal status: IN PROGRESS  2.  Patient will report at least 50-75% improvement in B knee pain to improve QOL. Baseline: 5/10 currently, at worst 10/10  Goal status: IN PROGRESS- 04/30/23 3/10 now in B knees  3.   Patient will demonstrate improved B LE strength to >/= 4+/5 for improved stability and ease of mobility. Baseline: Refer to above LE MMT table Goal status: IN PROGRESS  4.  Patient will be able to ambulate 600' with or w/o LRAD and normal gait pattern without increased pain to access community.  Baseline: Antalgic gait with flexed trunk using RW Goal status: IN PROGRESS  5. Patient will be able to ascend/descend stairs with 1 HR and reciprocal step pattern safely to access home and community.  Baseline: NT, but pt reports severe pain with stair negotiation at home Goal status: IN PROGRESS  6.  Patient will report ability to squat sufficient to complete normal daily tasks w/o limitation due to pain or weakness. Baseline:  Goal status: IN PROGRESS - 04/30/23 pain with 1/4 of a squat bil  7.  Patient will report >/= 25/80 on LEFS (MCID = 9 pts) to demonstrate improved functional ability. Baseline: 12 / 80 = 15.0 % Goal status: IN PROGRESS  8.  Patient will demonstrate at least 19/30 on FGA to decrease risk of falls. Baseline: TBD Goal status: IN PROGRESS    PLAN:  PT FREQUENCY: 1-2x/week  PT DURATION: 8 weeks  PLANNED INTERVENTIONS: 97164- PT Re-evaluation, 97110-Therapeutic exercises, 97530- Therapeutic activity, [id number]- Neuromuscular re-education, 97535- Self Care, 16109- Manual therapy, [phone]- Gait training,  16109- Aquatic Therapy, [id number]- Electrical stimulation (unattended), [phone]- Electrical stimulation (manual), 09811- Vasopneumatic device, [id number]- Ultrasound, [id number]- Ionotophoresis 4mg /ml Dexamethasone, Patient/Family education, Balance training, Stair training, Taping, Dry Needling, Joint mobilization, DME instructions, Cryotherapy, Moist heat, and 97750- Physical Performance Test   PLAN FOR NEXT SESSION: progress quad and proximal LE strengthening - review/update HEP accordingly; start working on core engagement; possible trial of kinesiotaping for chondromalacia patella; new eval  for low back pain when knee pain stabilized/resolved   Darleene Cleaver, PTA 04/30/2023, 12:13 PM

## 2023-05-01 ENCOUNTER — Encounter: Payer: Self-pay | Admitting: Family Medicine

## 2023-05-01 DIAGNOSIS — M8580 Other specified disorders of bone density and structure, unspecified site: Secondary | ICD-10-CM | POA: Insufficient documentation

## 2023-05-07 ENCOUNTER — Other Ambulatory Visit (HOSPITAL_BASED_OUTPATIENT_CLINIC_OR_DEPARTMENT_OTHER): Payer: Self-pay

## 2023-05-07 ENCOUNTER — Ambulatory Visit: Admitting: Physical Therapy

## 2023-05-07 MED ORDER — ROSUVASTATIN CALCIUM 10 MG PO TABS
10.0000 mg | ORAL_TABLET | Freq: Every day | ORAL | 3 refills | Status: AC
  Filled 2023-05-07: qty 30, 30d supply, fill #0
  Filled 2023-06-08: qty 30, 30d supply, fill #1
  Filled 2023-07-15: qty 30, 30d supply, fill #2
  Filled 2023-08-17: qty 30, 30d supply, fill #3
  Filled 2023-09-11: qty 30, 30d supply, fill #4
  Filled 2023-10-15: qty 30, 30d supply, fill #5
  Filled 2023-11-21 (×2): qty 30, 30d supply, fill #6
  Filled 2023-12-22: qty 30, 30d supply, fill #7
  Filled 2024-01-26: qty 30, 30d supply, fill #8
  Filled 2024-02-20: qty 30, 30d supply, fill #9

## 2023-05-07 NOTE — Addendum Note (Signed)
 Addended by: Abbe Amsterdam C on: 05/07/2023 05:28 PM   Modules accepted: Orders

## 2023-05-08 ENCOUNTER — Other Ambulatory Visit: Payer: Self-pay

## 2023-05-08 ENCOUNTER — Ambulatory Visit: Admitting: Physical Medicine and Rehabilitation

## 2023-05-08 VITALS — BP 126/84 | HR 79

## 2023-05-08 DIAGNOSIS — M5416 Radiculopathy, lumbar region: Secondary | ICD-10-CM | POA: Diagnosis not present

## 2023-05-08 DIAGNOSIS — M4316 Spondylolisthesis, lumbar region: Secondary | ICD-10-CM

## 2023-05-08 MED ORDER — METHYLPREDNISOLONE ACETATE 40 MG/ML IJ SUSP
40.0000 mg | Freq: Once | INTRAMUSCULAR | Status: AC
  Administered 2023-05-08: 40 mg

## 2023-05-08 NOTE — Procedures (Signed)
 Lumbar Epidural Steroid Injection - Interlaminar Approach with Fluoroscopic Guidance  Patient: Amanda Maynard      Date of Birth: [birth year] MRN: [id number] PCP: Pearline Cables, MD      Visit Date: 05/08/2023   Universal Protocol:     Consent Given By: the patient  Position: PRONE  Additional Comments: Vital signs were monitored before and after the procedure. Patient was prepped and draped in the usual sterile fashion. The correct patient, procedure, and site was verified.   Injection Procedure Details:   Procedure diagnoses: Radiculopathy, lumbar region [M54.16]   Meds Administered:  Meds ordered this encounter  Medications   methylPREDNISolone acetate (DEPO-MEDROL) injection 40 mg     Laterality: Right  Location/Site:  L4-5  Needle: 3.5 in., 20 ga. Tuohy  Needle Placement: Paramedian epidural  Findings:   -Comments: Excellent flow of contrast into the epidural space.  Procedure Details: Using a paramedian approach from the side mentioned above, the region overlying the inferior lamina was localized under fluoroscopic visualization and the soft tissues overlying this structure were infiltrated with 4 ml. of 1% Lidocaine without Epinephrine. The Tuohy needle was inserted into the epidural space using a paramedian approach.   The epidural space was localized using loss of resistance along with counter oblique bi-planar fluoroscopic views.  After negative aspirate for air, blood, and CSF, a 2 ml. volume of Isovue-250 was injected into the epidural space and the flow of contrast was observed. Radiographs were obtained for documentation purposes.    The injectate was administered into the level noted above.   Additional Comments:  The patient tolerated the procedure well Dressing: 2 x 2 sterile gauze and Band-Aid    Post-procedure details: Patient was observed during the procedure. Post-procedure instructions were reviewed.  Patient left the clinic in  stable condition.

## 2023-05-08 NOTE — Progress Notes (Signed)
 Pain Scale   Average Pain 8        +Driver, -BT, -Dye Allergies.

## 2023-05-08 NOTE — Progress Notes (Signed)
 Amanda Maynard - 63 y.o. female MRN [id number]  Date of birth: 1960/11/24  Office Visit Note: Visit Date: 05/08/2023 PCP: Pearline Cables, MD Referred by: Pearline Cables, MD  Subjective: Chief Complaint  Patient presents with   Lower Back - Pain   HPI:  Amanda Maynard is a 63 y.o. female who comes in today at the request of Ellin Goodie, FNP for planned Right L4-5 Lumbar Interlaminar epidural steroid injection with fluoroscopic guidance.  The patient has failed conservative care including home exercise, medications, time and activity modification.  This injection will be diagnostic and hopefully therapeutic.  Please see requesting physician notes for further details and justification.   ROS Otherwise per HPI.  Assessment & Plan: Visit Diagnoses:    ICD-10-CM   1. Radiculopathy, lumbar region  M54.16 XR C-ARM NO REPORT    Epidural Steroid injection    methylPREDNISolone acetate (DEPO-MEDROL) injection 40 mg    2. Anterolisthesis of lumbar spine  M43.16 XR C-ARM NO REPORT    Epidural Steroid injection    methylPREDNISolone acetate (DEPO-MEDROL) injection 40 mg      Plan: No additional findings.   Meds & Orders:  Meds ordered this encounter  Medications   methylPREDNISolone acetate (DEPO-MEDROL) injection 40 mg    Orders Placed This Encounter  Procedures   XR C-ARM NO REPORT   Epidural Steroid injection    Follow-up: Return for visit to requesting provider as needed.   Procedures: No procedures performed  Lumbar Epidural Steroid Injection - Interlaminar Approach with Fluoroscopic Guidance  Patient: Amanda Maynard      Date of Birth: 07/13/1960 MRN: [id number] PCP: Pearline Cables, MD      Visit Date: 05/08/2023   Universal Protocol:     Consent Given By: the patient  Position: PRONE  Additional Comments: Vital signs were monitored before and after the procedure. Patient was prepped and draped in the usual sterile fashion. The correct  patient, procedure, and site was verified.   Injection Procedure Details:   Procedure diagnoses: Radiculopathy, lumbar region [M54.16]   Meds Administered:  Meds ordered this encounter  Medications   methylPREDNISolone acetate (DEPO-MEDROL) injection 40 mg     Laterality: Right  Location/Site:  L4-5  Needle: 3.5 in., 20 ga. Tuohy  Needle Placement: Paramedian epidural  Findings:   -Comments: Excellent flow of contrast into the epidural space.  Procedure Details: Using a paramedian approach from the side mentioned above, the region overlying the inferior lamina was localized under fluoroscopic visualization and the soft tissues overlying this structure were infiltrated with 4 ml. of 1% Lidocaine without Epinephrine. The Tuohy needle was inserted into the epidural space using a paramedian approach.   The epidural space was localized using loss of resistance along with counter oblique bi-planar fluoroscopic views.  After negative aspirate for air, blood, and CSF, a 2 ml. volume of Isovue-250 was injected into the epidural space and the flow of contrast was observed. Radiographs were obtained for documentation purposes.    The injectate was administered into the level noted above.   Additional Comments:  The patient tolerated the procedure well Dressing: 2 x 2 sterile gauze and Band-Aid    Post-procedure details: Patient was observed during the procedure. Post-procedure instructions were reviewed.  Patient left the clinic in stable condition.   Clinical History: CLINICAL DATA:  62 year old female with persistent low back pain. Bilateral buttock pain.   EXAM: MRI LUMBAR SPINE WITHOUT CONTRAST   TECHNIQUE: Multiplanar, multisequence MR  imaging of the lumbar spine was performed. No intravenous contrast was administered.   COMPARISON:  Lumbar radiographs 04/18/2021.   FINDINGS: Segmentation:  Normal on the comparison.   Alignment: Lumbar lordosis has not  significantly changed since 2023 except chronic grade 1 anterolisthesis of L4 on L5 does appear mildly progressed, now 4-5 mm.   Vertebrae: Normal background bone marrow signal. Maintained vertebral body height. Sclerotic and benign appearing marrow signal area in the central L4 body (series 105, image 9), probably a benign fibrous lesion. Incidental superimposed L5 vertebral body hemangiomas (normal variant). Occasional other lumbar and lower thoracic vertebral hemangiomas (T11, normal variant).   There is superimposed degenerative appearing patchy marrow edema at the T10-T11 anterior endplates (series 161, image 8).   But no other marrow edema or evidence of acute osseous abnormality. Intact visible sacrum and SI joints.   Conus medullaris and cauda equina: Conus extends to the L1 level. No lower spinal cord or conus signal abnormality. Normal cauda equina nerve roots.   Paraspinal and other soft tissues: Benign appearing left renal midpole cyst (no follow-up imaging recommended). Negative other Visualized abdominal viscera and paraspinal soft tissues.   Disc levels:   T10-T11: Subtle anterolisthesis with disc bulging. Moderate facet hypertrophy. No spinal stenosis. Mild if any right T10 foraminal stenosis.   T11-T12: Negative.   T12-L1:  Negative.   L1-L2:  Negative.   L2-L3: Mild disc desiccation and circumferential disc bulge. Subtle retrolisthesis at this level. Mild to moderate facet hypertrophy, trace degenerative facet joint fluid. No stenosis.   L3-L4: Minimal disc bulging. Moderate facet and ligament flavum hypertrophy, greater on the right, with trace degenerative facet joint fluid. No stenosis.   L4-L5: Grade 1 anterolisthesis. Disc space loss. Circumferential disc/pseudo disc. Moderate ligament flavum and moderate to severe bilateral facet hypertrophy. No spinal or convincing lateral recess stenosis. Up to mild bilateral L4 foraminal stenosis.   L5-S1:  Mild central and left subarticular posterior disc bulging or protrusions (series 113, image 36), with moderate facet hypertrophy. But capacious spinal canal. No stenosis at this level.   IMPRESSION: 1. Chronic grade 1 anterolisthesis of L4 on L5 appears mildly progressed since 2023 radiographs, with mild disc but moderate to severe facet arthropathy. Up to mild bilateral L4 foraminal stenosis. No spinal stenosis. 2. Generally mild for age lumbar spine degeneration elsewhere and no convincing neural impingement. Although up to moderate facet hypertrophy elsewhere, subtle retrolisthesis at L2-L3. 3. Mild acute on chronic lower thoracic endplate degeneration at T10-T11. Subtle anterolisthesis there.     Electronically Signed   By: Odessa Fleming M.D.   On: 04/17/2023 12:46     Objective:  VS:  HT:    WT:   BMI:     BP:126/84  HR:79bpm  TEMP: ( )  RESP:  Physical Exam Vitals and nursing note reviewed.  Constitutional:      General: She is not in acute distress.    Appearance: Normal appearance. She is not ill-appearing.  HENT:     Head: Normocephalic and atraumatic.     Right Ear: External ear normal.     Left Ear: External ear normal.  Eyes:     Extraocular Movements: Extraocular movements intact.  Cardiovascular:     Rate and Rhythm: Normal rate.     Pulses: Normal pulses.  Pulmonary:     Effort: Pulmonary effort is normal. No respiratory distress.  Abdominal:     General: There is no distension.     Palpations: Abdomen is soft.  Musculoskeletal:        General: Tenderness present.     Cervical back: Neck supple.     Right lower leg: No edema.     Left lower leg: No edema.     Comments: Patient has good distal strength with no pain over the greater trochanters.  No clonus or focal weakness.  Skin:    Findings: No erythema, lesion or rash.  Neurological:     General: No focal deficit present.     Mental Status: She is alert and oriented to person, place, and time.      Sensory: No sensory deficit.     Motor: No weakness or abnormal muscle tone.     Coordination: Coordination normal.  Psychiatric:        Mood and Affect: Mood normal.        Behavior: Behavior normal.      Imaging: No results found.

## 2023-05-08 NOTE — Patient Instructions (Signed)

## 2023-05-14 ENCOUNTER — Encounter: Payer: Self-pay | Admitting: Physical Therapy

## 2023-05-14 ENCOUNTER — Ambulatory Visit: Attending: Physical Medicine and Rehabilitation | Admitting: Physical Therapy

## 2023-05-14 DIAGNOSIS — G8929 Other chronic pain: Secondary | ICD-10-CM | POA: Diagnosis present

## 2023-05-14 DIAGNOSIS — M6281 Muscle weakness (generalized): Secondary | ICD-10-CM | POA: Diagnosis present

## 2023-05-14 DIAGNOSIS — R2689 Other abnormalities of gait and mobility: Secondary | ICD-10-CM | POA: Insufficient documentation

## 2023-05-14 DIAGNOSIS — M5459 Other low back pain: Secondary | ICD-10-CM | POA: Diagnosis present

## 2023-05-14 DIAGNOSIS — M25561 Pain in right knee: Secondary | ICD-10-CM | POA: Insufficient documentation

## 2023-05-14 DIAGNOSIS — M62830 Muscle spasm of back: Secondary | ICD-10-CM | POA: Diagnosis present

## 2023-05-14 DIAGNOSIS — M25562 Pain in left knee: Secondary | ICD-10-CM | POA: Diagnosis present

## 2023-05-14 NOTE — Therapy (Signed)
 OUTPATIENT PHYSICAL THERAPY TREATMENT / RE-EVALUATION & RECERTIFICATION  Progress Note Reporting Period 03/19/2023 to 05/14/2023   See note below for Objective Data and Assessment of Progress/Goals.     Patient Name: Amanda Maynard MRN: [id number] DOB:October 15, 1960, 63 y.o., female Today's Date: 05/14/2023   END OF SESSION:  PT End of Session - 05/14/23 1018     Visit Number 6    Date for PT Re-Evaluation 07/09/23    Authorization Type [employer]    PT Start Time 1018    PT Stop Time 1112    PT Time Calculation (min) 54 min    Activity Tolerance Patient tolerated treatment well    Behavior During Therapy [facility] for tasks assessed/performed                  Past Medical History:  Diagnosis Date   Allergy    Anxiety    Arthritis    Chicken pox    Depression    Diverticulitis    Emphysema of lung (HCC)    GERD (gastroesophageal reflux disease)    Headache    History of colon polyps    Jaundice    Lichen planopilaris    Thyroid disease    Urinary incontinence    Past Surgical History:  Procedure Laterality Date   ABDOMINAL HYSTERECTOMY  2012   APPENDECTOMY  2009   COLONOSCOPY  12/02/2016   Dr. Vista Mink. Moderate diverticulosis of the ascending colon, transverse colon, descending colon and sigmoid colon, increase in vascularity of the rectum. External hemorrhoids.   COLONOSCOPY  08/09/2013   Dr. Vista Mink. Moderate diverticulosis of the hepatic flexure, transverse colon, splenic flexure, descending colon and sigmoid colon. Polyp (7mm) in the proximal sigmoid colon. (polypectomy). External hemorrhoids.   ESOPHAGOGASTRODUODENOSCOPY  08/09/2013   Dr. Vista Mink. Normal stomach. Normal duodenum. Grade 1 esophagitis was seen in the lower third of the esophagus.   Patient Active Problem List   Diagnosis Date Noted   Osteopenia 05/01/2023   Supraventricular tachycardia (HCC) 12/13/2022   Palpitations 12/13/2022   Chondromalacia of both patellae 12/05/2022   Primary  osteoarthritis of both hands 12/05/2022   Degeneration of intervertebral disc of lumbar region without discogenic back pain or lower extremity pain 12/05/2022   Arthritis of finger of left hand 05/10/2022   Bilateral hand pain 05/06/2022   Migraine 04/10/2022   Anxiety and depression 02/04/2022   Chronic lower back pain 12/05/2021   Chest pain 10/10/2021   Hepatitis C antibody test positive 12/13/2020   Hypothyroidism (acquired) 12/13/2020   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   Arthritis 02/05/2020   Sciatica 02/05/2020    PCP: Pearline Cables, MD   REFERRING PROVIDER: Juanda Chance, NP (back); Tarry Kos, MD (knees)  REFERRING DIAG:  M54.16 (ICD-10-CM) - Lumbar radiculopathy  M54.42,M54.41,G89.29 (ICD-10-CM) - Chronic bilateral low back pain with bilateral sciatica  G89.4 (ICD-10-CM) - Chronic pain syndrome  M25.561,M25.562,G89.29 (ICD-10-CM) - Chronic pain of both knee   THERAPY DIAG:  Other low back pain  Muscle spasm of back  Muscle weakness (generalized)  Other abnormalities of gait and mobility  Chronic pain of both knees  RATIONALE FOR EVALUATION AND TREATMENT: Rehabilitation  ONSET DATE: Chronic for knee and back  NEXT MD VISIT:  None scheduled / PRN with both referring providers    SUBJECTIVE:  SUBJECTIVE STATEMENT: Pt reports Dr. Alvester Morin gave her a lumbar ESI on the R last Thursday which has given her some relief (sleeping up to 6 hrs in a row which she states "is a miracle for me" and has needed fewer and shorter naps).  Prior to [facility] she was having numbness and tingling down R LE but none since on R, although still has less intense numbness in the L LE if she sits for too long.  She feels like the ESI has left her lopsided with feeling like she is more prone  to lose her balance.  She states her exercises really have helped her knees.  EVAL (for knees): Pt reports she has pain everywhere and is hoping to get on disability.  She is here for her B knee pain but reports her back pain is the worst - has an appt at [facility] to [facility] someone for her back this afternoon.  Also has pain in most of her joints, esp hands.  She feels like her knee pain has been worse with the cold weather this winter.  She reports burning and popping in B knees with severe pain when climbing stairs.  She reports times where she wakes up with B LE numb, progressing to tingling/pin-and-needles.   PAIN:  Are you having pain? Yes: NPRS scale: 7/10  Pain location: L low back, B thoracic & neck Pain description: tightness and burning (feels muscular)  Aggravating factors: vacuuming, walking the dog  Relieving factors: hooklying figure-4 LTR, flexion stretch     Are you having pain? Yes: NPRS scale: 1/10   Pain location: L anterior knee/peripatellar  Pain description: grabbing, throbbing, popping, burning, soreness and aching depending on the activity  Aggravating factors: only when knee pops   Relieving factors: ice, standing still, ibuprofen, Doterrra Deep Blue    PERTINENT HISTORY:  Osteoarthritis - multiple joints, anxiety, depression, GERD, headache, thyroid disease - hypothyroidism, urinary incontinence, SVT, lumbar DDD, chronic LBP, sciatica  PRECAUTIONS: Fall  RED FLAGS: Bowel or bladder incontinence: Yes: bladder issues since her hysterectomy  WEIGHT BEARING RESTRICTIONS: No  FALLS:  Has patient fallen in last 6 months? No, 2 falls in early 2024  LIVING ENVIRONMENT: Lives with: lives with their family (lives downstairs from mother) Lives in: House/apartment Stairs: Yes: Internal: 13 steps; on right going up and External: 5 steps; on right going up Has following equipment at home: Single point cane, Walker - 2 wheeled, shower chair, and pedal bike  OCCUPATION:  Out of work since Dec 2021  PLOF: Independent and Leisure: walking the dog, washing dishes, light cooking, driving for her mother's appts, relatively sedentary   PATIENT GOALS: "To be able to get up and down from a squat and be able to crawl."   OBJECTIVE: (objective measures completed at initial evaluation unless otherwise dated)  DIAGNOSTIC FINDINGS:  04/05/23 - MR lumbar spine: IMPRESSION: 1. Chronic grade 1 anterolisthesis of L4 on L5 appears mildly progressed since 2023 radiographs, with mild disc but moderate to severe facet arthropathy. Up to mild bilateral L4 foraminal stenosis. No spinal stenosis. 2. Generally mild for age lumbar spine degeneration elsewhere and no convincing neural impingement. Although up to moderate facet hypertrophy elsewhere, subtle retrolisthesis at L2-L3. 3. Mild acute on chronic lower thoracic endplate degeneration at T10-T11. Subtle anterolisthesis there.  10/16/22 - XR Left & Right knees: No medial or lateral compartment narrowing was noted.  Moderate patellofemoral narrowing was noted.   Impression: These findings are suggestive of moderate chondromalacia  patella of the knee.   10/16/22 - XR B hips:  No hip joint narrowing was noted.  No chondrocalcinosis was noted.   Osteoarthritic changes with mild sclerosis was noted in the SI joints.   Impression: Unremarkable x-rays of the hip joints.   PATIENT SURVEYS:  LEFS 12 / 80 = 15.0 %  05/14/23: Modified Oswestry  24 / 50 = 48.0 %  LEFS  43 / 80 = 53.8 %  COGNITION: Overall cognitive status: Within functional limits for tasks assessed    SENSATION: Intermittent B LE numbness and tingling    EDEMA:  Not normally an issue  POSTURE:  rounded shoulders, forward head, flexed trunk, but knee alignment relatively neutral  PALPATION: decreased patellar mobility L>R with poor VMO activation on quad set  05/14/23 Increased muscle tension & TTP in upper thoracic paraspinals, rhomboids, upper traps,  LS; B upper glutes  MUSCLE LENGTH: Hamstrings: mod tight R>L; 05/14/23 slight tight R vs mild tight on L ITB: mild/mod tight R>L; 05/14/23 slight tight L=R Piriformis: mild/mod tight R>L Hip flexors: mild/mod tight R>L Quads: mod tight R>L Heelcord: mod tight B QL: mod tight L>R - 05/14/23  LOWER EXTREMITY ROM:  Active ROM Right eval Left eval  Knee flexion 138 141  Knee extension -2 / 0* -2 / 0*  (Blank rows = not tested, * = supported knee extension)  LOWER EXTREMITY MMT:  MMT Right eval Left eval R 05/14/23 L 05/14/23  Hip flexion 3+ 3+ 4 4  Hip extension 4 4 4- 4  Hip abduction 4- 4- 4 4-  Hip adduction 4 4 4+ 4-  Hip internal rotation 4- 4 4- 4-  Hip external rotation 4- 4- 4+ 4  Knee flexion 4 4 5  4+  Knee extension 3+ 4- 5 5  Ankle dorsiflexion 4 4 4+ 4+  Ankle plantarflexion 4- (7 SLS HR) 4- (6 SLS HR)    Ankle inversion      Ankle eversion       (Blank rows = not tested)  LOWER EXTREMITY SPECIAL TESTS:  Hip special tests: Ober's test: positive on R Knee special tests: Lateral pull sign: positive B and Patellafemoral grind test: positive B  FUNCTIONAL TESTS: (03/24/23) 5 times sit to stand: 20.03 sec; >15 sec indicates recurrent fall risk 10 meter walk test: 10.46 sec; Gait speed = 3.14 ft/sec, >2.62 ft/sec indicates community ambulator  Functional gait assessment: 19/30; 19-24 = medium risk fall   GAIT: Distance walked: clinic distances Assistive device utilized: Environmental consultant - 2 wheeled Level of assistance: Modified independence Gait pattern: lateral lean- Right and trunk flexed Comments: RW height adjusted 1 notch taller and cues provided for better upright posture and RW proximity  LUMBAR ROM:   Active  05/14/23  Flexion Ankle level, stretch  Extension WFL, upper buttock  Right lateral flexion Fibular head  Left lateral flexion Fibular head, feels stretch  Right rotation WFL, cramp in shoulder  Left rotation WFL   (Blank rows = not tested)   TODAY'S TREATMENT:    05/14/23 - Re-Eval for Lumbar Spine / Recert of B knee pain Modified Oswestry  24 / 50 = 48.0 %  LEFS  43 / 80 = 53.8 %  SELF CARE:  Reviewed back eval findings and role of PT in addressing identified deficits as well as instruction in proper sitting posture to reduce low back strain and LE radiculopathy.    THERAPEUTIC EXERCISE: To improve strength, ROM, and flexibility.  Demonstration, verbal and tactile cues  throughout for technique. Brief review of prior HEP POE x 30' Prone press up - tight across lower belly; feels good in back but feels pressure in shoulders x 30' Side-lying thoracic rotation w/ open book x 5 - noted cramps in shoulder/scapular Hooklying lumbar rotation x 5 Hooklying sequential march x 3   04/30/23 Therapeutic Exercise: to improve strength, ROM, and flexibility  Bike L2x12min Open book in S/L reviewed LTR reviewed bil   Manual Therapy: to decrease muscle spasm, pain and improve mobility.  IASTM to TL junction, R glutes, lumbar paraspinals   04/23/23 Therapeutic Exercise: to improve strength, ROM, and flexibility  Nustep L5x25min UE/LE Standing hip abduction x 10  Standing hip extension x 10  Seated LAQ + ball squeeze x 10 bil Supine SLR with ER x 10- pt asked to reviewed S/L clamshell YTB 2 x 10 bil Bridges with TrA x 10 Supine LE raise and lower     PATIENT EDUCATION:  Education details: progress with PT, ongoing PT POC, and HEP update  Person educated: Patient Education method: Explanation, Demonstration, Verbal cues, Handouts, and MedBridgeGO app updated  Education comprehension: verbalized understanding, returned demonstration, verbal cues required, and needs further education  HOME EXERCISE PROGRAM: *Pt using MedBridgeGO app  Access Code: [email or web address] URL: [email or web address] Date: 05/14/2023 Prepared by: Glenetta Hew  Exercises - Supine Hamstring Stretch with Strap  - 2 x daily - 7 x weekly - 3 reps - 30 sec hold - Supine  Iliotibial Band Stretch with Strap  - 2 x daily - 7 x weekly - 3 reps - 30 sec hold - Prone Hip Flexor & Quad Stretch with Strap  - 2 x daily - 7 x weekly - 3 reps - 30 sec hold - Supine Hip Adduction Isometric with Ball  - 2 x daily - 7 x weekly - 2 sets - 10 reps - 5 sec hold - Active Straight Leg Raise with Quad Set  - 2 x daily - 7 x weekly - 2 sets - 10 reps - 3-5 sec hold - Straight Leg Raise with External Rotation  - 2 x daily - 7 x weekly - 1-2 sets - 10 reps - 3 sec hold - Clamshell with Resistance  - 1 x daily - 7 x weekly - 2 sets - 10 reps - Seated Abdominal Press into [employer]  - 1 x daily - 7 x weekly - 2 sets - 10 reps - 7 second hold - Supine Lower Trunk Rotation  - 1-2 x daily - 7 x weekly - 5 reps - 10 sec hold - Sidelying Thoracic Rotation with Open Book  - 1-2 x daily - 7 x weekly - 10 reps - 5 sec hold - Hooklying Sequential Leg March and Lower  - 1 x daily - 7 x weekly - 2 sets - 10 reps - 3 sec hold  Patient Education - Office Posture   ASSESSMENT:  CLINICAL IMPRESSION: Amanda Maynard is a 63 y.o. female who has been working with PT for chronic B knee pain who now has a new referral for lumbar radiculopathy, chronic bilateral low back pain with bilateral sciatica, and chronic pain syndrome.  She reports 80% improvement in her B knee pain with improving tolerance for squatting motion but is still limited with stair negotiation due to B knee pain.  LEFS has improved from 12/80 to 43/80.  She reports her back pain has been better since her ESI last Thursday however the relief has been mostly  localized to her R lower back with ongoing L-sided LBP as well as B thoracic and neck pain.  She experiences some L>R radicular numbness and tingling most commonly triggered with prolonged sitting at her desk where her legs are dangling or on the commode - we discussed sitting posture focusing on spreading pressure evenly over back of thighs with feet elevated to maintain 90/90 hips and  knees. Lumbar ROM and proximal LE flexibility only mildly limited but some motions triggering muscle cramps or spasms.  On Modified Oswestry patient scored 24/50 demonstrating 48% or severe disability.  Amanda Maynard feels like the ESI has left her feeling lopsided and more prone to lose her balance causing her to rely more on her RW, therefore will plan to reassess FGA next visit.  Amanda Maynard will benefit from continued skilled PT to address back issues and ongoing knee deficits to improve mobility and activity tolerance with decreased pain interference.   OBJECTIVE IMPAIRMENTS: Abnormal gait, decreased activity tolerance, decreased balance, decreased endurance, decreased knowledge of condition, decreased knowledge of use of DME, decreased mobility, difficulty walking, decreased strength, hypomobility, increased fascial restrictions, impaired perceived functional ability, increased muscle spasms, impaired flexibility, impaired sensation, improper body mechanics, postural dysfunction, and pain.   ACTIVITY LIMITATIONS: carrying, lifting, bending, sitting, standing, squatting, sleeping, stairs, transfers, bed mobility, toileting, locomotion level, and caring for others  PARTICIPATION LIMITATIONS: meal prep, cleaning, laundry, driving, shopping, community activity, and walking her dog  PERSONAL FACTORS: Age, Fitness, Past/current experiences, Time since onset of injury/illness/exacerbation, and 3+ comorbidities: Osteoarthritis - multiple joints, anxiety, depression, GERD, headache, thyroid disease - hypothyroidism, urinary incontinence, SVT, lumbar DDD, chronic LBP, sciatica  are also affecting patient's functional outcome.   REHAB POTENTIAL: Good  CLINICAL DECISION MAKING: Unstable/unpredictable  EVALUATION COMPLEXITY: High   GOALS: Goals reviewed with patient? Yes  SHORT TERM GOALS: Target date:  04/16/2023  Patient will be independent with initial HEP. Baseline:  Goal status: MET - 04/23/23  2.  Patient  will report at least 25% improvement in B knee pain to improve QOL. Baseline: 5/10 on eval, at worst 10/10  Goal status: MET - 04/23/23  LONG TERM GOALS: Target date: 05/14/2023; Extended to 07/09/2023  Patient will be independent with advanced/ongoing HEP to improve outcomes and carryover.  Baseline:  Goal status: IN PROGRESS - 05/14/23 - met for current HEP  2.  Patient will report at least 50-75% improvement in B knee pain to improve QOL. Baseline: 5/10 currently, at worst 10/10  Goal status: IN PROGRESS - 05/14/23 - 80% improvement in B knee pain  3.  Patient will demonstrate improved B LE strength to >/= 4+/5 for improved stability and ease of mobility. Baseline: Refer to above LE MMT table Goal status: PARTIALLY MET - 05/14/23  4.  Patient will be able to ambulate 600' with or w/o LRAD and normal gait pattern without increased pain to access community.  Baseline: Antalgic gait with flexed trunk using RW Goal status: IN PROGRESS - 05/14/23 - still using RW due to feeling of instability/uneven balance since ESI  5. Patient will be able to ascend/descend stairs with 1 HR and reciprocal step pattern safely to access home and community.  Baseline: NT, but pt reports severe pain with stair negotiation at home Goal status: IN PROGRESS - 05/14/23 - pt reports stairs are still painful but she is able to approach stairs reciprocally  6.  Patient will report ability to squat sufficient to complete normal daily tasks w/o limitation due to pain or weakness.  Baseline:  Goal status: IN PROGRESS - 05/14/23 - able to complete partial squat w/o increased pain but cues necessary for proper alignment and posterior weight shift at hips  7.  Patient will report >/= 25/80 on LEFS (MCID = 9 pts) to demonstrate improved functional ability. Baseline: 12 / 80 = 15.0 % Goal status: MET - 05/14/23 - 43 / 80 = 53.8 %  8.  Patient will demonstrate at least 23/30 on FGA to decrease risk of falls. Baseline: 19/30  (03/24/23) Goal status: REVISED - 05/14/23   9.  Patient will report at least 50-75% improvement in back pain to improve QOL. Baseline: 7/10 (05/14/23) Goal status: INITIAL  10.  Patient will report </= 36% on modified Oswestry to demonstrate improved functional ability. Baseline: 24 / 50 = 48.0 % (05/14/23) Goal status: INITIAL   PLAN:  PT FREQUENCY: 1-2x/week  PT DURATION: 8 weeks  PLANNED INTERVENTIONS: 97164- PT Re-evaluation, 97110-Therapeutic exercises, 97530- Therapeutic activity, 97112- Neuromuscular re-education, 97535- Self Care, 98119- Manual therapy, [phone]- Gait training, [phone]- Aquatic Therapy, [phone]- Electrical stimulation (unattended), [phone]- Electrical stimulation (manual), 97016- Vasopneumatic device, [id number]- Ultrasound, [id number]- Ionotophoresis 4mg /ml Dexamethasone, Patient/Family education, Balance training, Stair training, Taping, Dry Needling, Joint mobilization, DME instructions, Cryotherapy, Moist heat, and 97750- Physical Performance Test   ([place] not covered by [employer])  PLAN FOR NEXT SESSION: Reassess FGA; work on lumbar flexibility and stretching; progress core and proximal LE strengthening - review/update HEP accordingly; MT +/- DN for for thoracolumbar paraspinals and upper glutes; possible trial of kinesiotaping for chondromalacia patella  Marry Guan, PT 05/14/2023, 1:52 PM

## 2023-05-20 ENCOUNTER — Other Ambulatory Visit: Payer: Self-pay | Admitting: Family Medicine

## 2023-05-20 DIAGNOSIS — E039 Hypothyroidism, unspecified: Secondary | ICD-10-CM

## 2023-05-21 ENCOUNTER — Ambulatory Visit

## 2023-05-21 ENCOUNTER — Other Ambulatory Visit: Payer: Self-pay

## 2023-05-21 ENCOUNTER — Other Ambulatory Visit (HOSPITAL_BASED_OUTPATIENT_CLINIC_OR_DEPARTMENT_OTHER): Payer: Self-pay

## 2023-05-21 DIAGNOSIS — R2689 Other abnormalities of gait and mobility: Secondary | ICD-10-CM

## 2023-05-21 DIAGNOSIS — G8929 Other chronic pain: Secondary | ICD-10-CM

## 2023-05-21 DIAGNOSIS — M62830 Muscle spasm of back: Secondary | ICD-10-CM

## 2023-05-21 DIAGNOSIS — M5459 Other low back pain: Secondary | ICD-10-CM

## 2023-05-21 DIAGNOSIS — M6281 Muscle weakness (generalized): Secondary | ICD-10-CM

## 2023-05-21 MED ORDER — SYNTHROID 75 MCG PO TABS
75.0000 ug | ORAL_TABLET | Freq: Every day | ORAL | 1 refills | Status: DC
  Filled 2023-05-21: qty 90, 90d supply, fill #0
  Filled 2023-08-17: qty 90, 90d supply, fill #1

## 2023-05-21 NOTE — Therapy (Signed)
 OUTPATIENT PHYSICAL THERAPY TREATMENT     Patient Name: MARYANA PITTMON MRN: [id number] DOB:1961/01/23, 63 y.o., female Today's Date: 05/21/2023   END OF SESSION:  PT End of Session - 05/21/23 1410     Visit Number 7    Date for PT Re-Evaluation 07/09/23    Authorization Type [employer]    PT Start Time 1405    PT Stop Time 1446    PT Time Calculation (min) 41 min    Activity Tolerance Patient tolerated treatment well    Behavior During Therapy [facility] for tasks assessed/performed                   Past Medical History:  Diagnosis Date   Allergy    Anxiety    Arthritis    Chicken pox    Depression    Diverticulitis    Emphysema of lung (HCC)    GERD (gastroesophageal reflux disease)    Headache    History of colon polyps    Jaundice    Lichen planopilaris    Thyroid disease    Urinary incontinence    Past Surgical History:  Procedure Laterality Date   ABDOMINAL HYSTERECTOMY  2012   APPENDECTOMY  2009   COLONOSCOPY  12/02/2016   Dr. Randle Butler. Moderate diverticulosis of the ascending colon, transverse colon, descending colon and sigmoid colon, increase in vascularity of the rectum. External hemorrhoids.   COLONOSCOPY  08/09/2013   Dr. Randle Butler. Moderate diverticulosis of the hepatic flexure, transverse colon, splenic flexure, descending colon and sigmoid colon. Polyp (7mm) in the proximal sigmoid colon. (polypectomy). External hemorrhoids.   ESOPHAGOGASTRODUODENOSCOPY  08/09/2013   Dr. Randle Butler. Normal stomach. Normal duodenum. Grade 1 esophagitis was seen in the lower third of the esophagus.   Patient Active Problem List   Diagnosis Date Noted   Osteopenia 05/01/2023   Supraventricular tachycardia (HCC) 12/13/2022   Palpitations 12/13/2022   Chondromalacia of both patellae 12/05/2022   Primary osteoarthritis of both hands 12/05/2022   Degeneration of intervertebral disc of lumbar region without discogenic back pain or lower extremity pain 12/05/2022    Arthritis of finger of left hand 05/10/2022   Bilateral hand pain 05/06/2022   Migraine 04/10/2022   Anxiety and depression 02/04/2022   Chronic lower back pain 12/05/2021   Chest pain 10/10/2021   Hepatitis C antibody test positive 12/13/2020   Hypothyroidism (acquired) 12/13/2020   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   Arthritis 02/05/2020   Sciatica 02/05/2020    PCP: Kaylee Partridge, MD   REFERRING PROVIDER: Darryll Eng, NP (back); Wes Hamman, MD (knees)  REFERRING DIAG:  M54.16 (ICD-10-CM) - Lumbar radiculopathy  M54.42,M54.41,G89.29 (ICD-10-CM) - Chronic bilateral low back pain with bilateral sciatica  G89.4 (ICD-10-CM) - Chronic pain syndrome  M25.561,M25.562,G89.29 (ICD-10-CM) - Chronic pain of both knee   THERAPY DIAG:  Other low back pain  Muscle spasm of back  Muscle weakness (generalized)  Other abnormalities of gait and mobility  Chronic pain of both knees  RATIONALE FOR EVALUATION AND TREATMENT: Rehabilitation  ONSET DATE: Chronic for knee and back  NEXT MD VISIT:  None scheduled / PRN with both referring providers    SUBJECTIVE:  SUBJECTIVE STATEMENT: Pt reports that her knees still hurt, especially when navigating stairs. Her lower back and neck are still bothering her.  EVAL (for knees): Pt reports she has pain everywhere and is hoping to get on disability.  She is here for her B knee pain but reports her back pain is the worst - has an appt at [facility] to see someone for her back this afternoon.  Also has pain in most of her joints, esp hands.  She feels like her knee pain has been worse with the cold weather this winter.  She reports burning and popping in B knees with severe pain when climbing stairs.  She reports times where she wakes up with  B LE numb, progressing to tingling/pin-and-needles.   PAIN:  Are you having pain? Yes: NPRS scale: 8/10 neck, 6/10 back  Pain location: L low back, B thoracic & neck Pain description: tightness and burning (feels muscular)  Aggravating factors: vacuuming, walking the dog  Relieving factors: hooklying figure-4 LTR, flexion stretch     Are you having pain? Yes: NPRS scale: 2/10   Pain location: L anterior knee/peripatellar  Pain description: grabbing, throbbing, popping, burning, soreness and aching depending on the activity  Aggravating factors: only when knee pops   Relieving factors: ice, standing still, ibuprofen, Doterrra Deep Blue    PERTINENT HISTORY:  Osteoarthritis - multiple joints, anxiety, depression, GERD, headache, thyroid disease - hypothyroidism, urinary incontinence, SVT, lumbar DDD, chronic LBP, sciatica  PRECAUTIONS: Fall  RED FLAGS: Bowel or bladder incontinence: Yes: bladder issues since her hysterectomy  WEIGHT BEARING RESTRICTIONS: No  FALLS:  Has patient fallen in last 6 months? No, 2 falls in early 2024  LIVING ENVIRONMENT: Lives with: lives with their family (lives downstairs from mother) Lives in: House/apartment Stairs: Yes: Internal: 13 steps; on right going up and External: 5 steps; on right going up Has following equipment at home: Single point cane, Walker - 2 wheeled, shower chair, and pedal bike  OCCUPATION: Out of work since Dec 2021  PLOF: Independent and Leisure: walking the dog, washing dishes, light cooking, driving for her mother's appts, relatively sedentary   PATIENT GOALS: "To be able to get up and down from a squat and be able to crawl."   OBJECTIVE: (objective measures completed at initial evaluation unless otherwise dated)  DIAGNOSTIC FINDINGS:  04/05/23 - MR lumbar spine: IMPRESSION: 1. Chronic grade 1 anterolisthesis of L4 on L5 appears mildly progressed since 2023 radiographs, with mild disc but moderate to severe facet  arthropathy. Up to mild bilateral L4 foraminal stenosis. No spinal stenosis. 2. Generally mild for age lumbar spine degeneration elsewhere and no convincing neural impingement. Although up to moderate facet hypertrophy elsewhere, subtle retrolisthesis at L2-L3. 3. Mild acute on chronic lower thoracic endplate degeneration at T10-T11. Subtle anterolisthesis there.  10/16/22 - XR Left & Right knees: No medial or lateral compartment narrowing was noted.  Moderate patellofemoral narrowing was noted.   Impression: These findings are suggestive of moderate chondromalacia  patella of the knee.   10/16/22 - XR B hips:  No hip joint narrowing was noted.  No chondrocalcinosis was noted.   Osteoarthritic changes with mild sclerosis was noted in the SI joints.   Impression: Unremarkable x-rays of the hip joints.   PATIENT SURVEYS:  LEFS 12 / 80 = 15.0 %  05/14/23: Modified Oswestry  24 / 50 = 48.0 %  LEFS  43 / 80 = 53.8 %  COGNITION: Overall cognitive status: Within functional  limits for tasks assessed    SENSATION: Intermittent B LE numbness and tingling    EDEMA:  Not normally an issue  POSTURE:  rounded shoulders, forward head, flexed trunk, but knee alignment relatively neutral  PALPATION: decreased patellar mobility L>R with poor VMO activation on quad set  05/14/23 Increased muscle tension & TTP in upper thoracic paraspinals, rhomboids, upper traps, LS; B upper glutes  MUSCLE LENGTH: Hamstrings: mod tight R>L; 05/14/23 slight tight R vs mild tight on L ITB: mild/mod tight R>L; 05/14/23 slight tight L=R Piriformis: mild/mod tight R>L Hip flexors: mild/mod tight R>L Quads: mod tight R>L Heelcord: mod tight B QL: mod tight L>R - 05/14/23  LOWER EXTREMITY ROM:  Active ROM Right eval Left eval  Knee flexion 138 141  Knee extension -2 / 0* -2 / 0*  (Blank rows = not tested, * = supported knee extension)  LOWER EXTREMITY MMT:  MMT Right eval Left eval R 05/14/23 L 05/14/23  Hip  flexion 3+ 3+ 4 4  Hip extension 4 4 4- 4  Hip abduction 4- 4- 4 4-  Hip adduction 4 4 4+ 4-  Hip internal rotation 4- 4 4- 4-  Hip external rotation 4- 4- 4+ 4  Knee flexion 4 4 5  4+  Knee extension 3+ 4- 5 5  Ankle dorsiflexion 4 4 4+ 4+  Ankle plantarflexion 4- (7 SLS HR) 4- (6 SLS HR)    Ankle inversion      Ankle eversion       (Blank rows = not tested)  LOWER EXTREMITY SPECIAL TESTS:  Hip special tests: Ober's test: positive on R Knee special tests: Lateral pull sign: positive B and Patellafemoral grind test: positive B  FUNCTIONAL TESTS: (03/24/23) 5 times sit to stand: 20.03 sec; >15 sec indicates recurrent fall risk 10 meter walk test: 10.46 sec; Gait speed = 3.14 ft/sec, >2.62 ft/sec indicates community ambulator  Functional gait assessment: 19/30; 19-24 = medium risk fall   GAIT: Distance walked: clinic distances Assistive device utilized: Environmental consultant - 2 wheeled Level of assistance: Modified independence Gait pattern: lateral lean- Right and trunk flexed Comments: RW height adjusted 1 notch taller and cues provided for better upright posture and RW proximity  LUMBAR ROM:   Active  05/14/23  Flexion Ankle level, stretch  Extension WFL, upper buttock  Right lateral flexion Fibular head  Left lateral flexion Fibular head, feels stretch  Right rotation WFL, cramp in shoulder  Left rotation WFL   (Blank rows = not tested)   TODAY'S TREATMENT:  05/21/23 Nustep L5x97min UE/LE Knee flexion 15lb 2x10 BLE Knee extension 10lb 2x10 BLE Standing rows RTB 2 x 10 Standing pallof press RTB x 15 bil   [facility] PT Assessment - 05/21/23 0001       Functional Gait  Assessment   Gait Level Surface Walks 20 ft in less than 5.5 sec, no assistive devices, good speed, no evidence for imbalance, normal gait pattern, deviates no more than 6 in outside of the 12 in walkway width.    Change in Gait Speed Able to smoothly change walking speed without loss of balance or gait deviation. Deviate  no more than 6 in outside of the 12 in walkway width.    Gait with Horizontal Head Turns Performs head turns smoothly with slight change in gait velocity (eg, minor disruption to smooth gait path), deviates 6-10 in outside 12 in walkway width, or uses an assistive device.    Gait with Vertical Head Turns Performs head  turns with no change in gait. Deviates no more than 6 in outside 12 in walkway width.    Gait and Pivot Turn Pivot turns safely within 3 sec and stops quickly with no loss of balance.    Step Over Obstacle Is able to step over 2 stacked shoe boxes taped together (9 in total height) without changing gait speed. No evidence of imbalance.    Gait with Narrow Base of Support Is able to ambulate for 10 steps heel to toe with no staggering.    Gait with Eyes Closed Walks 20 ft, uses assistive device, slower speed, mild gait deviations, deviates 6-10 in outside 12 in walkway width. Ambulates 20 ft in less than 9 sec but greater than 7 sec.    Ambulating Backwards Walks 20 ft, uses assistive device, slower speed, mild gait deviations, deviates 6-10 in outside 12 in walkway width.    Steps Alternating feet, no rail.    Total Score 27             05/14/23 - Re-Eval for Lumbar Spine / Recert of B knee pain Modified Oswestry  24 / 50 = 48.0 %  LEFS  43 / 80 = 53.8 %  SELF CARE:  Reviewed back eval findings and role of PT in addressing identified deficits as well as instruction in proper sitting posture to reduce low back strain and LE radiculopathy.    THERAPEUTIC EXERCISE: To improve strength, ROM, and flexibility.  Demonstration, verbal and tactile cues throughout for technique. Brief review of prior HEP POE x 30' Prone press up - tight across lower belly; feels good in back but feels pressure in shoulders x 30' Side-lying thoracic rotation w/ open book x 5 - noted cramps in shoulder/scapular Hooklying lumbar rotation x 5 Hooklying sequential march x 3   04/30/23 Therapeutic  Exercise: to improve strength, ROM, and flexibility  Bike L2x49min Open book in S/L reviewed LTR reviewed bil   Manual Therapy: to decrease muscle spasm, pain and improve mobility.  IASTM to TL junction, R glutes, lumbar paraspinals   04/23/23 Therapeutic Exercise: to improve strength, ROM, and flexibility  Nustep L5x85min UE/LE Standing hip abduction x 10  Standing hip extension x 10  Seated LAQ + ball squeeze x 10 bil Supine SLR with ER x 10- pt asked to reviewed S/L clamshell YTB 2 x 10 bil Bridges with TrA x 10 Supine LE raise and lower     PATIENT EDUCATION:  Education details: HEP update - rows and pallof press  Person educated: Patient Education method: Explanation, Demonstration, Verbal cues, Handouts, and MedBridgeGO app updated  Education comprehension: verbalized understanding, returned demonstration, verbal cues required, and needs further education  HOME EXERCISE PROGRAM: *Pt using MedBridgeGO app  Access Code: [email or web address] URL: [email or web address] Date: 05/21/2023 Prepared by: Verta Ellen  Exercises - Supine Hamstring Stretch with Strap  - 2 x daily - 7 x weekly - 3 reps - 30 sec hold - Supine Iliotibial Band Stretch with Strap  - 2 x daily - 7 x weekly - 3 reps - 30 sec hold - Prone Hip Flexor & Quad Stretch with Strap  - 2 x daily - 7 x weekly - 3 reps - 30 sec hold - Supine Hip Adduction Isometric with Ball  - 2 x daily - 7 x weekly - 2 sets - 10 reps - 5 sec hold - Active Straight Leg Raise with Quad Set  - 2 x daily - 7 x weekly - 2 sets -  10 reps - 3-5 sec hold - Straight Leg Raise with External Rotation  - 2 x daily - 7 x weekly - 1-2 sets - 10 reps - 3 sec hold - Clamshell with Resistance  - 1 x daily - 7 x weekly - 2 sets - 10 reps - Supine Lower Trunk Rotation  - 1-2 x daily - 7 x weekly - 5 reps - 10 sec hold - Sidelying Thoracic Rotation with Open Book  - 1-2 x daily - 7 x weekly - 10 reps - 5 sec hold - Hooklying Sequential Leg March  and Lower  - 1 x daily - 7 x weekly - 2 sets - 10 reps - 3 sec hold - Standing Shoulder Row with Anchored Resistance  - 1 x daily - 3 x weekly - 2 sets - 10 reps - Standing Anti-Rotation Press with Anchored Resistance  - 1 x daily - 3 x weekly - 2 sets - 10 reps  Patient Education - Office Posture   ASSESSMENT:  CLINICAL IMPRESSION: Pt has scored 27/30 on FGA showing a low fall risk, meeting LTG #8. Progressed patient through machine level strengthening, core stability, and postural strengthening interventions all to improve her functional mobility and upright stability. Instructed her through each interventions as needed to allow for the correct movements. Provided updates for her HEP as well with postural and core strengthening.  Mohini will benefit from continued skilled PT to address back issues and ongoing knee deficits to improve mobility and activity tolerance with decreased pain interference.   OBJECTIVE IMPAIRMENTS: Abnormal gait, decreased activity tolerance, decreased balance, decreased endurance, decreased knowledge of condition, decreased knowledge of use of DME, decreased mobility, difficulty walking, decreased strength, hypomobility, increased fascial restrictions, impaired perceived functional ability, increased muscle spasms, impaired flexibility, impaired sensation, improper body mechanics, postural dysfunction, and pain.   ACTIVITY LIMITATIONS: carrying, lifting, bending, sitting, standing, squatting, sleeping, stairs, transfers, bed mobility, toileting, locomotion level, and caring for others  PARTICIPATION LIMITATIONS: meal prep, cleaning, laundry, driving, shopping, community activity, and walking her dog  PERSONAL FACTORS: Age, Fitness, Past/current experiences, Time since onset of injury/illness/exacerbation, and 3+ comorbidities: Osteoarthritis - multiple joints, anxiety, depression, GERD, headache, thyroid disease - hypothyroidism, urinary incontinence, SVT, lumbar DDD,  chronic LBP, sciatica  are also affecting patient's functional outcome.   REHAB POTENTIAL: Good  CLINICAL DECISION MAKING: Unstable/unpredictable  EVALUATION COMPLEXITY: High   GOALS: Goals reviewed with patient? Yes  SHORT TERM GOALS: Target date:  04/16/2023  Patient will be independent with initial HEP. Baseline:  Goal status: MET - 04/23/23  2.  Patient will report at least 25% improvement in B knee pain to improve QOL. Baseline: 5/10 on eval, at worst 10/10  Goal status: MET - 04/23/23  LONG TERM GOALS: Target date: 05/14/2023; Extended to 07/09/2023  Patient will be independent with advanced/ongoing HEP to improve outcomes and carryover.  Baseline:  Goal status: IN PROGRESS - 05/14/23 - met for current HEP  2.  Patient will report at least 50-75% improvement in B knee pain to improve QOL. Baseline: 5/10 currently, at worst 10/10  Goal status: IN PROGRESS - 05/14/23 - 80% improvement in B knee pain  3.  Patient will demonstrate improved B LE strength to >/= 4+/5 for improved stability and ease of mobility. Baseline: Refer to above LE MMT table Goal status: PARTIALLY MET - 05/14/23  4.  Patient will be able to ambulate 600' with or w/o LRAD and normal gait pattern without increased pain to access  community.  Baseline: Antalgic gait with flexed trunk using RW Goal status: IN PROGRESS - 05/14/23 - still using RW due to feeling of instability/uneven balance since ESI  5. Patient will be able to ascend/descend stairs with 1 HR and reciprocal step pattern safely to access home and community.  Baseline: NT, but pt reports severe pain with stair negotiation at home Goal status: IN PROGRESS - 05/14/23 - pt reports stairs are still painful but she is able to approach stairs reciprocally  6.  Patient will report ability to squat sufficient to complete normal daily tasks w/o limitation due to pain or weakness. Baseline:  Goal status: IN PROGRESS - 05/14/23 - able to complete partial squat w/o  increased pain but cues necessary for proper alignment and posterior weight shift at hips  7.  Patient will report >/= 25/80 on LEFS (MCID = 9 pts) to demonstrate improved functional ability. Baseline: 12 / 80 = 15.0 % Goal status: MET - 05/14/23 - 43 / 80 = 53.8 %  8.  Patient will demonstrate at least 23/30 on FGA to decrease risk of falls. Baseline: 19/30 (03/24/23) Goal status: MET - 05/21/23   9.  Patient will report at least 50-75% improvement in back pain to improve QOL. Baseline: 7/10 (05/14/23) Goal status: INITIAL  10.  Patient will report </= 36% on modified Oswestry to demonstrate improved functional ability. Baseline: 24 / 50 = 48.0 % (05/14/23) Goal status: INITIAL   PLAN:  PT FREQUENCY: 1-2x/week  PT DURATION: 8 weeks  PLANNED INTERVENTIONS: 97164- PT Re-evaluation, 97110-Therapeutic exercises, 97530- Therapeutic activity, 97112- Neuromuscular re-education, 97535- Self Care, 29562- Manual therapy, [phone]- Gait training, [phone]- Aquatic Therapy, [phone]- Electrical stimulation (unattended), [phone]- Electrical stimulation (manual), 97016- Vasopneumatic device, [id number]- Ultrasound, [id number]- Ionotophoresis 4mg /ml Dexamethasone, Patient/Family education, Balance training, Stair training, Taping, Dry Needling, Joint mobilization, DME instructions, Cryotherapy, Moist heat, and 97750- Physical Performance Test   ([place] not covered by [employer])  PLAN FOR NEXT SESSION: Reassess FGA; work on lumbar flexibility and stretching; progress core and proximal LE strengthening - review/update HEP accordingly; MT +/- DN for for thoracolumbar paraspinals and upper glutes; possible trial of kinesiotaping for chondromalacia patella  Samuella Crocker, PTA 05/21/2023, 2:56 PM

## 2023-05-28 ENCOUNTER — Ambulatory Visit

## 2023-05-28 DIAGNOSIS — M5459 Other low back pain: Secondary | ICD-10-CM

## 2023-05-28 DIAGNOSIS — G8929 Other chronic pain: Secondary | ICD-10-CM

## 2023-05-28 DIAGNOSIS — M62830 Muscle spasm of back: Secondary | ICD-10-CM

## 2023-05-28 DIAGNOSIS — M6281 Muscle weakness (generalized): Secondary | ICD-10-CM

## 2023-05-28 DIAGNOSIS — R2689 Other abnormalities of gait and mobility: Secondary | ICD-10-CM

## 2023-05-28 NOTE — Therapy (Signed)
 OUTPATIENT PHYSICAL THERAPY TREATMENT     Patient Name: Amanda Maynard MRN: [id number] DOB:Jan 29, 1961, 63 y.o., female Today's Date: 05/28/2023   END OF SESSION:  PT End of Session - 05/28/23 1107     Visit Number 8    Date for PT Re-Evaluation 07/09/23    Authorization Type [employer]    PT Start Time 1019    PT Stop Time 1103    PT Time Calculation (min) 44 min    Activity Tolerance Patient tolerated treatment well    Behavior During Therapy WFL for tasks assessed/performed                    Past Medical History:  Diagnosis Date   Allergy    Anxiety    Arthritis    Chicken pox    Depression    Diverticulitis    Emphysema of lung (HCC)    GERD (gastroesophageal reflux disease)    Headache    History of colon polyps    Jaundice    Lichen planopilaris    Thyroid  disease    Urinary incontinence    Past Surgical History:  Procedure Laterality Date   ABDOMINAL HYSTERECTOMY  2012   APPENDECTOMY  2009   COLONOSCOPY  12/02/2016   Dr. Randle Butler. Moderate diverticulosis of the ascending colon, transverse colon, descending colon and sigmoid colon, increase in vascularity of the rectum. External hemorrhoids.   COLONOSCOPY  08/09/2013   Dr. Randle Butler. Moderate diverticulosis of the hepatic flexure, transverse colon, splenic flexure, descending colon and sigmoid colon. Polyp (7mm) in the proximal sigmoid colon. (polypectomy). External hemorrhoids.   ESOPHAGOGASTRODUODENOSCOPY  08/09/2013   Dr. Randle Butler. Normal stomach. Normal duodenum. Grade 1 esophagitis was seen in the lower third of the esophagus.   Patient Active Problem List   Diagnosis Date Noted   Osteopenia 05/01/2023   Supraventricular tachycardia (HCC) 12/13/2022   Palpitations 12/13/2022   Chondromalacia of both patellae 12/05/2022   Primary osteoarthritis of both hands 12/05/2022   Degeneration of intervertebral disc of lumbar region without discogenic back pain or lower extremity pain 12/05/2022    Arthritis of finger of left hand 05/10/2022   Bilateral hand pain 05/06/2022   Migraine 04/10/2022   Anxiety and depression 02/04/2022   Chronic lower back pain 12/05/2021   Chest pain 10/10/2021   Hepatitis C antibody test positive 12/13/2020   Hypothyroidism (acquired) 12/13/2020   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   Arthritis 02/05/2020   Sciatica 02/05/2020    PCP: Kaylee Partridge, MD   REFERRING PROVIDER: Darryll Eng, NP (back); Wes Hamman, MD (knees)  REFERRING DIAG:  M54.16 (ICD-10-CM) - Lumbar radiculopathy  M54.42,M54.41,G89.29 (ICD-10-CM) - Chronic bilateral low back pain with bilateral sciatica  G89.4 (ICD-10-CM) - Chronic pain syndrome  M25.561,M25.562,G89.29 (ICD-10-CM) - Chronic pain of both knee   THERAPY DIAG:  Other low back pain  Muscle spasm of back  Muscle weakness (generalized)  Other abnormalities of gait and mobility  Chronic pain of both knees  RATIONALE FOR EVALUATION AND TREATMENT: Rehabilitation  ONSET DATE: Chronic for knee and back  NEXT MD VISIT:  None scheduled / PRN with both referring providers    SUBJECTIVE:  SUBJECTIVE STATEMENT: Pt reports that she was sweeping yesterday, now today her back and knees hurt more today  EVAL (for knees): Pt reports she has pain everywhere and is hoping to get on disability.  She is here for her B knee pain but reports her back pain is the worst - has an appt at [facility] to [facility] someone for her back this afternoon.  Also has pain in most of her joints, esp hands.  She feels like her knee pain has been worse with the cold weather this winter.  She reports burning and popping in B knees with severe pain when climbing stairs.  She reports times where she wakes up with B LE numb, progressing to  tingling/pin-and-needles.   PAIN:  Are you having pain? Yes: NPRS scale: 0/10 neck, 6/10 back  Pain location: L low back, B thoracic & neck Pain description: tightness and burning (feels muscular)  Aggravating factors: vacuuming, walking the dog  Relieving factors: hooklying figure-4 LTR, flexion stretch     Are you having pain? Yes: NPRS scale: 4/10   Pain location: L anterior knee/peripatellar  Pain description: grabbing, throbbing, popping, burning, soreness and aching depending on the activity  Aggravating factors: only when knee pops   Relieving factors: ice, standing still, ibuprofen , Doterrra Deep Blue    PERTINENT HISTORY:  Osteoarthritis - multiple joints, anxiety, depression, GERD, headache, thyroid  disease - hypothyroidism, urinary incontinence, SVT, lumbar DDD, chronic LBP, sciatica  PRECAUTIONS: Fall  RED FLAGS: Bowel or bladder incontinence: Yes: bladder issues since her hysterectomy  WEIGHT BEARING RESTRICTIONS: No  FALLS:  Has patient fallen in last 6 months? No, 2 falls in early 2024  LIVING ENVIRONMENT: Lives with: lives with their family (lives downstairs from mother) Lives in: House/apartment Stairs: Yes: Internal: 13 steps; on right going up and External: 5 steps; on right going up Has following equipment at home: Single point cane, Walker - 2 wheeled, shower chair, and pedal bike  OCCUPATION: Out of work since Dec 2021  PLOF: Independent and Leisure: walking the dog, washing dishes, light cooking, driving for her mother's appts, relatively sedentary   PATIENT GOALS: "To be able to get up and down from a squat and be able to crawl."   OBJECTIVE: (objective measures completed at initial evaluation unless otherwise dated)  DIAGNOSTIC FINDINGS:  04/05/23 - MR lumbar spine: IMPRESSION: 1. Chronic grade 1 anterolisthesis of L4 on L5 appears mildly progressed since 2023 radiographs, with mild disc but moderate to severe facet arthropathy. Up to mild  bilateral L4 foraminal stenosis. No spinal stenosis. 2. Generally mild for age lumbar spine degeneration elsewhere and no convincing neural impingement. Although up to moderate facet hypertrophy elsewhere, subtle retrolisthesis at L2-L3. 3. Mild acute on chronic lower thoracic endplate degeneration at T10-T11. Subtle anterolisthesis there.  10/16/22 - XR Left & Right knees: No medial or lateral compartment narrowing was noted.  Moderate patellofemoral narrowing was noted.   Impression: These findings are suggestive of moderate chondromalacia  patella of the knee.   10/16/22 - XR B hips:  No hip joint narrowing was noted.  No chondrocalcinosis was noted.   Osteoarthritic changes with mild sclerosis was noted in the SI joints.   Impression: Unremarkable x-rays of the hip joints.   PATIENT SURVEYS:  LEFS 12 / 80 = 15.0 %  05/14/23: Modified Oswestry  24 / 50 = 48.0 %  LEFS  43 / 80 = 53.8 %  COGNITION: Overall cognitive status: Within functional limits for tasks assessed  SENSATION: Intermittent B LE numbness and tingling    EDEMA:  Not normally an issue  POSTURE:  rounded shoulders, forward head, flexed trunk, but knee alignment relatively neutral  PALPATION: decreased patellar mobility L>R with poor VMO activation on quad set  05/14/23 Increased muscle tension & TTP in upper thoracic paraspinals, rhomboids, upper traps, LS; B upper glutes  MUSCLE LENGTH: Hamstrings: mod tight R>L; 05/14/23 slight tight R vs mild tight on L ITB: mild/mod tight R>L; 05/14/23 slight tight L=R Piriformis: mild/mod tight R>L Hip flexors: mild/mod tight R>L Quads: mod tight R>L Heelcord: mod tight B QL: mod tight L>R - 05/14/23  LOWER EXTREMITY ROM:  Active ROM Right eval Left eval  Knee flexion 138 141  Knee extension -2 / 0* -2 / 0*  (Blank rows = not tested, * = supported knee extension)  LOWER EXTREMITY MMT:  MMT Right eval Left eval R 05/14/23 L 05/14/23  Hip flexion 3+ 3+ 4 4  Hip  extension 4 4 4- 4  Hip abduction 4- 4- 4 4-  Hip adduction 4 4 4+ 4-  Hip internal rotation 4- 4 4- 4-  Hip external rotation 4- 4- 4+ 4  Knee flexion 4 4 5  4+  Knee extension 3+ 4- 5 5  Ankle dorsiflexion 4 4 4+ 4+  Ankle plantarflexion 4- (7 SLS HR) 4- (6 SLS HR)    Ankle inversion      Ankle eversion       (Blank rows = not tested)  LOWER EXTREMITY SPECIAL TESTS:  Hip special tests: Ober's test: positive on R Knee special tests: Lateral pull sign: positive B and Patellafemoral grind test: positive B  FUNCTIONAL TESTS: (03/24/23) 5 times sit to stand: 20.03 sec; >15 sec indicates recurrent fall risk 10 meter walk test: 10.46 sec; Gait speed = 3.14 ft/sec, >2.62 ft/sec indicates community ambulator  Functional gait assessment: 19/30; 19-24 = medium risk fall   GAIT: Distance walked: clinic distances Assistive device utilized: Environmental consultant - 2 wheeled Level of assistance: Modified independence Gait pattern: lateral lean- Right and trunk flexed Comments: RW height adjusted 1 notch taller and cues provided for better upright posture and RW proximity  LUMBAR ROM:   Active  05/14/23  Flexion Ankle level, stretch  Extension WFL, upper buttock  Right lateral flexion Fibular head  Left lateral flexion Fibular head, feels stretch  Right rotation WFL, cramp in shoulder  Left rotation WFL   (Blank rows = not tested)   TODAY'S TREATMENT:  05/28/23 Therapeutic Exercise: Bike L2x29min S/L clamshells RTB x 10 B  NEUROMUSCULAR RE-EDUCATION:  Standing rows RTB 2x10 Standing shoulder ext RTB 2x10 Standing pallof press RTB 2x10 B Attempted quadruped arm raise but too much pressure on knees Bridges with RTB 10x5" Supine march + TrA RTB 10x3" B  05/21/23 Nustep L5x79min UE/LE Knee flexion 15lb 2x10 BLE Knee extension 10lb 2x10 BLE Standing rows RTB 2 x 10 Standing pallof press RTB x 15 bil   [facility] PT Assessment - 05/21/23 0001       Functional Gait  Assessment   Gait Level Surface  Walks 20 ft in less than 5.5 sec, no assistive devices, good speed, no evidence for imbalance, normal gait pattern, deviates no more than 6 in outside of the 12 in walkway width.    Change in Gait Speed Able to smoothly change walking speed without loss of balance or gait deviation. Deviate no more than 6 in outside of the 12 in walkway width.  Gait with Horizontal Head Turns Performs head turns smoothly with slight change in gait velocity (eg, minor disruption to smooth gait path), deviates 6-10 in outside 12 in walkway width, or uses an assistive device.    Gait with Vertical Head Turns Performs head turns with no change in gait. Deviates no more than 6 in outside 12 in walkway width.    Gait and Pivot Turn Pivot turns safely within 3 sec and stops quickly with no loss of balance.    Step Over Obstacle Is able to step over 2 stacked shoe boxes taped together (9 in total height) without changing gait speed. No evidence of imbalance.    Gait with Narrow Base of Support Is able to ambulate for 10 steps heel to toe with no staggering.    Gait with Eyes Closed Walks 20 ft, uses assistive device, slower speed, mild gait deviations, deviates 6-10 in outside 12 in walkway width. Ambulates 20 ft in less than 9 sec but greater than 7 sec.    Ambulating Backwards Walks 20 ft, uses assistive device, slower speed, mild gait deviations, deviates 6-10 in outside 12 in walkway width.    Steps Alternating feet, no rail.    Total Score 27             05/14/23 - Re-Eval for Lumbar Spine / Recert of B knee pain Modified Oswestry  24 / 50 = 48.0 %  LEFS  43 / 80 = 53.8 %  SELF CARE:  Reviewed back eval findings and role of PT in addressing identified deficits as well as instruction in proper sitting posture to reduce low back strain and LE radiculopathy.    THERAPEUTIC EXERCISE: To improve strength, ROM, and flexibility.  Demonstration, verbal and tactile cues throughout for technique. Brief review of prior  HEP POE x 30' Prone press up - tight across lower belly; feels good in back but feels pressure in shoulders x 30' Side-lying thoracic rotation w/ open book x 5 - noted cramps in shoulder/scapular Hooklying lumbar rotation x 5 Hooklying sequential march x 3   04/30/23 Therapeutic Exercise: to improve strength, ROM, and flexibility  Bike L2x30min Open book in S/L reviewed LTR reviewed bil   Manual Therapy: to decrease muscle spasm, pain and improve mobility.  IASTM to TL junction, R glutes, lumbar paraspinals   04/23/23 Therapeutic Exercise: to improve strength, ROM, and flexibility  Nustep L5x34min UE/LE Standing hip abduction x 10  Standing hip extension x 10  Seated LAQ + ball squeeze x 10 bil Supine SLR with ER x 10- pt asked to reviewed S/L clamshell YTB 2 x 10 bil Bridges with TrA x 10 Supine LE raise and lower     PATIENT EDUCATION:  Education details: HEP update - rows and pallof press  Person educated: Patient Education method: Explanation, Demonstration, Verbal cues, Handouts, and MedBridgeGO app updated  Education comprehension: verbalized understanding, returned demonstration, verbal cues required, and needs further education  HOME EXERCISE PROGRAM: *Pt using MedBridgeGO app  Access Code: [email or web address] URL: [email or web address] Date: 05/21/2023 Prepared by: Earsel Shouse  Exercises - Supine Hamstring Stretch with Strap  - 2 x daily - 7 x weekly - 3 reps - 30 sec hold - Supine Iliotibial Band Stretch with Strap  - 2 x daily - 7 x weekly - 3 reps - 30 sec hold - Prone Hip Flexor & Quad Stretch with Strap  - 2 x daily - 7 x weekly - 3 reps - 30 sec hold - Supine  Hip Adduction Isometric with Ball  - 2 x daily - 7 x weekly - 2 sets - 10 reps - 5 sec hold - Active Straight Leg Raise with Quad Set  - 2 x daily - 7 x weekly - 2 sets - 10 reps - 3-5 sec hold - Straight Leg Raise with External Rotation  - 2 x daily - 7 x weekly - 1-2 sets - 10 reps - 3 sec  hold - Clamshell with Resistance  - 1 x daily - 7 x weekly - 2 sets - 10 reps - Supine Lower Trunk Rotation  - 1-2 x daily - 7 x weekly - 5 reps - 10 sec hold - Sidelying Thoracic Rotation with Open Book  - 1-2 x daily - 7 x weekly - 10 reps - 5 sec hold - Hooklying Sequential Leg March and Lower  - 1 x daily - 7 x weekly - 2 sets - 10 reps - 3 sec hold - Standing Shoulder Row with Anchored Resistance  - 1 x daily - 3 x weekly - 2 sets - 10 reps - Standing Anti-Rotation Press with Anchored Resistance  - 1 x daily - 3 x weekly - 2 sets - 10 reps  Patient Education - Office Posture   ASSESSMENT:  CLINICAL IMPRESSION: Pt tolerated the progression of exercises well. Needed clarification on how to set-up rows and pallof press putting RTB in doorway. Cues to keep core engaged with marching and bridges. Focused primarily on postural alignment and core stabilization.  Amanda Maynard will benefit from continued skilled PT to address back issues and ongoing knee deficits to improve mobility and activity tolerance with decreased pain interference.   OBJECTIVE IMPAIRMENTS: Abnormal gait, decreased activity tolerance, decreased balance, decreased endurance, decreased knowledge of condition, decreased knowledge of use of DME, decreased mobility, difficulty walking, decreased strength, hypomobility, increased fascial restrictions, impaired perceived functional ability, increased muscle spasms, impaired flexibility, impaired sensation, improper body mechanics, postural dysfunction, and pain.   ACTIVITY LIMITATIONS: carrying, lifting, bending, sitting, standing, squatting, sleeping, stairs, transfers, bed mobility, toileting, locomotion level, and caring for others  PARTICIPATION LIMITATIONS: meal prep, cleaning, laundry, driving, shopping, community activity, and walking her dog  PERSONAL FACTORS: Age, Fitness, Past/current experiences, Time since onset of injury/illness/exacerbation, and 3+ comorbidities:  Osteoarthritis - multiple joints, anxiety, depression, GERD, headache, thyroid  disease - hypothyroidism, urinary incontinence, SVT, lumbar DDD, chronic LBP, sciatica  are also affecting patient's functional outcome.   REHAB POTENTIAL: Good  CLINICAL DECISION MAKING: Unstable/unpredictable  EVALUATION COMPLEXITY: High   GOALS: Goals reviewed with patient? Yes  SHORT TERM GOALS: Target date:  04/16/2023  Patient will be independent with initial HEP. Baseline:  Goal status: MET - 04/23/23  2.  Patient will report at least 25% improvement in B knee pain to improve QOL. Baseline: 5/10 on eval, at worst 10/10  Goal status: MET - 04/23/23  LONG TERM GOALS: Target date: 05/14/2023; Extended to 07/09/2023  Patient will be independent with advanced/ongoing HEP to improve outcomes and carryover.  Baseline:  Goal status: IN PROGRESS - 05/14/23 - met for current HEP  2.  Patient will report at least 50-75% improvement in B knee pain to improve QOL. Baseline: 5/10 currently, at worst 10/10  Goal status: IN PROGRESS - 05/14/23 - 80% improvement in B knee pain  3.  Patient will demonstrate improved B LE strength to >/= 4+/5 for improved stability and ease of mobility. Baseline: Refer to above LE MMT table Goal status: PARTIALLY MET - 05/14/23  4.  Patient will be able to ambulate 600' with or w/o LRAD and normal gait pattern without increased pain to access community.  Baseline: Antalgic gait with flexed trunk using RW Goal status: IN PROGRESS - 05/14/23 - still using RW due to feeling of instability/uneven balance since ESI  5. Patient will be able to ascend/descend stairs with 1 HR and reciprocal step pattern safely to access home and community.  Baseline: NT, but pt reports severe pain with stair negotiation at home Goal status: IN PROGRESS - 05/14/23 - pt reports stairs are still painful but she is able to approach stairs reciprocally  6.  Patient will report ability to squat sufficient to complete  normal daily tasks w/o limitation due to pain or weakness. Baseline:  Goal status: IN PROGRESS - 05/14/23 - able to complete partial squat w/o increased pain but cues necessary for proper alignment and posterior weight shift at hips  7.  Patient will report >/= 25/80 on LEFS (MCID = 9 pts) to demonstrate improved functional ability. Baseline: 12 / 80 = 15.0 % Goal status: MET - 05/14/23 - 43 / 80 = 53.8 %  8.  Patient will demonstrate at least 23/30 on FGA to decrease risk of falls. Baseline: 19/30 (03/24/23) Goal status: MET - 05/21/23   9.  Patient will report at least 50-75% improvement in back pain to improve QOL. Baseline: 7/10 (05/14/23) Goal status: IN PROGRESS- 05/28/23 pt reports 70% improvement  10.  Patient will report </= 36% on modified Oswestry to demonstrate improved functional ability. Baseline: 24 / 50 = 48.0 % (05/14/23) Goal status: INITIAL   PLAN:  PT FREQUENCY: 1-2x/week  PT DURATION: 8 weeks  PLANNED INTERVENTIONS: 97164- PT Re-evaluation, 97110-Therapeutic exercises, 97530- Therapeutic activity, 97112- Neuromuscular re-education, 97535- Self Care, 95621- Manual therapy, [phone]- Gait training, [phone]- Aquatic Therapy, [phone]- Electrical stimulation (unattended), [phone]- Electrical stimulation (manual), 97016- Vasopneumatic device, [id number]- Ultrasound, 44010- Ionotophoresis 4mg /ml Dexamethasone, Patient/Family education, Balance training, Stair training, Taping, Dry Needling, Joint mobilization, DME instructions, Cryotherapy, Moist heat, and 97750- Physical Performance Test   ([place] not covered by [employer])  PLAN FOR NEXT SESSION: work on lumbar flexibility and stretching; progress core and proximal LE strengthening - review/update HEP accordingly; MT +/- DN for for thoracolumbar paraspinals and upper glutes; possible trial of kinesiotaping for chondromalacia patella  Samuella Crocker, PTA 05/28/2023, 11:07 AM

## 2023-05-31 ENCOUNTER — Encounter: Payer: Self-pay | Admitting: Family Medicine

## 2023-06-04 ENCOUNTER — Other Ambulatory Visit (HOSPITAL_BASED_OUTPATIENT_CLINIC_OR_DEPARTMENT_OTHER): Payer: Self-pay

## 2023-06-04 ENCOUNTER — Encounter: Payer: Self-pay | Admitting: Internal Medicine

## 2023-06-04 ENCOUNTER — Ambulatory Visit: Admitting: Internal Medicine

## 2023-06-04 ENCOUNTER — Ambulatory Visit: Admitting: Physical Therapy

## 2023-06-04 VITALS — BP 126/70 | HR 63 | Temp 98.0°F | Resp 16 | Ht 63.0 in | Wt 192.0 lb

## 2023-06-04 DIAGNOSIS — W57XXXA Bitten or stung by nonvenomous insect and other nonvenomous arthropods, initial encounter: Secondary | ICD-10-CM

## 2023-06-04 DIAGNOSIS — S80862A Insect bite (nonvenomous), left lower leg, initial encounter: Secondary | ICD-10-CM | POA: Diagnosis not present

## 2023-06-04 MED ORDER — DOXYCYCLINE HYCLATE 100 MG PO TABS
100.0000 mg | ORAL_TABLET | Freq: Two times a day (BID) | ORAL | 0 refills | Status: DC
  Filled 2023-06-04: qty 20, 10d supply, fill #0

## 2023-06-04 NOTE — Progress Notes (Signed)
   Subjective:    Patient ID: Amanda Maynard, female    DOB: Aug 06, 1960, 63 y.o.   MRN: [id number]  DOS:  06/04/2023 Type of visit - description: Acute  On 05/17/2023 she found a tick at the left posterior knee. She acquired it in [place], [place] . It did not look engorged, was small and brown in color. She is concerned about Lyme disease. Since she removed it, the area is slightly itchy and is leaving a scar.  Denies fever or chills.  No unusual myalgias.  No rash.  No headache.  Also, both ears feel like there is water in them.  Denies pain or discharge  Review of Systems See above   Past Medical History:  Diagnosis Date   Allergy    Anxiety    Arthritis    Chicken pox    Depression    Diverticulitis    Emphysema of lung (HCC)    GERD (gastroesophageal reflux disease)    Headache    History of colon polyps    Jaundice    Lichen planopilaris    Thyroid  disease    Urinary incontinence     Past Surgical History:  Procedure Laterality Date   ABDOMINAL HYSTERECTOMY  2012   APPENDECTOMY  2009   COLONOSCOPY  12/02/2016   Dr. Randle Butler. Moderate diverticulosis of the ascending colon, transverse colon, descending colon and sigmoid colon, increase in vascularity of the rectum. External hemorrhoids.   COLONOSCOPY  08/09/2013   Dr. Randle Butler. Moderate diverticulosis of the hepatic flexure, transverse colon, splenic flexure, descending colon and sigmoid colon. Polyp (7mm) in the proximal sigmoid colon. (polypectomy). External hemorrhoids.   ESOPHAGOGASTRODUODENOSCOPY  08/09/2013   Dr. Randle Butler. Normal stomach. Normal duodenum. Grade 1 esophagitis was seen in the lower third of the esophagus.    Current Outpatient Medications  Medication Instructions   escitalopram  (LEXAPRO ) 10 mg, Oral, Daily   ibuprofen  (ADVIL ) 800 mg, Oral, Every 8 hours PRN   pantoprazole  (PROTONIX ) 40 MG tablet Take 1 tablet (40 mg total) by mouth as needed for indigestion.   rosuvastatin   (CRESTOR ) 10 mg, Oral, Daily   Synthroid  75 mcg, Oral, Daily before breakfast   UNABLE TO FIND Med Name: Lipozem       Objective:   Physical Exam BP 126/70   Pulse 63   Temp 98 F (36.7 C) (Oral)   Resp 16   Ht 5\' 3"  (1.6 m)   Wt 192 lb (87.1 kg)   SpO2 96%   BMI 34.01 kg/m     General:   Well developed, NAD, BMI noted. HEENT:  Normocephalic . Face symmetric, atraumatic Ears: Normal canals and TMs. Skin: See picture, left knee, has a 1 cm round skin lesion, borders are slightly red. Neurologic:  alert & oriented X3.  Speech normal, gait appropriate for age and unassisted Psych--  Cognition and judgment appear intact.  Cooperative with normal attention span and concentration.  Behavior appropriate. No anxious or depressed appearing.   Assessment     63 year old female, PMH includes high cholesterol, hypothyroidism, osteopenia, SVT presents with:  Tick bite: As described above, patient is concerned about Lyme, she still has some redness in and round fashion.  She is concerned about Lyme.  I will err in the side of caution and prescribe doxycycline , also recommend hydrocortisone cream, to let me know if not better.  Reports like something is in her ears, exam is negative, recommend observation.

## 2023-06-04 NOTE — Patient Instructions (Signed)
 Take antibiotic as prescribed  Apply over-the-counter hydrocortisone cream 1%: Twice daily.  If you are not gradually better let us  know

## 2023-06-09 ENCOUNTER — Ambulatory Visit: Admitting: Family Medicine

## 2023-06-11 ENCOUNTER — Ambulatory Visit

## 2023-06-11 ENCOUNTER — Other Ambulatory Visit (HOSPITAL_BASED_OUTPATIENT_CLINIC_OR_DEPARTMENT_OTHER): Payer: Self-pay

## 2023-06-18 ENCOUNTER — Encounter: Payer: Self-pay | Admitting: Physical Therapy

## 2023-06-18 ENCOUNTER — Ambulatory Visit: Attending: Physical Medicine and Rehabilitation | Admitting: Physical Therapy

## 2023-06-18 DIAGNOSIS — M5459 Other low back pain: Secondary | ICD-10-CM | POA: Diagnosis not present

## 2023-06-18 DIAGNOSIS — G8929 Other chronic pain: Secondary | ICD-10-CM | POA: Diagnosis not present

## 2023-06-18 DIAGNOSIS — M25562 Pain in left knee: Secondary | ICD-10-CM | POA: Diagnosis not present

## 2023-06-18 DIAGNOSIS — M62830 Muscle spasm of back: Secondary | ICD-10-CM | POA: Diagnosis not present

## 2023-06-18 DIAGNOSIS — R2689 Other abnormalities of gait and mobility: Secondary | ICD-10-CM

## 2023-06-18 DIAGNOSIS — M6281 Muscle weakness (generalized): Secondary | ICD-10-CM | POA: Diagnosis not present

## 2023-06-18 DIAGNOSIS — M25561 Pain in right knee: Secondary | ICD-10-CM | POA: Diagnosis not present

## 2023-06-18 NOTE — Therapy (Signed)
 OUTPATIENT PHYSICAL THERAPY TREATMENT     Patient Name: Amanda Maynard MRN: [id number] DOB:08-23-1960, 63 y.o., female Today's Date: 06/18/2023   END OF SESSION:  PT End of Session - 06/18/23 1111     Visit Number 9    Date for PT Re-Evaluation 07/09/23    Authorization Type [employer]    PT Start Time 1111   Pt arrived late   PT Stop Time 1146    PT Time Calculation (min) 35 min    Activity Tolerance Patient tolerated treatment well    Behavior During Therapy [facility] for tasks assessed/performed                    Past Medical History:  Diagnosis Date   Allergy    Anxiety    Arthritis    Chicken pox    Depression    Diverticulitis    Emphysema of lung (HCC)    GERD (gastroesophageal reflux disease)    Headache    History of colon polyps    Jaundice    Lichen planopilaris    Thyroid  disease    Urinary incontinence    Past Surgical History:  Procedure Laterality Date   ABDOMINAL HYSTERECTOMY  2012   APPENDECTOMY  2009   COLONOSCOPY  12/02/2016   Dr. Randle Butler. Moderate diverticulosis of the ascending colon, transverse colon, descending colon and sigmoid colon, increase in vascularity of the rectum. External hemorrhoids.   COLONOSCOPY  08/09/2013   Dr. Randle Butler. Moderate diverticulosis of the hepatic flexure, transverse colon, splenic flexure, descending colon and sigmoid colon. Polyp (7mm) in the proximal sigmoid colon. (polypectomy). External hemorrhoids.   ESOPHAGOGASTRODUODENOSCOPY  08/09/2013   Dr. Randle Butler. Normal stomach. Normal duodenum. Grade 1 esophagitis was seen in the lower third of the esophagus.   Patient Active Problem List   Diagnosis Date Noted   Osteopenia 05/01/2023   Supraventricular tachycardia (HCC) 12/13/2022   Palpitations 12/13/2022   Chondromalacia of both patellae 12/05/2022   Primary osteoarthritis of both hands 12/05/2022   Degeneration of intervertebral disc of lumbar region without discogenic back pain or lower  extremity pain 12/05/2022   Arthritis of finger of left hand 05/10/2022   Bilateral hand pain 05/06/2022   Migraine 04/10/2022   Anxiety and depression 02/04/2022   Chronic lower back pain 12/05/2021   Chest pain 10/10/2021   Hepatitis C antibody test positive 12/13/2020   Hypothyroidism (acquired) 12/13/2020   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   Arthritis 02/05/2020   Sciatica 02/05/2020    PCP: Kaylee Partridge, MD   REFERRING PROVIDER: Darryll Eng, NP (back); Wes Hamman, MD (knees)  REFERRING DIAG:  M54.16 (ICD-10-CM) - Lumbar radiculopathy  M54.42,M54.41,G89.29 (ICD-10-CM) - Chronic bilateral low back pain with bilateral sciatica  G89.4 (ICD-10-CM) - Chronic pain syndrome  M25.561,M25.562,G89.29 (ICD-10-CM) - Chronic pain of both knee   THERAPY DIAG:  Other low back pain  Muscle spasm of back  Muscle weakness (generalized)  Other abnormalities of gait and mobility  Chronic pain of both knees  RATIONALE FOR EVALUATION AND TREATMENT: Rehabilitation  ONSET DATE: Chronic for knee and back  NEXT MD VISIT:  None scheduled / PRN with both referring providers    SUBJECTIVE:  SUBJECTIVE STATEMENT: Pt reports that she was bit by a tick mid April and hasn't feel well since. She admits to poor compliance with her HEP but states she has keep busy relocating her plants from inside to outside as well as repotting.  She has been trying to use the lifting techniques recommended by PT and feels like this is helping.  EVAL (for knees): Pt reports she has pain everywhere and is hoping to get on disability.  She is here for her B knee pain but reports her back pain is the worst - has an appt at [facility] to [facility] someone for her back this afternoon.  Also has pain in most of her  joints, esp hands.  She feels like her knee pain has been worse with the cold weather this winter.  She reports burning and popping in B knees with severe pain when climbing stairs.  She reports times where she wakes up with B LE numb, progressing to tingling/pin-and-needles.   PAIN:  Are you having pain? Yes: NPRS scale: 9/10 neck & mid/low back  Pain location: L low back, B thoracic & neck Pain description: tightness and burning (feels muscular)  Aggravating factors: vacuuming, walking the dog  Relieving factors: hooklying figure-4 LTR, flexion stretch    Are you having pain? Yes: NPRS scale: 6/10   Pain location: L anterior knee/peripatellar  Pain description: grabbing, throbbing, popping, burning, soreness and aching depending on the activity  Aggravating factors: only when knee pops   Relieving factors: ice, standing still, ibuprofen , Doterrra Deep Blue    PERTINENT HISTORY:  Osteoarthritis - multiple joints, anxiety, depression, GERD, headache, thyroid  disease - hypothyroidism, urinary incontinence, SVT, lumbar DDD, chronic LBP, sciatica  PRECAUTIONS: Fall  RED FLAGS: Bowel or bladder incontinence: Yes: bladder issues since her hysterectomy  WEIGHT BEARING RESTRICTIONS: No  FALLS:  Has patient fallen in last 6 months? No, 2 falls in early 2024  LIVING ENVIRONMENT: Lives with: lives with their family (lives downstairs from mother) Lives in: House/apartment Stairs: Yes: Internal: 13 steps; on right going up and External: 5 steps; on right going up Has following equipment at home: Single point cane, Walker - 2 wheeled, shower chair, and pedal bike  OCCUPATION: Out of work since Dec 2021  PLOF: Independent and Leisure: walking the dog, washing dishes, light cooking, driving for her mother's appts, relatively sedentary   PATIENT GOALS: "To be able to get up and down from a squat and be able to crawl."   OBJECTIVE: (objective measures completed at initial evaluation unless  otherwise dated)  DIAGNOSTIC FINDINGS:  04/05/23 - MR lumbar spine: IMPRESSION: 1. Chronic grade 1 anterolisthesis of L4 on L5 appears mildly progressed since 2023 radiographs, with mild disc but moderate to severe facet arthropathy. Up to mild bilateral L4 foraminal stenosis. No spinal stenosis. 2. Generally mild for age lumbar spine degeneration elsewhere and no convincing neural impingement. Although up to moderate facet hypertrophy elsewhere, subtle retrolisthesis at L2-L3. 3. Mild acute on chronic lower thoracic endplate degeneration at T10-T11. Subtle anterolisthesis there.  10/16/22 - XR Left & Right knees: No medial or lateral compartment narrowing was noted.  Moderate patellofemoral narrowing was noted.   Impression: These findings are suggestive of moderate chondromalacia  patella of the knee.   10/16/22 - XR B hips:  No hip joint narrowing was noted.  No chondrocalcinosis was noted.   Osteoarthritic changes with mild sclerosis was noted in the SI joints.   Impression: Unremarkable x-rays of the hip  joints.   PATIENT SURVEYS:  LEFS 12 / 80 = 15.0 %  05/14/23: Modified Oswestry  24 / 50 = 48.0 %  LEFS  43 / 80 = 53.8 %  COGNITION: Overall cognitive status: Within functional limits for tasks assessed    SENSATION: Intermittent B LE numbness and tingling    EDEMA:  Not normally an issue  POSTURE:  rounded shoulders, forward head, flexed trunk, but knee alignment relatively neutral  PALPATION: decreased patellar mobility L>R with poor VMO activation on quad set  05/14/23 Increased muscle tension & TTP in upper thoracic paraspinals, rhomboids, upper traps, LS; B upper glutes  MUSCLE LENGTH: Hamstrings: mod tight R>L; 05/14/23 slight tight R vs mild tight on L ITB: mild/mod tight R>L; 05/14/23 slight tight L=R Piriformis: mild/mod tight R>L Hip flexors: mild/mod tight R>L Quads: mod tight R>L Heelcord: mod tight B QL: mod tight L>R - 05/14/23  LOWER EXTREMITY  ROM:  Active ROM Right eval Left eval  Knee flexion 138 141  Knee extension -2 / 0* -2 / 0*  (Blank rows = not tested, * = supported knee extension)  LOWER EXTREMITY MMT:  MMT Right eval Left eval R 05/14/23 L 05/14/23  Hip flexion 3+ 3+ 4 4  Hip extension 4 4 4- 4  Hip abduction 4- 4- 4 4-  Hip adduction 4 4 4+ 4-  Hip internal rotation 4- 4 4- 4-  Hip external rotation 4- 4- 4+ 4  Knee flexion 4 4 5  4+  Knee extension 3+ 4- 5 5  Ankle dorsiflexion 4 4 4+ 4+  Ankle plantarflexion 4- (7 SLS HR) 4- (6 SLS HR)    Ankle inversion      Ankle eversion       (Blank rows = not tested)  LOWER EXTREMITY SPECIAL TESTS:  Hip special tests: Ober's test: positive on R Knee special tests: Lateral pull sign: positive B and Patellafemoral grind test: positive B  FUNCTIONAL TESTS: (03/24/23) 5 times sit to stand: 20.03 sec; >15 sec indicates recurrent fall risk 10 meter walk test: 10.46 sec; Gait speed = 3.14 ft/sec, >2.62 ft/sec indicates community ambulator  Functional gait assessment: 19/30; 19-24 = medium risk fall   GAIT: Distance walked: clinic distances Assistive device utilized: Environmental consultant - 2 wheeled Level of assistance: Modified independence Gait pattern: lateral lean- Right and trunk flexed Comments: RW height adjusted 1 notch taller and cues provided for better upright posture and RW proximity  LUMBAR ROM:   Active  05/14/23  Flexion Ankle level, stretch  Extension WFL, upper buttock  Right lateral flexion Fibular head  Left lateral flexion Fibular head, feels stretch  Right rotation WFL, cramp in shoulder  Left rotation WFL   (Blank rows = not tested)   TODAY'S TREATMENT:   06/18/23 THERAPEUTIC EXERCISE: To improve strength, endurance, ROM, and flexibility.  Demonstration, verbal and tactile cues throughout for technique.  NuStep L5 x 6 min UE/LE 3-way lumbar flexion stretch with hands on green Pball 3 x 30" each Hooklying LTR 2 x 20-30", followed by 10 reps w/o pause S/L  open book stretch 10 x 3-5" bil  NEUROMUSCULAR RE-EDUCATION: To improve coordination, kinesthesia, and posture.  Hooklying TrA + sequential march x 10 Standing RTB pallof press x 10 bil   05/28/23 Therapeutic Exercise: Bike L2x104min S/L clamshells RTB x 10 B  NEUROMUSCULAR RE-EDUCATION:  Standing rows RTB 2x10 Standing shoulder ext RTB 2x10 Standing pallof press RTB 2x10 B Attempted quadruped arm raise but too much pressure on  knees Bridges with RTB 10x5" Supine march + TrA RTB 10x3" B   05/21/23 Nustep L5x56min UE/LE Knee flexion 15lb 2x10 BLE Knee extension 10lb 2x10 BLE Standing rows RTB 2 x 10 Standing pallof press RTB x 15 bil  Functional Gait  Assessment  Gait Level Surface Walks 20 ft in less than 5.5 sec, no assistive devices, good speed, no evidence for imbalance, normal gait pattern, deviates no more than 6 in outside of the 12 in walkway width.   Change in Gait Speed Able to smoothly change walking speed without loss of balance or gait deviation. Deviate no more than 6 in outside of the 12 in walkway width.   Gait with Horizontal Head Turns Performs head turns smoothly with slight change in gait velocity (eg, minor disruption to smooth gait path), deviates 6-10 in outside 12 in walkway width, or uses an assistive device.   Gait with Vertical Head Turns Performs head turns with no change in gait. Deviates no more than 6 in outside 12 in walkway width.   Gait and Pivot Turn Pivot turns safely within 3 sec and stops quickly with no loss of balance.   Step Over Obstacle Is able to step over 2 stacked shoe boxes taped together (9 in total height) without changing gait speed. No evidence of imbalance.   Gait with Narrow Base of Support Is able to ambulate for 10 steps heel to toe with no staggering.   Gait with Eyes Closed Walks 20 ft, uses assistive device, slower speed, mild gait deviations, deviates 6-10 in outside 12 in walkway width. Ambulates 20 ft in less than 9 sec but  greater than 7 sec.   Ambulating Backwards Walks 20 ft, uses assistive device, slower speed, mild gait deviations, deviates 6-10 in outside 12 in walkway width.   Steps Alternating feet, no rail.   Total Score 27       05/14/23 - Re-Eval for Lumbar Spine / Recert of B knee pain Modified Oswestry  24 / 50 = 48.0 %  LEFS  43 / 80 = 53.8 %  SELF CARE:  Reviewed back eval findings and role of PT in addressing identified deficits as well as instruction in proper sitting posture to reduce low back strain and LE radiculopathy.    THERAPEUTIC EXERCISE: To improve strength, ROM, and flexibility.  Demonstration, verbal and tactile cues throughout for technique. Brief review of prior HEP POE x 30' Prone press up - tight across lower belly; feels good in back but feels pressure in shoulders x 30' Side-lying thoracic rotation w/ open book x 5 - noted cramps in shoulder/scapular Hooklying lumbar rotation x 5 Hooklying sequential march x 3   PATIENT EDUCATION:  Education details: HEP update - rows and pallof press  Person educated: Patient Education method: Explanation, Demonstration, Verbal cues, Handouts, and MedBridgeGO app updated  Education comprehension: verbalized understanding, returned demonstration, verbal cues required, and needs further education  HOME EXERCISE PROGRAM: *Pt using MedBridgeGO app  Access Code: [email or web address] URL: [email or web address] Date: 06/18/2023 Prepared by: Felecia Hopper  Exercises - Supine Hamstring Stretch with Strap  - 2 x daily - 7 x weekly - 3 reps - 30 sec hold - Supine Iliotibial Band Stretch with Strap  - 2 x daily - 7 x weekly - 3 reps - 30 sec hold - Prone Hip Flexor & Quad Stretch with Strap  - 2 x daily - 7 x weekly - 3 reps - 30 sec hold - Active Straight Leg Raise with  Quad Set  - 2 x daily - 7 x weekly - 2 sets - 10 reps - 3-5 sec hold - Straight Leg Raise with External Rotation  - 2 x daily - 7 x weekly - 1-2 sets - 10 reps - 3 sec  hold - Supine Lower Trunk Rotation  - 1-2 x daily - 7 x weekly - 5 reps - 10 sec hold - Sidelying Thoracic Rotation with Open Book  - 1-2 x daily - 7 x weekly - 10 reps - 5 sec hold - Seated Flexion Stretch with Swiss Ball  - 2 x daily - 7 x weekly - 3 reps - 30 sec hold - Seated Thoracic Flexion and Rotation with Swiss Ball  - 2 x daily - 7 x weekly - 3 reps - 30 sec hold - Supine Hip Adduction Isometric with Ball  - 2 x daily - 7 x weekly - 2 sets - 10 reps - 5 sec hold - Clamshell with Resistance  - 1 x daily - 7 x weekly - 2 sets - 10 reps - Hooklying Sequential Leg March and Lower  - 1 x daily - 7 x weekly - 2 sets - 10 reps - 3 sec hold - Standing Shoulder Row with Anchored Resistance  - 1 x daily - 3 x weekly - 2 sets - 10 reps - Standing Anti-Rotation Press with Anchored Resistance  - 1 x daily - 3 x weekly - 2 sets - 10 reps  Patient Education - Office Posture   ASSESSMENT:  CLINICAL IMPRESSION: Amanda Maynard returns to PT after 3-week absence following a tick bite that has left her feeling very fatigued.  She admits to poor compliance with her HEP during this absence, although has been busy working with her plants, and reports her pain is increased today.  Reviewed core mobilization, stretching and strengthening exercises at pt request, clarifying and updating HEP accordingly.  Session limited by patient's late arrival however Amanda Maynard reported reduction in her pain by end of session.  Emphasized need for regular compliance with HEP and attendance with PT sessions to see more consistent gains/improvement as well as allow for appropriate progression of therapeutic interventions.  Amanda Maynard will benefit from continued skilled PT to address back issues and ongoing knee deficits to improve mobility and activity tolerance with decreased pain interference.   OBJECTIVE IMPAIRMENTS: Abnormal gait, decreased activity tolerance, decreased balance, decreased endurance, decreased knowledge of condition, decreased  knowledge of use of DME, decreased mobility, difficulty walking, decreased strength, hypomobility, increased fascial restrictions, impaired perceived functional ability, increased muscle spasms, impaired flexibility, impaired sensation, improper body mechanics, postural dysfunction, and pain.   ACTIVITY LIMITATIONS: carrying, lifting, bending, sitting, standing, squatting, sleeping, stairs, transfers, bed mobility, toileting, locomotion level, and caring for others  PARTICIPATION LIMITATIONS: meal prep, cleaning, laundry, driving, shopping, community activity, and walking her dog  PERSONAL FACTORS: Age, Fitness, Past/current experiences, Time since onset of injury/illness/exacerbation, and 3+ comorbidities: Osteoarthritis - multiple joints, anxiety, depression, GERD, headache, thyroid  disease - hypothyroidism, urinary incontinence, SVT, lumbar DDD, chronic LBP, sciatica are also affecting patient's functional outcome.   REHAB POTENTIAL: Good  CLINICAL DECISION MAKING: Unstable/unpredictable  EVALUATION COMPLEXITY: High   GOALS: Goals reviewed with patient? Yes  SHORT TERM GOALS: Target date:  04/16/2023  Patient will be independent with initial HEP. Baseline:  Goal status: MET - 04/23/23  2.  Patient will report at least 25% improvement in B knee pain to improve QOL. Baseline: 5/10 on eval, at worst 10/10  Goal status: MET - 04/23/23  LONG TERM GOALS: Target date: 05/14/2023; Extended to 07/09/2023  Patient will be independent with advanced/ongoing HEP to improve outcomes and carryover.  Baseline:  Goal status: IN PROGRESS - 06/18/23 - reviewed and updated today  2.  Patient will report at least 50-75% improvement in B knee pain to improve QOL. Baseline: 5/10 currently, at worst 10/10  Goal status: IN PROGRESS - 05/14/23 - 80% improvement in B knee pain  3.  Patient will demonstrate improved B LE strength to >/= 4+/5 for improved stability and ease of mobility. Baseline: Refer to above  LE MMT table Goal status: PARTIALLY MET - 05/14/23  4.  Patient will be able to ambulate 600' with or w/o LRAD and normal gait pattern without increased pain to access community.  Baseline: Antalgic gait with flexed trunk using RW Goal status: IN PROGRESS - 05/14/23 - still using RW due to feeling of instability/uneven balance since ESI  5. Patient will be able to ascend/descend stairs with 1 HR and reciprocal step pattern safely to access home and community.  Baseline: NT, but pt reports severe pain with stair negotiation at home Goal status: IN PROGRESS - 05/14/23 - pt reports stairs are still painful but she is able to approach stairs reciprocally  6.  Patient will report ability to squat sufficient to complete normal daily tasks w/o limitation due to pain or weakness. Baseline:  Goal status: IN PROGRESS - 05/14/23 - able to complete partial squat w/o increased pain but cues necessary for proper alignment and posterior weight shift at hips  7.  Patient will report >/= 25/80 on LEFS (MCID = 9 pts) to demonstrate improved functional ability. Baseline: 12 / 80 = 15.0 % Goal status: MET - 05/14/23 - 43 / 80 = 53.8 %  8.  Patient will demonstrate at least 23/30 on FGA to decrease risk of falls. Baseline: 19/30 (03/24/23) Goal status: MET - 05/21/23   9.  Patient will report at least 50-75% improvement in back pain to improve QOL. Baseline: 7/10 (05/14/23) Goal status: IN PROGRESS- 05/28/23 pt reports 70% improvement  10.  Patient will report </= 36% on modified Oswestry to demonstrate improved functional ability. Baseline: 24 / 50 = 48.0 % (05/14/23) Goal status: INITIAL   PLAN:  PT FREQUENCY: 1-2x/week  PT DURATION: 8 weeks  PLANNED INTERVENTIONS: 97164- PT Re-evaluation, 97110-Therapeutic exercises, 97530- Therapeutic activity, 97112- Neuromuscular re-education, 97535- Self Care, 40981- Manual therapy, [phone]- Gait training, [phone]- Aquatic Therapy, [phone]- Electrical stimulation (unattended),  [phone]- Electrical stimulation (manual), 97016- Vasopneumatic device, [id number]- Ultrasound, [id number]- Ionotophoresis 4mg /ml Dexamethasone, Patient/Family education, Balance training, Stair training, Taping, Dry Needling, Joint mobilization, DME instructions, Cryotherapy, Moist heat, and 97750- Physical Performance Test  ([place] not covered by [employer])  PLAN FOR NEXT SESSION: continue to update LTG assessment; work on lumbar flexibility and stretching; progress core and proximal LE strengthening - review/update HEP accordingly; MT +/- DN for for thoracolumbar paraspinals and upper glutes; possible trial of kinesiotaping for chondromalacia patella  Francisco Irving, PT 06/18/2023, 1:21 PM

## 2023-06-25 ENCOUNTER — Ambulatory Visit: Payer: 59 | Admitting: Gastroenterology

## 2023-06-25 ENCOUNTER — Encounter: Payer: Self-pay | Admitting: Gastroenterology

## 2023-06-25 ENCOUNTER — Ambulatory Visit

## 2023-06-25 VITALS — BP 120/86 | HR 65 | Ht 63.0 in | Wt 193.2 lb

## 2023-06-25 DIAGNOSIS — K7460 Unspecified cirrhosis of liver: Secondary | ICD-10-CM | POA: Diagnosis not present

## 2023-06-25 DIAGNOSIS — D696 Thrombocytopenia, unspecified: Secondary | ICD-10-CM

## 2023-06-25 DIAGNOSIS — M6281 Muscle weakness (generalized): Secondary | ICD-10-CM | POA: Diagnosis not present

## 2023-06-25 DIAGNOSIS — K219 Gastro-esophageal reflux disease without esophagitis: Secondary | ICD-10-CM

## 2023-06-25 DIAGNOSIS — M5459 Other low back pain: Secondary | ICD-10-CM | POA: Diagnosis not present

## 2023-06-25 DIAGNOSIS — G8929 Other chronic pain: Secondary | ICD-10-CM

## 2023-06-25 DIAGNOSIS — Z8619 Personal history of other infectious and parasitic diseases: Secondary | ICD-10-CM | POA: Diagnosis not present

## 2023-06-25 DIAGNOSIS — M25561 Pain in right knee: Secondary | ICD-10-CM | POA: Diagnosis not present

## 2023-06-25 DIAGNOSIS — M62830 Muscle spasm of back: Secondary | ICD-10-CM

## 2023-06-25 DIAGNOSIS — R2689 Other abnormalities of gait and mobility: Secondary | ICD-10-CM

## 2023-06-25 DIAGNOSIS — M25562 Pain in left knee: Secondary | ICD-10-CM | POA: Diagnosis not present

## 2023-06-25 NOTE — Therapy (Addendum)
 OUTPATIENT PHYSICAL THERAPY TREATMENT / DISCHARGE SUMMARY    Patient Name: Amanda Maynard MRN: [id number] DOB:10/24/1960, 63 y.o., female Today's Date: 06/25/2023   END OF SESSION:  PT End of Session - 06/25/23 1120     Visit Number 10    Date for PT Re-Evaluation 07/09/23    Authorization Type [employer]    PT Start Time 1102    PT Stop Time 1148    PT Time Calculation (min) 46 min    Activity Tolerance Patient tolerated treatment well    Behavior During Therapy WFL for tasks assessed/performed                     Past Medical History:  Diagnosis Date   Allergy    Anxiety    Arthritis    Chicken pox    Depression    Diverticulitis    Emphysema of lung (HCC)    GERD (gastroesophageal reflux disease)    Headache    History of colon polyps    Jaundice    Lichen planopilaris    Thyroid  disease    Urinary incontinence    Past Surgical History:  Procedure Laterality Date   ABDOMINAL HYSTERECTOMY  2012   APPENDECTOMY  2009   COLONOSCOPY  12/02/2016   Dr. Camellia Sinks. Moderate diverticulosis of the ascending colon, transverse colon, descending colon and sigmoid colon, increase in vascularity of the rectum. External hemorrhoids.   COLONOSCOPY  08/09/2013   Dr. Camellia Sinks. Moderate diverticulosis of the hepatic flexure, transverse colon, splenic flexure, descending colon and sigmoid colon. Polyp (7mm) in the proximal sigmoid colon. (polypectomy). External hemorrhoids.   ESOPHAGOGASTRODUODENOSCOPY  08/09/2013   Dr. Camellia Sinks. Normal stomach. Normal duodenum. Grade 1 esophagitis was seen in the lower third of the esophagus.   Patient Active Problem List   Diagnosis Date Noted   Osteopenia 05/01/2023   Supraventricular tachycardia (HCC) 12/13/2022   Palpitations 12/13/2022   Chondromalacia of both patellae 12/05/2022   Primary osteoarthritis of both hands 12/05/2022   Degeneration of intervertebral disc of lumbar region without discogenic back pain or lower  extremity pain 12/05/2022   Arthritis of finger of left hand 05/10/2022   Bilateral hand pain 05/06/2022   Migraine 04/10/2022   Anxiety and depression 02/04/2022   Chronic lower back pain 12/05/2021   Chest pain 10/10/2021   Hepatitis C antibody test positive 12/13/2020   Hypothyroidism (acquired) 12/13/2020   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   Arthritis 02/05/2020   Sciatica 02/05/2020    PCP: Watt Harlene BROCKS, MD   REFERRING PROVIDER: Trudy Duwaine BRAVO, NP (back); Jerri Kay HERO, MD (knees)  REFERRING DIAG:  M54.16 (ICD-10-CM) - Lumbar radiculopathy  M54.42,M54.41,G89.29 (ICD-10-CM) - Chronic bilateral low back pain with bilateral sciatica  G89.4 (ICD-10-CM) - Chronic pain syndrome  M25.561,M25.562,G89.29 (ICD-10-CM) - Chronic pain of both knee   THERAPY DIAG:  Other low back pain  Muscle spasm of back  Muscle weakness (generalized)  Other abnormalities of gait and mobility  Chronic pain of both knees  RATIONALE FOR EVALUATION AND TREATMENT: Rehabilitation  ONSET DATE: Chronic for knee and back  NEXT MD VISIT:  None scheduled / PRN with both referring providers    SUBJECTIVE:  SUBJECTIVE STATEMENT: Pt reports she is having muscle spasm along L posterior knee where the tick bite was.  EVAL (for knees): Pt reports she has pain everywhere and is hoping to get on disability.  She is here for her B knee pain but reports her back pain is the worst - has an appt at [facility] to [facility] someone for her back this afternoon.  Also has pain in most of her joints, esp hands.  She feels like her knee pain has been worse with the cold weather this winter.  She reports burning and popping in B knees with severe pain when climbing stairs.  She reports times where she wakes up with B LE  numb, progressing to tingling/pin-and-needles.   PAIN:  Are you having pain? Yes: NPRS scale: 8/10 neck & mid/low back  Pain location: L low back, B thoracic & neck Pain description: tightness and burning (feels muscular)  Aggravating factors: vacuuming, walking the dog  Relieving factors: hooklying figure-4 LTR, flexion stretch    Are you having pain? Yes: NPRS scale: 6/10   Pain location: L anterior knee/peripatellar  Pain description: grabbing, throbbing, popping, burning, soreness and aching depending on the activity  Aggravating factors: only when knee pops   Relieving factors: ice, standing still, ibuprofen , Doterrra Deep Blue    PERTINENT HISTORY:  Osteoarthritis - multiple joints, anxiety, depression, GERD, headache, thyroid  disease - hypothyroidism, urinary incontinence, SVT, lumbar DDD, chronic LBP, sciatica  PRECAUTIONS: Fall  RED FLAGS: Bowel or bladder incontinence: Yes: bladder issues since her hysterectomy  WEIGHT BEARING RESTRICTIONS: No  FALLS:  Has patient fallen in last 6 months? No, 2 falls in early 2024  LIVING ENVIRONMENT: Lives with: lives with their family (lives downstairs from mother) Lives in: House/apartment Stairs: Yes: Internal: 13 steps; on right going up and External: 5 steps; on right going up Has following equipment at home: Single point cane, Walker - 2 wheeled, shower chair, and pedal bike  OCCUPATION: Out of work since Dec 2021  PLOF: Independent and Leisure: walking the dog, washing dishes, light cooking, driving for her mother's appts, relatively sedentary   PATIENT GOALS: To be able to get up and down from a squat and be able to crawl.   OBJECTIVE: (objective measures completed at initial evaluation unless otherwise dated)  DIAGNOSTIC FINDINGS:  04/05/23 - MR lumbar spine: IMPRESSION: 1. Chronic grade 1 anterolisthesis of L4 on L5 appears mildly progressed since 2023 radiographs, with mild disc but moderate to severe facet  arthropathy. Up to mild bilateral L4 foraminal stenosis. No spinal stenosis. 2. Generally mild for age lumbar spine degeneration elsewhere and no convincing neural impingement. Although up to moderate facet hypertrophy elsewhere, subtle retrolisthesis at L2-L3. 3. Mild acute on chronic lower thoracic endplate degeneration at T10-T11. Subtle anterolisthesis there.  10/16/22 - XR Left & Right knees: No medial or lateral compartment narrowing was noted.  Moderate patellofemoral narrowing was noted.   Impression: These findings are suggestive of moderate chondromalacia  patella of the knee.   10/16/22 - XR B hips:  No hip joint narrowing was noted.  No chondrocalcinosis was noted.   Osteoarthritic changes with mild sclerosis was noted in the SI joints.   Impression: Unremarkable x-rays of the hip joints.   PATIENT SURVEYS:  LEFS 12 / 80 = 15.0 %  05/14/23: Modified Oswestry  24 / 50 = 48.0 %  LEFS  43 / 80 = 53.8 %  COGNITION: Overall cognitive status: Within functional limits for tasks assessed  SENSATION: Intermittent B LE numbness and tingling    EDEMA:  Not normally an issue  POSTURE:  rounded shoulders, forward head, flexed trunk, but knee alignment relatively neutral  PALPATION: decreased patellar mobility L>R with poor VMO activation on quad set  05/14/23 Increased muscle tension & TTP in upper thoracic paraspinals, rhomboids, upper traps, LS; B upper glutes  MUSCLE LENGTH: Hamstrings: mod tight R>L; 05/14/23 slight tight R vs mild tight on L ITB: mild/mod tight R>L; 05/14/23 slight tight L=R Piriformis: mild/mod tight R>L Hip flexors: mild/mod tight R>L Quads: mod tight R>L Heelcord: mod tight B QL: mod tight L>R - 05/14/23  LOWER EXTREMITY ROM:  Active ROM Right eval Left eval  Knee flexion 138 141  Knee extension -2 / 0* -2 / 0*  (Blank rows = not tested, * = supported knee extension)  LOWER EXTREMITY MMT:  MMT Right eval Left eval R 05/14/23 L 05/14/23  Hip  flexion 3+ 3+ 4 4  Hip extension 4 4 4- 4  Hip abduction 4- 4- 4 4-  Hip adduction 4 4 4+ 4-  Hip internal rotation 4- 4 4- 4-  Hip external rotation 4- 4- 4+ 4  Knee flexion 4 4 5  4+  Knee extension 3+ 4- 5 5  Ankle dorsiflexion 4 4 4+ 4+  Ankle plantarflexion 4- (7 SLS HR) 4- (6 SLS HR)    Ankle inversion      Ankle eversion       (Blank rows = not tested)  LOWER EXTREMITY SPECIAL TESTS:  Hip special tests: Ober's test: positive on R Knee special tests: Lateral pull sign: positive B and Patellafemoral grind test: positive B  FUNCTIONAL TESTS: (03/24/23) 5 times sit to stand: 20.03 sec; >15 sec indicates recurrent fall risk 10 meter walk test: 10.46 sec; Gait speed = 3.14 ft/sec, >2.62 ft/sec indicates community ambulator  Functional gait assessment: 19/30; 19-24 = medium risk fall   GAIT: Distance walked: clinic distances Assistive device utilized: Environmental Consultant - 2 wheeled Level of assistance: Modified independence Gait pattern: lateral lean- Right and trunk flexed Comments: RW height adjusted 1 notch taller and cues provided for better upright posture and RW proximity  LUMBAR ROM:   Active  05/14/23  Flexion Ankle level, stretch  Extension WFL, upper buttock  Right lateral flexion Fibular head  Left lateral flexion Fibular head, feels stretch  Right rotation WFL, cramp in shoulder  Left rotation WFL   (Blank rows = not tested)   TODAY'S TREATMENT:  06/25/23 THERAPEUTIC EXERCISE: To improve strength, endurance, ROM, and flexibility.  Demonstration, verbal and tactile cues throughout for technique.  NuStep L5 x 6 min UE/LE Seated L gastroc + HS stretch 2x30 Seated LAQ + ankle DF x 10 Sidelying hip abduction x 10 B  NEUROMUSCULAR RE-EDUCATION: To improve coordination, kinesthesia, and posture.  Standing heel raises x 10 B Narrow BOS with trunk rotations x 10 B Tandem stance 2x20 B Trunk rotation orange pballl x 10 B Bridge orange pball x 5   06/18/23 THERAPEUTIC  EXERCISE: To improve strength, endurance, ROM, and flexibility.  Demonstration, verbal and tactile cues throughout for technique.  NuStep L5 x 6 min UE/LE 3-way lumbar flexion stretch with hands on green Pball 3 x 30 each Hooklying LTR 2 x 20-30, followed by 10 reps w/o pause S/L open book stretch 10 x 3-5 bil  NEUROMUSCULAR RE-EDUCATION: To improve coordination, kinesthesia, and posture.  Hooklying TrA + sequential march x 10 Standing RTB pallof press x 10 bil  05/28/23 Therapeutic Exercise: Bike L2x80min S/L clamshells RTB x 10 B  NEUROMUSCULAR RE-EDUCATION:  Standing rows RTB 2x10 Standing shoulder ext RTB 2x10 Standing pallof press RTB 2x10 B Attempted quadruped arm raise but too much pressure on knees Bridges with RTB 10x5 Supine march + TrA RTB 10x3 B   05/21/23 Nustep L5x3min UE/LE Knee flexion 15lb 2x10 BLE Knee extension 10lb 2x10 BLE Standing rows RTB 2 x 10 Standing pallof press RTB x 15 bil  Functional Gait  Assessment  Gait Level Surface Walks 20 ft in less than 5.5 sec, no assistive devices, good speed, no evidence for imbalance, normal gait pattern, deviates no more than 6 in outside of the 12 in walkway width.   Change in Gait Speed Able to smoothly change walking speed without loss of balance or gait deviation. Deviate no more than 6 in outside of the 12 in walkway width.   Gait with Horizontal Head Turns Performs head turns smoothly with slight change in gait velocity (eg, minor disruption to smooth gait path), deviates 6-10 in outside 12 in walkway width, or uses an assistive device.   Gait with Vertical Head Turns Performs head turns with no change in gait. Deviates no more than 6 in outside 12 in walkway width.   Gait and Pivot Turn Pivot turns safely within 3 sec and stops quickly with no loss of balance.   Step Over Obstacle Is able to step over 2 stacked shoe boxes taped together (9 in total height) without changing gait speed. No evidence of  imbalance.   Gait with Narrow Base of Support Is able to ambulate for 10 steps heel to toe with no staggering.   Gait with Eyes Closed Walks 20 ft, uses assistive device, slower speed, mild gait deviations, deviates 6-10 in outside 12 in walkway width. Ambulates 20 ft in less than 9 sec but greater than 7 sec.   Ambulating Backwards Walks 20 ft, uses assistive device, slower speed, mild gait deviations, deviates 6-10 in outside 12 in walkway width.   Steps Alternating feet, no rail.   Total Score 27       05/14/23 - Re-Eval for Lumbar Spine / Recert of B knee pain Modified Oswestry  24 / 50 = 48.0 %  LEFS  43 / 80 = 53.8 %  SELF CARE:  Reviewed back eval findings and role of PT in addressing identified deficits as well as instruction in proper sitting posture to reduce low back strain and LE radiculopathy.    THERAPEUTIC EXERCISE: To improve strength, ROM, and flexibility.  Demonstration, verbal and tactile cues throughout for technique. Brief review of prior HEP POE x 30' Prone press up - tight across lower belly; feels good in back but feels pressure in shoulders x 30' Side-lying thoracic rotation w/ open book x 5 - noted cramps in shoulder/scapular Hooklying lumbar rotation x 5 Hooklying sequential march x 3   PATIENT EDUCATION:  Education details: HEP update - rows and pallof press  Person educated: Patient Education method: Explanation, Demonstration, Verbal cues, Handouts, and MedBridgeGO app updated  Education comprehension: verbalized understanding, returned demonstration, verbal cues required, and needs further education  HOME EXERCISE PROGRAM: *Pt using MedBridgeGO app  Access Code: [email or web address] URL: [email or web address] Date: 06/18/2023 Prepared by: Elijah Hidden  Exercises - Supine Hamstring Stretch with Strap  - 2 x daily - 7 x weekly - 3 reps - 30 sec hold - Supine Iliotibial Band Stretch with Strap  - 2 x daily - 7 x  weekly - 3 reps - 30 sec hold -  Prone Hip Flexor & Quad Stretch with Strap  - 2 x daily - 7 x weekly - 3 reps - 30 sec hold - Active Straight Leg Raise with Quad Set  - 2 x daily - 7 x weekly - 2 sets - 10 reps - 3-5 sec hold - Straight Leg Raise with External Rotation  - 2 x daily - 7 x weekly - 1-2 sets - 10 reps - 3 sec hold - Supine Lower Trunk Rotation  - 1-2 x daily - 7 x weekly - 5 reps - 10 sec hold - Sidelying Thoracic Rotation with Open Book  - 1-2 x daily - 7 x weekly - 10 reps - 5 sec hold - Seated Flexion Stretch with Swiss Ball  - 2 x daily - 7 x weekly - 3 reps - 30 sec hold - Seated Thoracic Flexion and Rotation with Swiss Ball  - 2 x daily - 7 x weekly - 3 reps - 30 sec hold - Supine Hip Adduction Isometric with Ball  - 2 x daily - 7 x weekly - 2 sets - 10 reps - 5 sec hold - Clamshell with Resistance  - 1 x daily - 7 x weekly - 2 sets - 10 reps - Hooklying Sequential Leg March and Lower  - 1 x daily - 7 x weekly - 2 sets - 10 reps - 3 sec hold - Standing Shoulder Row with Anchored Resistance  - 1 x daily - 3 x weekly - 2 sets - 10 reps - Standing Anti-Rotation Press with Anchored Resistance  - 1 x daily - 3 x weekly - 2 sets - 10 reps  Patient Education - Office Posture   ASSESSMENT:  CLINICAL IMPRESSION: Pt reports increased spasm today along L posterior knee from her tick bite. Worked on balance, stretching, and core exercises. Cues for form and to target the correct muscles. Improved muscle spasm post session. Alishba will benefit from continued skilled PT to address back issues and ongoing knee deficits to improve mobility and activity tolerance with decreased pain interference.   OBJECTIVE IMPAIRMENTS: Abnormal gait, decreased activity tolerance, decreased balance, decreased endurance, decreased knowledge of condition, decreased knowledge of use of DME, decreased mobility, difficulty walking, decreased strength, hypomobility, increased fascial restrictions, impaired perceived functional ability, increased  muscle spasms, impaired flexibility, impaired sensation, improper body mechanics, postural dysfunction, and pain.   ACTIVITY LIMITATIONS: carrying, lifting, bending, sitting, standing, squatting, sleeping, stairs, transfers, bed mobility, toileting, locomotion level, and caring for others  PARTICIPATION LIMITATIONS: meal prep, cleaning, laundry, driving, shopping, community activity, and walking her dog  PERSONAL FACTORS: Age, Fitness, Past/current experiences, Time since onset of injury/illness/exacerbation, and 3+ comorbidities: Osteoarthritis - multiple joints, anxiety, depression, GERD, headache, thyroid  disease - hypothyroidism, urinary incontinence, SVT, lumbar DDD, chronic LBP, sciatica are also affecting patient's functional outcome.   REHAB POTENTIAL: Good  CLINICAL DECISION MAKING: Unstable/unpredictable  EVALUATION COMPLEXITY: High   GOALS: Goals reviewed with patient? Yes  SHORT TERM GOALS: Target date:  04/16/2023  Patient will be independent with initial HEP. Baseline:  Goal status: MET - 04/23/23  2.  Patient will report at least 25% improvement in B knee pain to improve QOL. Baseline: 5/10 on eval, at worst 10/10  Goal status: MET - 04/23/23  LONG TERM GOALS: Target date: 05/14/2023; Extended to 07/09/2023  Patient will be independent with advanced/ongoing HEP to improve outcomes and carryover.  Baseline:  Goal status: IN PROGRESS -  06/18/23 - reviewed and updated today  2.  Patient will report at least 50-75% improvement in B knee pain to improve QOL. Baseline: 5/10 currently, at worst 10/10  Goal status: IN PROGRESS - 05/14/23 - 80% improvement in B knee pain  3.  Patient will demonstrate improved B LE strength to >/= 4+/5 for improved stability and ease of mobility. Baseline: Refer to above LE MMT table Goal status: PARTIALLY MET - 05/14/23  4.  Patient will be able to ambulate 600' with or w/o LRAD and normal gait pattern without increased pain to access community.   Baseline: Antalgic gait with flexed trunk using RW Goal status: IN PROGRESS - 05/14/23 - still using RW due to feeling of instability/uneven balance since ESI  5. Patient will be able to ascend/descend stairs with 1 HR and reciprocal step pattern safely to access home and community.  Baseline: NT, but pt reports severe pain with stair negotiation at home Goal status: IN PROGRESS - 05/14/23 - pt reports stairs are still painful but she is able to approach stairs reciprocally  6.  Patient will report ability to squat sufficient to complete normal daily tasks w/o limitation due to pain or weakness. Baseline:  Goal status: IN PROGRESS - 05/14/23 - able to complete full squat w/o increased pain but cues necessary for proper alignment and posterior weight shift at hips  7.  Patient will report >/= 25/80 on LEFS (MCID = 9 pts) to demonstrate improved functional ability. Baseline: 12 / 80 = 15.0 % Goal status: MET - 05/14/23 - 43 / 80 = 53.8 %  8.  Patient will demonstrate at least 23/30 on FGA to decrease risk of falls. Baseline: 19/30 (03/24/23) Goal status: MET - 05/21/23   9.  Patient will report at least 50-75% improvement in back pain to improve QOL. Baseline: 7/10 (05/14/23) Goal status: IN PROGRESS- 05/28/23 pt reports 70% improvement  10.  Patient will report </= 36% on modified Oswestry to demonstrate improved functional ability. Baseline: 24 / 50 = 48.0 % (05/14/23) Goal status: INITIAL   PLAN:  PT FREQUENCY: 1-2x/week  PT DURATION: 8 weeks  PLANNED INTERVENTIONS: 02835- PT Re-evaluation, 97110-Therapeutic exercises, 97530- Therapeutic activity, 97112- Neuromuscular re-education, 97535- Self Care, 02859- Manual therapy, [phone]- Gait training, [phone]- Aquatic Therapy, [phone]- Electrical stimulation (unattended), [phone]- Electrical stimulation (manual), 97016- Vasopneumatic device, [id number]- Ultrasound, [id number]- Ionotophoresis 4mg /ml Dexamethasone, Patient/Family education, Balance training, Stair  training, Taping, Dry Needling, Joint mobilization, DME instructions, Cryotherapy, Moist heat, and 97750- Physical Performance Test  ([place] not covered by [employer])  PLAN FOR NEXT SESSION: continue to update LTG assessment; work on lumbar flexibility and stretching; progress core and proximal LE strengthening - review/update HEP accordingly; MT +/- DN for for thoracolumbar paraspinals and upper glutes; possible trial of kinesiotaping for chondromalacia patella  Sol LITTIE Gaskins, PTA 06/25/2023, 11:49 AM   PHYSICAL THERAPY DISCHARGE SUMMARY  Visits from Start of Care: 10  Current functional level related to goals / functional outcomes: Refer to above clinical impression and goal assessment for status as of last visit on 06/25/2023. Patient canceled her remaining scheduled appointments until she saw her MD but did not return to PT, therefore will proceed with discharge from PT for this episode.     Remaining deficits: As above. Unable to formally assess status at discharge due to failure to return to PT.    Education / Equipment: Dietitian  Patient agrees to discharge. Patient goals were partially met. Patient is being discharged  due to not returning since the last visit.  Elijah EMERSON Hidden, PT 01/07/2024, 10:05 AM  [facility] [address]  [address] [place], [place], [address] Phone: [phone]   Fax:  [phone]

## 2023-06-25 NOTE — Progress Notes (Signed)
 Chief Complaint:    GERD  GI History: 63 y.o. female with a history of HCV (treated with Mavyret in 2018 with SVR), tobacco use, hypothyroidism, anxiety, depression, emphysema, hysterectomy, appendectomy, follows in the GI clinic for the following:   1) GERD: Longstanding history of reflux for 8+ years. Index sxs of HB, regurgitation. No dysphagia. Worse with tomato/tomato-based sauces, onions, peppers.  Has trialed Pepcid, Tums.  Reflux now well-controlled since starting Protonix  40 mg daily.   2) History of colon polyps.     3) History of HCV treated with Mavyret in 2018 with established SVR per patient. Liver enzymes normal in 2022 and 2023 (T. bili 1.4) - 10/27/2016: FibroSure: Fibrosis score 0.66, stage F3 (bridging fibrosis with many septa), activity score 0.27 (a 0-81).  BUN/creatinine 6/0.84, sodium 143, potassium 3.7, AST/ALT 66/33, T. bili 1.0, ALP 89.  HCV 142,000, genotype Ia - 11/21/2016: CT abdomen: Liver is nodular in contour suggesting cirrhosis.  Slight heterogenous enhancement of the hepatic parenchyma without gross focal lesion.  Unremarkable GB, pancreas.  Splenomegaly.  Diverticulosis without diverticulitis.  No ascites. - 04/02/2017: CT abdomen/pelvis: Mildly enlarged liver measuring 18 cm.  Nodular contour is again seen, compatible with cirrhosis.  Normal attenuation.  No liver lesion identified.  Mild contraction of the GB with mild wall thickening of 4 mm.  No pericholecystic fluid, gallstones, sludge.  No duct dilation.  Enlarged spleen measuring 17.3 cm.  A few tiny splenic cysts.  Normal pancreas without PD dilation.  Focal enteritis of the loop of jejunum within the central abdomen and the terminal ileum.  Mild portal hypertension with splenomegaly, enlarged main portal vein, enlarged splenic vein. - 08/21/2017: FibroSure: Score 0.55, stage F2 (bridging fibrosis with few septa), activity 0.  Protein 7.3, albumin 4.1, T. bili 0.9, AST/ALT 27/15, ALP 70.  HCV negative -  04/23/2021: Abdominal ultrasound: Heterogenous and coarsened liver echotexture and mild surface irregularity consistent with changes of cirrhosis. Patent PV. Splenomegaly measuring up to 15 cm  - 11/05/2022: CT A/P: Normal-appearing pancreas without PD dilation. Hepatic cirrhosis without mass or duct dilation. No ascites. Normal GB. Mild to moderate splenomegaly consistent with portal venous hypertension. Diverticulosis - 02/2023: Negative/normal serologic testing for concomitant liver disease.  INR 1.2.  Started hepatitis B vaccine series.  Fib-4 score 4.2 -02/21/2023: Ultrasound elastography: Slightly echogenic and nodular liver, with otherwise patent portal vein.  Median kPa 6.2 (negative for compensated advanced chronic liver disease).     Endoscopic History: - 7/ 6/ 2015: Colonoscopy: Moderate diverticulosis throughout, 7 mm villous adenoma removed from proximal sigmoid colon, external hemorrhoids - 08/09/2013: Colonoscopy: Moderate diverticulosis throughout, 7 mm villous adenoma removed from proximal sigmoid colon, external hemorrhoids  - 10/ 29/ 2018: Colonoscopy: moderate diverticulosis throughout the colon. 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 Small nonbleeding external hemorrhoids. Repeat in 5 years - 06/01/2021: EGD: Normal esophagus, moderate gastritis with antral erosions, normal duodenum.  Treated with Protonix  40 mg twice daily x 8 weeks, then reduced to 40 mg daily along with Carafate  x 4 weeks - 06/01/2021: Colonoscopy: Tortuous sigmoid, pandiverticulosis, medium size internal hemorrhoids, normal TI.  Repeat 10 years - 04/02/2023: EGD: Normal esophagus, mild striped antral erythema (path: Reactive inflammation without H. pylori), Otherwise normal  HPI:     Patient is a 63 y.o. female presenting to the Gastroenterology Clinic for follow-up.  Was last seen in the office on 02/14/2023.  Reflux overall much improved since starting Protonix  40 mg daily.  Rare breakthrough which tends to be with certain  foods  like tomato based sauces.  Recently started on rosuvastatin .    Review of systems:     No chest pain, no SOB, no fevers, no urinary sx   Past Medical History:  Diagnosis Date   Allergy    Anxiety    Arthritis    Chicken pox    Depression    Diverticulitis    Emphysema of lung (HCC)    GERD (gastroesophageal reflux disease)    Headache    History of colon polyps    Jaundice    Lichen planopilaris    Thyroid  disease    Urinary incontinence     Patient's surgical history, family medical history, social history, medications and allergies were all reviewed in Epic    Current Outpatient Medications  Medication Sig Dispense Refill   escitalopram  (LEXAPRO ) 10 MG tablet Take 1 tablet (10 mg total) by mouth daily. 90 tablet 3   ibuprofen  (ADVIL ) 800 MG tablet Take 1 tablet (800 mg total) by mouth every 8 (eight) hours as needed. 30 tablet 1   pantoprazole  (PROTONIX ) 40 MG tablet Take 1 tablet (40 mg total) by mouth as needed for indigestion. 90 tablet 3   rosuvastatin  (CRESTOR ) 10 MG tablet Take 1 tablet (10 mg total) by mouth daily. 90 tablet 3   SYNTHROID  75 MCG tablet Take 1 tablet (75 mcg total) by mouth daily before breakfast. 90 tablet 1   UNABLE TO FIND Med Name: Lipozem     No current facility-administered medications for this visit.    Physical Exam:     BP 120/86   Pulse 65   Ht 5\' 3"  (1.6 m)   Wt 193 lb 4 oz (87.7 kg)   BMI 34.23 kg/m   GENERAL:  Pleasant female in NAD PSYCH: : Cooperative, normal affect NEURO: Alert and oriented x 3   IMPRESSION and PLAN:    1) GERD - Well-controlled on current therapy - Continue Protonix  40 mg daily - Continue antireflux lifestyle/dietary modifications with avoidance of exacerbating type foods  2) Cirrhosis 3) History of HCV 4) Thrombocytopenia History of HCV treated with Mavyret in 2018 with established SVR per patient.  Reviewed labs and imaging from previous Gastroenterologist in [place] , and appears that  she probably has had mild, nonprogressive cirrhosis since at least 2018 based on imaging.  Several imaging studies showing nodular liver contour and elevated fibrosis scores, but otherwise largely preserved hepatic synthetic function.  PLT 90 and stable since 2018.  Recent INR 1.2, but otherwise normal bilirubin, albumin, sodium, renal function.  EGD without esophageal varices.  Several CT studies do show portal hypertensive changes with enlarged portal vein and mild splenomegaly, but no ascites or varices.  Discussed diagnosis of cirrhosis with patient at length today.  Plan for the following: - RUQ US  and AFP in July for hepatoma screening - UTD on variceal screening.  Repeat EGD in 2 years - Counseled on EtOH consumption.  Currently consumes 7 drinks/week - Labs UTD - Extended lab workup for concomitant liver disease otherwise negative/normal in 02/2023    RTC in 12 months or sooner prn           Nadalyn Deringer V Quanetta Truss ,DO, FACG 06/25/2023, 8:51 AM

## 2023-06-25 NOTE — Patient Instructions (Addendum)
 _______________________________________________________  If your blood pressure at your visit was 140/90 or greater, please contact your primary care physician to follow up on this.  If you are age 64 or younger, your body mass index should be between 19-25. Your Body mass index is 34.23 kg/m. If this is out of the aformentioned range listed, please consider follow up with your Primary Care Provider.  ________________________________________________________  The [facility] providers would like to encourage you to use MYCHART to communicate with providers for non-urgent requests or questions.  Due to long hold times on the telephone, sending your provider a message by [facility] may be a faster and more efficient way to get a response.  Please allow 48 business hours for a response.  Please remember that this is for non-urgent requests.  _______________________________________________________  Amanda Maynard will need an ultrasound and lab work in July 2025.  We will contact you as a reminder when it gets closer to July.  It was a pleasure to see you today!  Vito Cirigliano, D.O.

## 2023-07-02 ENCOUNTER — Ambulatory Visit

## 2023-07-09 ENCOUNTER — Ambulatory Visit: Admitting: Physical Therapy

## 2023-08-18 ENCOUNTER — Other Ambulatory Visit (HOSPITAL_BASED_OUTPATIENT_CLINIC_OR_DEPARTMENT_OTHER): Payer: Self-pay

## 2023-08-25 ENCOUNTER — Other Ambulatory Visit: Payer: Self-pay

## 2023-08-25 DIAGNOSIS — K7460 Unspecified cirrhosis of liver: Secondary | ICD-10-CM

## 2023-09-02 ENCOUNTER — Ambulatory Visit (HOSPITAL_BASED_OUTPATIENT_CLINIC_OR_DEPARTMENT_OTHER)
Admission: RE | Admit: 2023-09-02 | Discharge: 2023-09-02 | Disposition: A | Discharge status: Home / Self Care | Source: Ambulatory Visit | Attending: Gastroenterology | Admitting: Gastroenterology

## 2023-09-02 DIAGNOSIS — K7460 Unspecified cirrhosis of liver: Secondary | ICD-10-CM | POA: Diagnosis not present

## 2023-09-02 DIAGNOSIS — K7689 Other specified diseases of liver: Secondary | ICD-10-CM | POA: Diagnosis not present

## 2023-09-03 ENCOUNTER — Other Ambulatory Visit

## 2023-09-03 DIAGNOSIS — K7460 Unspecified cirrhosis of liver: Secondary | ICD-10-CM

## 2023-09-05 LAB — AFP TUMOR MARKER: AFP-Tumor Marker: 3 ng/mL

## 2023-09-08 ENCOUNTER — Ambulatory Visit: Payer: Self-pay | Admitting: Gastroenterology

## 2023-09-12 ENCOUNTER — Other Ambulatory Visit (HOSPITAL_BASED_OUTPATIENT_CLINIC_OR_DEPARTMENT_OTHER): Payer: Self-pay

## 2023-09-27 NOTE — Patient Instructions (Incomplete)
 It was good to see you today Recommend you get a flu shot and COVID booster this fall Have a great time on your upcoming trip!   Increase your lexapro  to 20 mg for a couple of weeks and let me know what you think!  If you like the 20 mg I can change your Rx - just let me know  I would also recommend that you get more support in taking care of your mom- perhaps you might find some help so you can have more breaks.  Also a counselor or support group for you would be a good idea  Set up regular scheduled times for your sister to be in charge of your mom- they can come to your place while you travel or your mom can go to them

## 2023-09-27 NOTE — Progress Notes (Unsigned)
 [facility] [address] [place], [place] [address] [id number] [phone]  Date:  10/01/2023   Name:  Amanda Maynard   DOB:  1960/11/12   MRN:  [id number]  PCP:  Watt Harlene BROCKS, MD    Chief Complaint: No chief complaint on file.   History of Present Illness:  Amanda Maynard is a 63 y.o. very pleasant female patient who presents with the following:  Patient seen today for periodic follow-up.  I saw her most recently in March for her physical-at that time she was working towards getting Social Security disability  history of hypothyroidism, hepatitis C status post curative treatment with liver fibrosis  Recommend flu shot this fall, COVID booster Shingrix  is complete Can give her a pneumonia vaccine if she would like DEXA scan, mammogram, colonoscopy up-to-date Status post hysterectomy  We did blood work in March; BMP, lipid, ferritin, A1c, TSH Patient Active Problem List   Diagnosis Date Noted   Osteopenia 05/01/2023   Supraventricular tachycardia (HCC) 12/13/2022   Palpitations 12/13/2022   Chondromalacia of both patellae 12/05/2022   Primary osteoarthritis of both hands 12/05/2022   Degeneration of intervertebral disc of lumbar region without discogenic back pain or lower extremity pain 12/05/2022   Arthritis of finger of left hand 05/10/2022   Bilateral hand pain 05/06/2022   Migraine 04/10/2022   Anxiety and depression 02/04/2022   Chronic lower back pain 12/05/2021   Chest pain 10/10/2021   Hepatitis C antibody test positive 12/13/2020   Hypothyroidism (acquired) 12/13/2020   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   Arthritis 02/05/2020   Sciatica 02/05/2020    Past Medical History:  Diagnosis Date   Allergy    Anxiety    Arthritis    Chicken pox    Depression    Diverticulitis    Emphysema of lung (HCC)    GERD (gastroesophageal reflux disease)    Headache    History of colon polyps    Jaundice     Lichen planopilaris    Thyroid  disease    Urinary incontinence     Past Surgical History:  Procedure Laterality Date   ABDOMINAL HYSTERECTOMY  2012   APPENDECTOMY  2009   COLONOSCOPY  12/02/2016   Dr. Camellia Sinks. Moderate diverticulosis of the ascending colon, transverse colon, descending colon and sigmoid colon, increase in vascularity of the rectum. External hemorrhoids.   COLONOSCOPY  08/09/2013   Dr. Camellia Sinks. Moderate diverticulosis of the hepatic flexure, transverse colon, splenic flexure, descending colon and sigmoid colon. Polyp (7mm) in the proximal sigmoid colon. (polypectomy). External hemorrhoids.   ESOPHAGOGASTRODUODENOSCOPY  08/09/2013   Dr. Camellia Sinks. Normal stomach. Normal duodenum. Grade 1 esophagitis was seen in the lower third of the esophagus.    Social History   Tobacco Use   Smoking status: Every Day    Current packs/day: 0.50    Average packs/day: 0.5 packs/day for 30.0 years (15.0 ttl pk-yrs)    Types: Cigarettes    Passive exposure: Past   Tobacco comments:    Have quit twice in 30 years but start again when stress level is too high  Vaping Use   Vaping status: Never Used  Substance Use Topics   Alcohol use: Yes    Alcohol/week: 6.0 standard drinks of alcohol    Types: 3 Cans of beer, 3 Standard drinks or equivalent per week    Comment: occ   Drug use: Never    Family History  Problem Relation Age of Onset   Breast cancer Mother 62   Heart disease Mother    Hearing loss Mother    Depression Mother    Cancer Mother    Arthritis Mother    Cancer Father    Hyperlipidemia Father    Depression Sister    Depression Sister    Heart disease Sister    Heart disease Maternal Grandmother    Cancer Maternal Grandfather    Arthritis Paternal Grandmother    Rheum arthritis Paternal Grandmother    Cancer Paternal Grandfather    Heart attack Paternal Grandfather    Esophageal cancer Neg Hx    Colon cancer Neg Hx    Stomach cancer Neg Hx    Rectal  cancer Neg Hx     Allergies  Allergen Reactions   Molds & Smuts Shortness Of Breath   Sertraline Other (See Comments)    Crazy dreams    Sulfa Antibiotics Itching, Nausea Only, Rash and Nausea And Vomiting    Medication list has been reviewed and updated.  Current Outpatient Medications on File Prior to Visit  Medication Sig Dispense Refill   escitalopram  (LEXAPRO ) 10 MG tablet Take 1 tablet (10 mg total) by mouth daily. 90 tablet 3   ibuprofen  (ADVIL ) 800 MG tablet Take 1 tablet (800 mg total) by mouth every 8 (eight) hours as needed. 30 tablet 1   pantoprazole  (PROTONIX ) 40 MG tablet Take 1 tablet (40 mg total) by mouth as needed for indigestion. 90 tablet 3   rosuvastatin  (CRESTOR ) 10 MG tablet Take 1 tablet (10 mg total) by mouth daily. 90 tablet 3   SYNTHROID  75 MCG tablet Take 1 tablet (75 mcg total) by mouth daily before breakfast. 90 tablet 1   UNABLE TO FIND Med Name: Lipozem     No current facility-administered medications on file prior to visit.    Review of Systems:  ***  Physical Examination: There were no vitals filed for this visit. There were no vitals filed for this visit. There is no height or weight on file to calculate BMI. Ideal Body Weight:    ***  Assessment and Plan: ***  Signed Harlene Schroeder, MD

## 2023-10-01 ENCOUNTER — Encounter: Payer: Self-pay | Admitting: Family Medicine

## 2023-10-01 ENCOUNTER — Ambulatory Visit: Admitting: Family Medicine

## 2023-10-01 VITALS — BP 126/74 | HR 75 | Temp 97.7°F | Ht 63.0 in | Wt 192.0 lb

## 2023-10-01 DIAGNOSIS — F32A Depression, unspecified: Secondary | ICD-10-CM

## 2023-10-01 DIAGNOSIS — E785 Hyperlipidemia, unspecified: Secondary | ICD-10-CM | POA: Diagnosis not present

## 2023-10-01 DIAGNOSIS — Z131 Encounter for screening for diabetes mellitus: Secondary | ICD-10-CM | POA: Diagnosis not present

## 2023-10-01 DIAGNOSIS — F419 Anxiety disorder, unspecified: Secondary | ICD-10-CM

## 2023-10-01 DIAGNOSIS — Z636 Dependent relative needing care at home: Secondary | ICD-10-CM

## 2023-10-01 DIAGNOSIS — R5383 Other fatigue: Secondary | ICD-10-CM

## 2023-10-01 DIAGNOSIS — E039 Hypothyroidism, unspecified: Secondary | ICD-10-CM | POA: Diagnosis not present

## 2023-10-01 DIAGNOSIS — R768 Other specified abnormal immunological findings in serum: Secondary | ICD-10-CM

## 2023-10-01 DIAGNOSIS — E611 Iron deficiency: Secondary | ICD-10-CM

## 2023-10-01 LAB — CBC
HCT: 34.3 % — ABNORMAL LOW (ref 36.0–46.0)
Hemoglobin: 11 g/dL — ABNORMAL LOW (ref 12.0–15.0)
MCHC: 32 g/dL (ref 30.0–36.0)
MCV: 82.4 fl (ref 78.0–100.0)
Platelets: 84 K/uL — ABNORMAL LOW (ref 150.0–400.0)
RBC: 4.16 Mil/uL (ref 3.87–5.11)
RDW: 14.9 % (ref 11.5–15.5)
WBC: 2.7 K/uL — ABNORMAL LOW (ref 4.0–10.5)

## 2023-10-01 LAB — TSH: TSH: 1.74 u[IU]/mL (ref 0.35–5.50)

## 2023-10-01 LAB — VITAMIN B12: Vitamin B-12: 290 pg/mL (ref 211–911)

## 2023-10-01 LAB — COMPREHENSIVE METABOLIC PANEL WITH GFR
ALT: 11 U/L (ref 0–35)
AST: 19 U/L (ref 0–37)
Albumin: 4.4 g/dL (ref 3.5–5.2)
Alkaline Phosphatase: 57 U/L (ref 39–117)
BUN: 9 mg/dL (ref 6–23)
CO2: 29 meq/L (ref 19–32)
Calcium: 9.6 mg/dL (ref 8.4–10.5)
Chloride: 105 meq/L (ref 96–112)
Creatinine, Ser: 0.78 mg/dL (ref 0.40–1.20)
GFR: 80.68 mL/min (ref >60.00)
Glucose, Bld: 98 mg/dL (ref 70–99)
Potassium: 4.3 meq/L (ref 3.5–5.1)
Sodium: 141 meq/L (ref 135–145)
Total Bilirubin: 1.1 mg/dL (ref 0.2–1.2)
Total Protein: 7.2 g/dL (ref 6.0–8.3)

## 2023-10-01 LAB — VITAMIN D 25 HYDROXY (VIT D DEFICIENCY, FRACTURES): VITD: 35.36 ng/mL (ref 30.00–100.00)

## 2023-10-01 LAB — FERRITIN: Ferritin: 7.3 ng/mL — ABNORMAL LOW (ref 10.0–291.0)

## 2023-10-02 ENCOUNTER — Ambulatory Visit: Payer: Self-pay | Admitting: Family Medicine

## 2023-10-02 LAB — LIPID PANEL
Cholesterol: 174 mg/dL (ref <200)
HDL: 63 mg/dL (ref >=50)
LDL Cholesterol (Calc): 89 mg/dL
Non-HDL Cholesterol (Calc): 111 mg/dL (ref <130)
Total CHOL/HDL Ratio: 2.8 (calc) (ref <5.0)
Triglycerides: 128 mg/dL (ref <150)

## 2023-10-15 ENCOUNTER — Other Ambulatory Visit (HOSPITAL_BASED_OUTPATIENT_CLINIC_OR_DEPARTMENT_OTHER): Payer: Self-pay

## 2023-10-22 ENCOUNTER — Inpatient Hospital Stay: Admitting: Family

## 2023-10-22 ENCOUNTER — Inpatient Hospital Stay: Attending: Hematology & Oncology

## 2023-10-22 VITALS — BP 107/68 | HR 64 | Temp 98.1°F | Resp 18 | Ht 62.25 in | Wt 193.0 lb

## 2023-10-22 DIAGNOSIS — F17210 Nicotine dependence, cigarettes, uncomplicated: Secondary | ICD-10-CM

## 2023-10-22 DIAGNOSIS — Z803 Family history of malignant neoplasm of breast: Secondary | ICD-10-CM | POA: Insufficient documentation

## 2023-10-22 DIAGNOSIS — D509 Iron deficiency anemia, unspecified: Secondary | ICD-10-CM | POA: Diagnosis not present

## 2023-10-22 NOTE — Progress Notes (Signed)
 Hematology/Oncology Consultation   Name: Amanda Maynard      MRN: [id number]    Location: Room/bed info not found  Date: 10/22/2023 Time:3:21 PM   REFERRING PHYSICIAN:  Harlene Schroeder, MD  REASON FOR CONSULT:  Iron  deficiency    DIAGNOSIS: Iron  deficiency anemia  HISTORY OF PRESENT ILLNESS:  Amanda Maynard is a pleasant 63 yo caucasian female with iron  deficiency anemia.  Her ferritin is only 7, 11.0 and MCV 82.  She states that she has received IV iron  in the past and tolerated without any problems. Her last dose was in 2019 while she was living in [place] .  We have seen her in the past for thrombocytopenia secondary to history of Hep C and cirrhosis. Patient treated in past for Hep C and cured.  She has not noted any blood loss. No abnormal bruising, no petechiae.  She is symptomatic with fatigue, craving lots of ice and lightheadedness.  She had her Colonoscopy in 05/2021 which showed a tortuous colon, diverticulosis in the sigmoid, descending, transverse and ascending colon as well as non bleeding internal hemorrhoids.  EGD in 03/2023 showed erythematous mucosa of the antrum and cardia, biopsy negative. She is on Protonix  PRN.  Mammogram in February 2025 was negative.  Bone density scan in March 2025 showed osteopenia  She has had a total hysterectomy and no children or history or miscarriage.  No personal history of cancer. Her mother has had breat cancer trice and treated successfully both times.  No history of diabetes.  She has hypothyroidism and is on Synthroid  daily.  She smokes 1 ppd. She enjoys ETOH socially specifically martinis with vodka.  No recreational drug use.  No fever, chills, n/v, cough, rash, chest pain, SOB, abdominal pain or changes in bowel or bladder habits.  She has occasional palpitations with history of SVT.  No swelling in her extremities.  She has chronic joint aches and back pain with arthritis.  No falls or syncope reported.  She notes positional  numbness and tingling in her arms at night.  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  She enjoys going on gambling adventures with her mom, sisters and niece.   ROS: All other 10 point review of systems is negative.   PAST MEDICAL HISTORY:   Past Medical History:  Diagnosis Date   Allergy    Anxiety    Arthritis    Chicken pox    Depression    Diverticulitis    Emphysema of lung (HCC)    GERD (gastroesophageal reflux disease)    Headache    History of colon polyps    Jaundice    Lichen planopilaris    Thyroid  disease    Urinary incontinence     ALLERGIES: Allergies  Allergen Reactions   Molds & Smuts Shortness Of Breath   Sertraline Other (See Comments)    Crazy dreams    Sulfa Antibiotics Itching, Nausea Only, Rash and Nausea And Vomiting      MEDICATIONS:  Current Outpatient Medications on File Prior to Visit  Medication Sig Dispense Refill   escitalopram  (LEXAPRO ) 10 MG tablet Take 1 tablet (10 mg total) by mouth daily. 90 tablet 3   ibuprofen  (ADVIL ) 800 MG tablet Take 1 tablet (800 mg total) by mouth every 8 (eight) hours as needed. 30 tablet 1   pantoprazole  (PROTONIX ) 40 MG tablet Take 1 tablet (40 mg total) by mouth as needed for indigestion. 90 tablet 3  rosuvastatin  (CRESTOR ) 10 MG tablet Take 1 tablet (10 mg total) by mouth daily. 90 tablet 3   SYNTHROID  75 MCG tablet Take 1 tablet (75 mcg total) by mouth daily before breakfast. 90 tablet 1   UNABLE TO FIND Med Name: Lipozem     No current facility-administered medications on file prior to visit.     PAST SURGICAL HISTORY Past Surgical History:  Procedure Laterality Date   ABDOMINAL HYSTERECTOMY  2012   APPENDECTOMY  2009   COLONOSCOPY  12/02/2016   Dr. Camellia Sinks. Moderate diverticulosis of the ascending colon, transverse colon, descending colon and sigmoid colon, increase in vascularity of the rectum. External hemorrhoids.   COLONOSCOPY  08/09/2013   Dr.  Camellia Sinks. Moderate diverticulosis of the hepatic flexure, transverse colon, splenic flexure, descending colon and sigmoid colon. Polyp (7mm) in the proximal sigmoid colon. (polypectomy). External hemorrhoids.   ESOPHAGOGASTRODUODENOSCOPY  08/09/2013   Dr. Camellia Sinks. Normal stomach. Normal duodenum. Grade 1 esophagitis was seen in the lower third of the esophagus.    FAMILY HISTORY: Family History  Problem Relation Age of Onset   Breast cancer Mother 90   Heart disease Mother    Hearing loss Mother    Depression Mother    Cancer Mother    Arthritis Mother    Cancer Father    Hyperlipidemia Father    Depression Sister    Depression Sister    Heart disease Sister    Heart disease Maternal Grandmother    Cancer Maternal Grandfather    Arthritis Paternal Grandmother    Rheum arthritis Paternal Grandmother    Cancer Paternal Grandfather    Heart attack Paternal Grandfather    Esophageal cancer Neg Hx    Colon cancer Neg Hx    Stomach cancer Neg Hx    Rectal cancer Neg Hx     SOCIAL HISTORY:  reports that she has been smoking cigarettes. She has a 15 pack-year smoking history. She has been exposed to tobacco smoke. She does not have any smokeless tobacco history on file. She reports current alcohol use of about 6.0 standard drinks of alcohol per week. She reports that she does not use drugs.  PERFORMANCE STATUS: The patient's performance status is 1 - Symptomatic but completely ambulatory  PHYSICAL EXAM: Most Recent Vital Signs: There were no vitals taken for this visit. BP 107/68 (BP Location: Right Arm, Patient Position: Sitting, Cuff Size: Normal)   Pulse 64   Temp 98.1 F (36.7 C) (Oral)   Resp 18   Ht 5' 2.25 (1.581 m)   Wt 193 lb 0.6 oz (87.6 kg)   SpO2 100%   BMI 35.02 kg/m   General Appearance:    Alert, cooperative, no distress, appears stated age  Head:    Normocephalic, without obvious abnormality, atraumatic  Eyes:    PERRL, conjunctiva/corneas clear, EOM's  intact, fundi    benign, both eyes        Throat:   Lips, mucosa, and tongue normal; teeth and gums normal  Neck:   Supple, symmetrical, trachea midline, no adenopathy;    thyroid :  no enlargement/tenderness/nodules; no carotid   bruit or JVD  Back:     Symmetric, no curvature, ROM normal, no CVA tenderness  Lungs:     Clear to auscultation bilaterally, respirations unlabored  Chest Wall:    No tenderness or deformity   Heart:    Regular rate and rhythm, S1 and S2 normal, no murmur, rub   or gallop  Abdomen:     Soft, non-tender, bowel sounds active all four quadrants,    no masses, no organomegaly        Extremities:   Extremities normal, atraumatic, no cyanosis or edema  Pulses:   2+ and symmetric all extremities  Skin:   Skin color, texture, turgor normal, no rashes or lesions  Lymph nodes:   Cervical, supraclavicular, and axillary nodes normal  Neurologic:   CNII-XII intact, normal strength, sensation and reflexes    throughout    LABORATORY DATA:  No results found for this or any previous visit (from the past 48 hours).    RADIOGRAPHY: No results found.     PATHOLOGY: None  ASSESSMENT/PLAN: Ms. Giacobbe is a pleasant 63 yo caucasian female with iron  deficiency anemia.  We will get her set up for 3 doses of Venofer .  Follow-up in 8 weeks.   All questions were answered. The patient knows to call the clinic with any problems, questions or concerns. We can certainly see the patient much sooner if necessary.   Lauraine Pepper, NP

## 2023-10-23 ENCOUNTER — Encounter: Payer: Self-pay | Admitting: Family

## 2023-10-24 ENCOUNTER — Inpatient Hospital Stay

## 2023-10-24 VITALS — BP 130/91 | HR 66 | Temp 98.0°F | Resp 18

## 2023-10-24 DIAGNOSIS — D509 Iron deficiency anemia, unspecified: Secondary | ICD-10-CM

## 2023-10-24 DIAGNOSIS — Z803 Family history of malignant neoplasm of breast: Secondary | ICD-10-CM | POA: Diagnosis not present

## 2023-10-24 DIAGNOSIS — F17210 Nicotine dependence, cigarettes, uncomplicated: Secondary | ICD-10-CM | POA: Diagnosis not present

## 2023-10-24 MED ORDER — SODIUM CHLORIDE 0.9 % IV SOLN: INTRAVENOUS | Status: DC

## 2023-10-24 MED ORDER — IRON SUCROSE 300 MG IVPB - SIMPLE MED
300.0000 mg | Freq: Once | Status: AC
  Administered 2023-10-24: 300 mg via INTRAVENOUS
  Filled 2023-10-24: qty 300

## 2023-10-24 NOTE — Patient Instructions (Signed)

## 2023-10-31 ENCOUNTER — Inpatient Hospital Stay

## 2023-10-31 VITALS — BP 112/85 | HR 62 | Temp 98.2°F

## 2023-10-31 DIAGNOSIS — D509 Iron deficiency anemia, unspecified: Secondary | ICD-10-CM

## 2023-10-31 DIAGNOSIS — F17210 Nicotine dependence, cigarettes, uncomplicated: Secondary | ICD-10-CM | POA: Diagnosis not present

## 2023-10-31 DIAGNOSIS — Z803 Family history of malignant neoplasm of breast: Secondary | ICD-10-CM | POA: Diagnosis not present

## 2023-10-31 MED ORDER — SODIUM CHLORIDE 0.9 % IV SOLN: INTRAVENOUS | Status: DC

## 2023-10-31 MED ORDER — IRON SUCROSE 300 MG IVPB - SIMPLE MED
300.0000 mg | Freq: Once | Status: AC
  Administered 2023-10-31: 300 mg via INTRAVENOUS
  Filled 2023-10-31: qty 300

## 2023-10-31 NOTE — Patient Instructions (Signed)
 [facility] - A DEPT OF [facility]  Discharge Instructions: Thank you for choosing [facility] to provide your oncology and hematology care.   If you have a lab appointment with the Cancer Center, please go directly to the Cancer Center and check in at the registration area.  Wear comfortable clothing and clothing appropriate for easy access to any Portacath or PICC line.   We strive to give you quality time with your provider. You may need to reschedule your appointment if you arrive late (15 or more minutes).  Arriving late affects you and other patients whose appointments are after yours.  Also, if you miss three or more appointments without notifying the office, you may be dismissed from the clinic at the provider's discretion.      For prescription refill requests, have your pharmacy contact our office and allow 72 hours for refills to be completed.    Today you received the following  agents Venofer      To help prevent nausea and vomiting after your treatment, we encourage you to take your nausea medication as directed.  BELOW ARE SYMPTOMS THAT SHOULD BE REPORTED IMMEDIATELY: *FEVER GREATER THAN 100.4 F (38 C) OR HIGHER *CHILLS OR SWEATING *NAUSEA AND VOMITING THAT IS NOT CONTROLLED WITH YOUR NAUSEA MEDICATION *UNUSUAL SHORTNESS OF BREATH *UNUSUAL BRUISING OR BLEEDING *URINARY PROBLEMS (pain or burning when urinating, or frequent urination) *BOWEL PROBLEMS (unusual diarrhea, constipation, pain near the anus) TENDERNESS IN MOUTH AND THROAT WITH OR WITHOUT PRESENCE OF ULCERS (sore throat, sores in mouth, or a toothache) UNUSUAL RASH, SWELLING OR PAIN  UNUSUAL VAGINAL DISCHARGE OR ITCHING   Items with * indicate a potential emergency and should be followed up as soon as possible or go to the Emergency Department if any problems should occur.  Please show the CHEMOTHERAPY ALERT CARD or IMMUNOTHERAPY ALERT CARD at check-in to the  Emergency Department and triage nurse. Should you have questions after your visit or need to cancel or reschedule your appointment, please contact [facility] - A DEPT OF [facility]  [phone] and follow the prompts.  Office hours are 8:00 a.m. to 4:30 p.m. Monday - Friday. Please note that voicemails left after 4:00 p.m. may not be returned until the following business day.  We are closed weekends and major holidays. You have access to a nurse at all times for urgent questions. Please call the main number to the clinic [phone] and follow the prompts.  For any non-urgent questions, you may also contact your provider using MyChart. We now offer e-Visits for anyone 79 and older to request care online for non-urgent symptoms. For details visit [email or web address].   Also download the MyChart app! Go to the app store, search "MyChart", open the app, select , and log in with your MyChart username and password.

## 2023-11-07 ENCOUNTER — Inpatient Hospital Stay

## 2023-11-07 ENCOUNTER — Inpatient Hospital Stay: Attending: Hematology & Oncology

## 2023-11-07 VITALS — BP 109/61 | HR 66 | Temp 98.0°F | Resp 18

## 2023-11-07 DIAGNOSIS — D509 Iron deficiency anemia, unspecified: Secondary | ICD-10-CM | POA: Diagnosis not present

## 2023-11-07 MED ORDER — SODIUM CHLORIDE 0.9 % IV SOLN: INTRAVENOUS | Status: DC

## 2023-11-07 MED ORDER — IRON SUCROSE 300 MG IVPB - SIMPLE MED
300.0000 mg | Freq: Once | Status: AC
  Administered 2023-11-07: 300 mg via INTRAVENOUS
  Filled 2023-11-07: qty 300

## 2023-11-07 NOTE — Patient Instructions (Signed)

## 2023-11-20 ENCOUNTER — Other Ambulatory Visit: Payer: Self-pay | Admitting: Family Medicine

## 2023-11-20 DIAGNOSIS — E039 Hypothyroidism, unspecified: Secondary | ICD-10-CM

## 2023-11-21 ENCOUNTER — Encounter: Payer: Self-pay | Admitting: Family

## 2023-11-21 ENCOUNTER — Other Ambulatory Visit (HOSPITAL_BASED_OUTPATIENT_CLINIC_OR_DEPARTMENT_OTHER): Payer: Self-pay

## 2023-11-21 MED ORDER — SYNTHROID 75 MCG PO TABS
75.0000 ug | ORAL_TABLET | Freq: Every day | ORAL | 1 refills | Status: AC
  Filled 2023-11-21: qty 90, 90d supply, fill #0
  Filled 2024-02-19: qty 90, 90d supply, fill #1

## 2023-11-21 NOTE — Progress Notes (Signed)
 Office Visit Note  Patient: Amanda Maynard             Date of Birth: September 30, 1960           MRN: [id number]             PCP: Watt Harlene BROCKS, MD Referring: Watt Harlene BROCKS, MD Visit Date: 12/05/2023 Occupation: Data Unavailable  Subjective:  Multiple joints  History of Present Illness: Amanda Maynard is a 63 y.o. female with osteoarthritis and degenerative disc disease.  She returns today after her last visit in October 2024.  She continues to have discomfort in her hands, hips, knees, neck and her lower back.  She states with the colder weather she also has been experiencing some cramps in her hands and her feet.  She is not experiencing any joint swelling.  She also has positive rheumatoid factor.  Patient states she was recently diagnosed with iron  deficiency anemia and was referred to hematology.  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    Activities of Daily Living:  Patient reports morning stiffness for several hours.   Patient Reports nocturnal pain.  Difficulty dressing/grooming: Denies Difficulty climbing stairs: Denies Difficulty getting out of chair: Denies Difficulty using hands for taps, buttons, cutlery, and/or writing: Reports  Review of Systems  Constitutional:  Positive for fatigue.  HENT:  Negative for mouth sores and mouth dryness.   Eyes:  Positive for dryness.  Respiratory:  Negative for shortness of breath.   Cardiovascular:  Negative for chest pain and palpitations.  Gastrointestinal:  Positive for diarrhea. Negative for blood in stool and constipation.  Endocrine: Negative for increased urination.  Genitourinary:  Negative for involuntary urination.  Musculoskeletal:  Positive for joint pain, joint pain, joint swelling, myalgias, morning stiffness, muscle tenderness and myalgias. Negative for gait problem and muscle weakness.  Skin:  Negative for color change, rash, hair  loss and sensitivity to sunlight.  Allergic/Immunologic: Negative for susceptible to infections.  Neurological:  Positive for dizziness and numbness. Negative for headaches.  Hematological:  Negative for swollen glands.  Psychiatric/Behavioral:  Positive for depressed mood and sleep disturbance. The patient is nervous/anxious.     PMFS History:  Patient Active Problem List   Diagnosis Date Noted   IDA (iron  deficiency anemia) 10/22/2023   Osteopenia 05/01/2023   Supraventricular tachycardia 12/13/2022   Palpitations 12/13/2022   Chondromalacia of both patellae 12/05/2022   Primary osteoarthritis of both hands 12/05/2022   Degeneration of intervertebral disc of lumbar region without discogenic back pain or lower extremity pain 12/05/2022   Arthritis of finger of left hand 05/10/2022   Bilateral hand pain 05/06/2022   Migraine 04/10/2022   Anxiety and depression 02/04/2022   Chronic lower back pain 12/05/2021   Chest pain 10/10/2021   Hepatitis C antibody test positive 12/13/2020   Hypothyroidism (acquired) 12/13/2020   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   Arthritis 02/05/2020   Sciatica 02/05/2020    Past Medical History:  Diagnosis Date   Allergy    Anxiety    Arthritis    Chicken pox    Depression    Diverticulitis    Emphysema of lung (HCC)    GERD (gastroesophageal reflux disease)    Headache    History of colon polyps    Jaundice    Lichen planopilaris    Thyroid  disease    Urinary incontinence     Family History  Problem Relation Age of Onset  Breast cancer Mother 71   Heart disease Mother    Hearing loss Mother    Depression Mother    Cancer Mother    Arthritis Mother    Cancer Father    Hyperlipidemia Father    Depression Sister    Depression Sister    Heart disease Sister    Heart disease Maternal Grandmother    Cancer Maternal Grandfather    Arthritis Paternal Grandmother    Rheum arthritis Paternal Grandmother    Cancer Paternal Grandfather     Heart attack Paternal Grandfather    Esophageal cancer Neg Hx    Colon cancer Neg Hx    Stomach cancer Neg Hx    Rectal cancer Neg Hx    Past Surgical History:  Procedure Laterality Date   ABDOMINAL HYSTERECTOMY  2012   APPENDECTOMY  2009   COLONOSCOPY  12/02/2016   Dr. Camellia Sinks. Moderate diverticulosis of the ascending colon, transverse colon, descending colon and sigmoid colon, increase in vascularity of the rectum. External hemorrhoids.   COLONOSCOPY  08/09/2013   Dr. Camellia Sinks. Moderate diverticulosis of the hepatic flexure, transverse colon, splenic flexure, descending colon and sigmoid colon. Polyp (7mm) in the proximal sigmoid colon. (polypectomy). External hemorrhoids.   ESOPHAGOGASTRODUODENOSCOPY  08/09/2013   Dr. Camellia Sinks. Normal stomach. Normal duodenum. Grade 1 esophagitis was seen in the lower third of the esophagus.   UPPER GI ENDOSCOPY  04/02/2023   Social History   Tobacco Use   Smoking status: Every Day    Current packs/day: 0.50    Average packs/day: 0.5 packs/day for 30.0 years (15.0 ttl pk-yrs)    Types: Cigarettes    Passive exposure: Past   Tobacco comments:    Have quit twice in 30 years but start again when stress level is too high  Vaping Use   Vaping status: Never Used  Substance Use Topics   Alcohol use: Yes    Alcohol/week: 6.0 standard drinks of alcohol    Types: 3 Cans of beer, 3 Standard drinks or equivalent per week    Comment: occ   Drug use: Never   Social History   Social History Narrative   Not on file     Immunization History  Administered Date(s) Administered   Hepb-cpg 02/21/2023, 03/25/2023   Influenza, Seasonal, Injecte, Preservative Fre 10/30/2022   PFIZER(Purple Top)SARS-COV-2 Vaccination 05/13/2019, 06/09/2019   Tdap 04/18/2021   Zoster Recombinant(Shingrix ) 12/13/2020, 04/18/2021     Objective: Vital Signs: BP 120/78   Pulse (!) 58   Temp 97.7 F (36.5 C)   Resp 15   Ht 5' 3 (1.6 m)   Wt 189 lb 12.8 oz (86.1  kg)   BMI 33.62 kg/m    Physical Exam Vitals and nursing note reviewed.  Constitutional:      Appearance: She is well-developed.  HENT:     Head: Normocephalic and atraumatic.  Eyes:     Conjunctiva/sclera: Conjunctivae normal.  Cardiovascular:     Rate and Rhythm: Normal rate and regular rhythm.     Heart sounds: Normal heart sounds.  Pulmonary:     Effort: Pulmonary effort is normal.     Breath sounds: Normal breath sounds.  Abdominal:     General: Bowel sounds are normal.     Palpations: Abdomen is soft.  Musculoskeletal:     Cervical back: Normal range of motion.  Lymphadenopathy:     Cervical: No cervical adenopathy.  Skin:    General: Skin is warm and dry.  Capillary Refill: Capillary refill takes less than 2 seconds.  Neurological:     Mental Status: She is alert and oriented to person, place, and time.  Psychiatric:        Behavior: Behavior normal.      Musculoskeletal Exam: Cervical, thoracic and lumbar spine were in good range of motion.  There was no SI joint tenderness.  Shoulder joints, elbow joints, wrist joints, MCPs, PIPs and DIPs were in good range of motion with no synovitis.  Hip joints and knee joints were in good range of motion without any warmth swelling or effusion.  There was no tenderness over ankles or MTPs.   CDAI Exam: CDAI Score: -- Patient Global: --; Provider Global: -- Swollen: --; Tender: -- Joint Exam 12/05/2023   No joint exam has been documented for this visit   There is currently no information documented on the homunculus. Go to the Rheumatology activity and complete the homunculus joint exam.  Investigation: No additional findings.  Imaging: No results found.  Recent Labs: Lab Results  Component Value Date   WBC 2.7 (L) 10/01/2023   HGB 11.0 (L) 10/01/2023   PLT 84.0 (L) 10/01/2023   NA 141 10/01/2023   K 4.3 10/01/2023   CL 105 10/01/2023   CO2 29 10/01/2023   GLUCOSE 98 10/01/2023   BUN 9 10/01/2023    CREATININE 0.78 10/01/2023   BILITOT 1.1 10/01/2023   ALKPHOS 57 10/01/2023   AST 19 10/01/2023   ALT 11 10/01/2023   PROT 7.2 10/01/2023   ALBUMIN 4.4 10/01/2023   CALCIUM  9.6 10/01/2023    Speciality Comments: No specialty comments available.  Procedures:  No procedures performed Allergies: Molds & smuts, Sertraline, and Sulfa antibiotics   Assessment / Plan:     Visit Diagnoses: Primary osteoarthritis of both hands -she gives history of pain and stiffness in her hands.  Bilateral CMC PIP and DIP thickening was noted.  No synovitis was noted.  Joint protection muscle strengthening was discussed.  A handout on hand exercises was given.  X-rays obtained in 2024 with suggestive of osteoarthritis.  Paresthesia of both hands-improved.  She has been experiencing some muscle cramps.  Rheumatoid factor positive-no synovitis was noted on the examination.  Chronic pain of both hips -she had good range of motion of bilateral hip joints.  X-rays obtained in 2024 were unremarkable.  Chondromalacia of both patellae-she had good range of motion bilateral knee joints without any warmth swelling or effusion.  Paresthesia of both feet -she had no tenderness over ankles or MTPs.  Diagnosed with peripheral neuropathy.  According to her she twisted her right ankle which is gradually getting better.  Hepatitis C antibody test positive - Treated per patient.  Neck pain -she continues to have neck pain and stiffness.  She is followed by chiropractor.  A handout on neck exercises was given.  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  A handout on upper and lower back exercises was given.  Polyarthralgia - Patient is applying for disability due to chronic pain.  Other cirrhosis of liver (HCC) - Followed by Dr. Margette  Thrombocytopenia-she has neutropenia and  thrombocytopenia.  Patient states this is related to hepatitis C treatment.  Vitamin D  deficiency-she has been taking vitamin D .  Hypothyroidism (acquired)  Lichen planus  BMI 34.0-34.9,adult  Orders: No orders of the defined types were placed in this encounter.  No orders of the defined types were placed in this encounter.    Follow-Up Instructions: Return in about 1 year (around 12/04/2024) for Osteoarthritis.   Maya Nash, MD  Note - This record has been created using Animal nutritionist.  Chart creation errors have been sought, but may not always  have been located. Such creation errors do not reflect on  the standard of medical care.

## 2023-12-05 ENCOUNTER — Ambulatory Visit: Payer: 59 | Attending: Rheumatology | Admitting: Rheumatology

## 2023-12-05 ENCOUNTER — Encounter: Payer: Self-pay | Admitting: Rheumatology

## 2023-12-05 VITALS — BP 120/78 | HR 58 | Temp 97.7°F | Resp 15 | Ht 63.0 in | Wt 189.8 lb

## 2023-12-05 DIAGNOSIS — M19041 Primary osteoarthritis, right hand: Secondary | ICD-10-CM | POA: Diagnosis not present

## 2023-12-05 DIAGNOSIS — D696 Thrombocytopenia, unspecified: Secondary | ICD-10-CM

## 2023-12-05 DIAGNOSIS — R7689 Other specified abnormal immunological findings in serum: Secondary | ICD-10-CM

## 2023-12-05 DIAGNOSIS — R202 Paresthesia of skin: Secondary | ICD-10-CM

## 2023-12-05 DIAGNOSIS — M542 Cervicalgia: Secondary | ICD-10-CM | POA: Diagnosis not present

## 2023-12-05 DIAGNOSIS — M2550 Pain in unspecified joint: Secondary | ICD-10-CM | POA: Diagnosis not present

## 2023-12-05 DIAGNOSIS — E039 Hypothyroidism, unspecified: Secondary | ICD-10-CM

## 2023-12-05 DIAGNOSIS — E559 Vitamin D deficiency, unspecified: Secondary | ICD-10-CM

## 2023-12-05 DIAGNOSIS — Z6834 Body mass index (BMI) 34.0-34.9, adult: Secondary | ICD-10-CM

## 2023-12-05 DIAGNOSIS — M19042 Primary osteoarthritis, left hand: Secondary | ICD-10-CM

## 2023-12-05 DIAGNOSIS — M2241 Chondromalacia patellae, right knee: Secondary | ICD-10-CM

## 2023-12-05 DIAGNOSIS — L439 Lichen planus, unspecified: Secondary | ICD-10-CM

## 2023-12-05 DIAGNOSIS — M51369 Other intervertebral disc degeneration, lumbar region without mention of lumbar back pain or lower extremity pain: Secondary | ICD-10-CM

## 2023-12-05 DIAGNOSIS — M25551 Pain in right hip: Secondary | ICD-10-CM | POA: Diagnosis not present

## 2023-12-05 DIAGNOSIS — M2242 Chondromalacia patellae, left knee: Secondary | ICD-10-CM

## 2023-12-05 DIAGNOSIS — K7469 Other cirrhosis of liver: Secondary | ICD-10-CM | POA: Diagnosis not present

## 2023-12-05 DIAGNOSIS — G8929 Other chronic pain: Secondary | ICD-10-CM

## 2023-12-05 DIAGNOSIS — M25552 Pain in left hip: Secondary | ICD-10-CM

## 2023-12-05 NOTE — Patient Instructions (Signed)
 Hand Exercises Hand exercises can be helpful for almost anyone. They can strengthen your hands and improve flexibility and movement. The exercises can also increase blood flow to the hands. These results can make your work and daily tasks easier for you. Hand exercises can be especially helpful for people who have joint pain from arthritis or nerve damage from using their hands over and over. These exercises can also help people who injure a hand. Exercises Most of these hand exercises are gentle stretching and motion exercises. It is usually safe to do them often throughout the day. Warming up your hands before exercise may help reduce stiffness. You can do this with gentle massage or by placing your hands in warm water for 10-15 minutes. It is normal to feel some stretching, pulling, tightness, or mild discomfort when you begin new exercises. In time, this will improve. Remember to always be careful and stop right away if you feel sudden, very bad pain or your pain gets worse. You want to get better and be safe. Ask your health care provider which exercises are safe for you. Do exercises exactly as told by your provider and adjust them as told. Do not begin these exercises until told by your provider. Knuckle bend or claw fist  Stand or sit with your arm, hand, and all five fingers pointed straight up. Make sure to keep your wrist straight. Gently bend your fingers down toward your palm until the tips of your fingers are touching your palm. Keep your big knuckle straight and only bend the small knuckles in your fingers. Hold this position for 10 seconds. Straighten your fingers back to your starting position. Repeat this exercise 5-10 times with each hand. Full finger fist  Stand or sit with your arm, hand, and all five fingers pointed straight up. Make sure to keep your wrist straight. Gently bend your fingers into your palm until the tips of your fingers are touching the middle of your  palm. Hold this position for 10 seconds. Extend your fingers back to your starting position, stretching every joint fully. Repeat this exercise 5-10 times with each hand. Straight fist  Stand or sit with your arm, hand, and all five fingers pointed straight up. Make sure to keep your wrist straight. Gently bend your fingers at the big knuckle, where your fingers meet your hand, and at the middle knuckle. Keep the knuckle at the tips of your fingers straight and try to touch the bottom of your palm. Hold this position for 10 seconds. Extend your fingers back to your starting position, stretching every joint fully. Repeat this exercise 5-10 times with each hand. Tabletop  Stand or sit with your arm, hand, and all five fingers pointed straight up. Make sure to keep your wrist straight. Gently bend your fingers at the big knuckle, where your fingers meet your hand, as far down as you can. Keep the small knuckles in your fingers straight. Think of forming a tabletop with your fingers. Hold this position for 10 seconds. Extend your fingers back to your starting position, stretching every joint fully. Repeat this exercise 5-10 times with each hand. Finger spread  Place your hand flat on a table with your palm facing down. Make sure your wrist stays straight. Spread your fingers and thumb apart from each other as far as you can until you feel a gentle stretch. Hold this position for 10 seconds. Bring your fingers and thumb tight together again. Hold this position for 10 seconds. Repeat  this exercise 5-10 times with each hand. Making circles  Stand or sit with your arm, hand, and all five fingers pointed straight up. Make sure to keep your wrist straight. Make a circle by touching the tip of your thumb to the tip of your index finger. Hold for 10 seconds. Then open your hand wide. Repeat this motion with your thumb and each of your fingers. Repeat this exercise 5-10 times with each hand. Thumb  motion  Sit with your forearm resting on a table and your wrist straight. Your thumb should be facing up toward the ceiling. Keep your fingers relaxed as you move your thumb. Lift your thumb up as high as you can toward the ceiling. Hold for 10 seconds. Bend your thumb across your palm as far as you can, reaching the tip of your thumb for the small finger (pinkie) side of your palm. Hold for 10 seconds. Repeat this exercise 5-10 times with each hand. Grip strengthening  Hold a stress ball or other soft ball in the middle of your hand. Slowly increase the pressure, squeezing the ball as much as you can without causing pain. Think of bringing the tips of your fingers into the middle of your palm. All of your finger joints should bend when doing this exercise. Hold your squeeze for 10 seconds, then relax. Repeat this exercise 5-10 times with each hand. Contact a health care provider if: Your hand pain or discomfort gets much worse when you do an exercise. Your hand pain or discomfort does not improve within 2 hours after you exercise. If you have either of these problems, stop doing these exercises right away. Do not do them again unless your provider says that you can. Get help right away if: You develop sudden, severe hand pain or swelling. If this happens, stop doing these exercises right away. Do not do them again unless your provider says that you can. This information is not intended to replace advice given to you by your health care provider. Make sure you discuss any questions you have with your health care provider. Document Revised: 02/05/2022 Document Reviewed: 02/05/2022 Elsevier Patient Education  2024 [employer]Thoracic Strain Rehab Ask your health care provider which exercises are safe for you. Do exercises exactly as told by your provider and adjust them as directed. It is normal to feel mild stretching, pulling, tightness, or discomfort as you do these exercises. Stop right  away if you feel sudden pain or your pain gets worse. Do not begin these exercises until told by your provider. Stretching and range-of-motion exercise This exercise warms up your muscles and joints and improves the movement and flexibility of your back and shoulders. This exercise also helps to relieve pain. Chest and spine stretch  Lie down on your back on a firm surface. Roll a towel or a small blanket so it is about 4 inches (10 cm) in diameter. Put the towel under the middle of your back so it is under your spine, but not under your shoulder blades. Put your hands behind your head and let your elbows fall to your sides. This will increase your stretch. Take a deep breath (inhale). Hold for __________ seconds. Relax after you breathe out (exhale). Repeat __________ times. Complete this exercise __________ times a day. Strengthening exercises These exercises build strength and endurance in your back and your shoulder blade muscles. Endurance is the ability to use your muscles for a long time, even after they get tired. Alternating arm and leg  raises  Get on your hands and knees on a firm surface. If you are on a hard floor, you may want to use padding, such as an exercise mat, to cushion your knees. Line up your arms and legs. Your hands should be directly below your shoulders, and your knees should be directly below your hips. Lift your left leg behind you. At the same time, raise your right arm and straighten it in front of you. Do not lift your leg higher than your hip. Do not lift your arm higher than your shoulder. Keep your abdominal and back muscles tight. Keep your hips facing the ground. Do not arch your back. Carefully stay balanced. Do not hold your breath. Hold for __________ seconds. Slowly return to the starting position and repeat with your right leg and your left arm. Repeat __________ times. Complete this exercise __________ times a day. Straight arm rows This  exercise is also called the shoulder extension exercise. Stand with your feet shoulder width apart. Secure an exercise band to a stable object in front of you so the band is at or above shoulder height. Hold one end of the exercise band in each hand. Straighten your elbows and lift your hands up to shoulder height. Step back, away from the secured end of the exercise band, until the band stretches. Squeeze your shoulder blades together and pull your hands down to the sides of your thighs. Stop when your hands are straight down by your sides. This is shoulder extension. Do not let your hands go behind your body. Hold for __________ seconds. Slowly return to the starting position. Repeat __________ times. Complete this exercise __________ times a day. Rowing scapular retraction This is an exercise in which the shoulder blades (scapulae) are pulled toward each other (retraction). Sit in a stable chair without armrests, or stand up. Secure an exercise band to a stable object in front of you so the band is at shoulder height. Hold one end of the exercise band in each hand. Your palms should face toward each other. Bring your arms out straight in front of you. Step back, away from the secured end of the exercise band, until the band stretches. Pull the band backward. As you do this, bend your elbows and squeeze your shoulder blades together, but avoid letting the rest of your body move. Do not shrug your shoulders upward while you do this. Stop when your elbows are at your sides or slightly behind your body. Hold for __________ seconds. Slowly straighten your arms to return to the starting position. Repeat __________ times. Complete this exercise __________ times a day. Posture and body mechanics Good posture and healthy body mechanics can help to relieve stress in your body's tissues and joints. Body mechanics refers to the movements and positions of your body while you do your daily activities.  Posture is part of body mechanics. Good posture means: Your spine is in its natural S-curve position (neutral). Your shoulders are pulled back slightly. Your head is not tipped forward. Follow these guidelines to improve your posture and body mechanics in your everyday activities. Standing  When standing, keep your spine neutral and your feet about hip width apart. Keep a slight bend in your knees. Your ears, shoulders, and hips should line up with each other. When you do a task in which you lean forward while standing in one place for a long time, place one foot up on a stable object that is 2-4 inches (5-10 cm) high,  such as a footstool. This helps keep your spine neutral. Sitting  When sitting, keep your spine neutral and keep your feet flat on the floor. Use a footrest if needed. Keep your thighs parallel to the floor. Avoid rounding your shoulders, and avoid tilting your head forward. When working at a desk or a computer, keep your desk at a height where your hands are slightly lower than your elbows. Slide your chair under your desk so you are close enough to maintain good posture. When working at a computer, place your monitor at a height where you are looking straight ahead and you do not have to tilt your head forward or downward to look at the screen. Resting When lying down and resting, avoid positions that are most painful for you. If you have pain with activities such as sitting, bending, stooping, or squatting (flexion-basedactivities), lie in a position in which your body does not bend very much. For example, avoid curling up on your side with your arms and knees near your chest (fetal position). If you have pain with activities such as standing for a long time or reaching with your arms (extension-basedactivities), lie with your spine in a neutral position and bend your knees slightly. Try the following positions: Lie on your side with a pillow between your knees. Lie on your back  with a pillow under your knees.  Lifting  When lifting objects, keep your feet at least shoulder width apart and tighten your abdominal muscles. Bend your knees and hips and keep your spine neutral. It is important to lift using the strength of your legs, not your back. Do not lock your knees straight out. Always ask for help to lift heavy or awkward objects. This information is not intended to replace advice given to you by your health care provider. Make sure you discuss any questions you have with your health care provider. Document Revised: 07/05/2022 Document Reviewed: 09/10/2021 Elsevier Patient Education  2024 [employer]Cervical Strain and Sprain Rehab Ask your health care provider which exercises are safe for you. Do exercises exactly as told by your health care provider and adjust them as directed. It is normal to feel mild stretching, pulling, tightness, or discomfort as you do these exercises. Stop right away if you feel sudden pain or your pain gets worse. Do not begin these exercises until told by your health care provider. Stretching and range-of-motion exercises Cervical side bending  Using good posture, sit on a stable chair or stand up. Without moving your shoulders, slowly tilt your left / right ear to your shoulder until you feel a stretch in the neck muscles on the opposite side. You should be looking straight ahead. Hold for __________ seconds. Repeat with the other side of your neck. Repeat __________ times. Complete this exercise __________ times a day. Cervical rotation  Using good posture, sit on a stable chair or stand up. Slowly turn your head to the side as if you are looking over your left / right shoulder. Keep your eyes level with the ground. Stop when you feel a stretch along the side and the back of your neck. Hold for __________ seconds. Repeat this by turning to your other side. Repeat __________ times. Complete this exercise __________ times a  day. Thoracic extension and pectoral stretch  Roll a towel or a small blanket so it is about 4 inches (10 cm) in diameter. Lie down on your back on a firm surface. Put the towel in the middle of your  back across your spine. It should not be under your shoulder blades. Put your hands behind your head and let your elbows fall out to your sides. Hold for __________ seconds. Repeat __________ times. Complete this exercise __________ times a day. Strengthening exercises Upper cervical flexion  Lie on your back with a thin pillow behind your head or a small, rolled-up towel under your neck. Gently tuck your chin toward your chest and nod your head down to look toward your feet. Do not lift your head off the pillow. Hold for __________ seconds. Release the tension slowly. Relax your neck muscles completely before you repeat this exercise. Repeat __________ times. Complete this exercise __________ times a day. Cervical extension  Stand about 6 inches (15 cm) away from a wall, with your back facing the wall. Place a soft object, about 6-8 inches (15-20 cm) in diameter, between the back of your head and the wall. A soft object could be a small pillow, a ball, or a folded towel. Gently tilt your head back and press into the soft object. Keep your jaw and forehead relaxed. Hold for __________ seconds. Release the tension slowly. Relax your neck muscles completely before you repeat this exercise. Repeat __________ times. Complete this exercise __________ times a day. Posture and body mechanics Body mechanics refer to the movements and positions of your body while you do your daily activities. Posture is part of body mechanics. Good posture and healthy body mechanics can help to relieve stress in your body's tissues and joints. Good posture means that your spine is in its natural S-curve position (your spine is neutral), your shoulders are pulled back slightly, and your head is not tipped forward. The  following are general guidelines for using improved posture and body mechanics in your everyday activities. Sitting  When sitting, keep your spine neutral and keep your feet flat on the floor. Use a footrest, if needed, and keep your thighs parallel to the floor. Avoid rounding your shoulders. Avoid tilting your head forward. When working at a desk or a computer, keep your desk at a height where your hands are slightly lower than your elbows. Slide your chair under your desk so you are close enough to maintain good posture. When working at a computer, place your monitor at a height where you are looking straight ahead and you do not have to tilt your head forward or downward to look at the screen. Standing  When standing, keep your spine neutral and keep your feet about hip-width apart. Keep a slight bend in your knees. Your ears, shoulders, and hips should line up. When you do a task in which you stand in one place for a long time, place one foot up on a stable object that is 2-4 inches (5-10 cm) high, such as a footstool. This helps keep your spine neutral. Resting When lying down and resting, avoid positions that are most painful for you. Try to support your neck in a neutral position. You can use a contour pillow or a small rolled-up towel. Your pillow should support your neck but not push on it. This information is not intended to replace advice given to you by your health care provider. Make sure you discuss any questions you have with your health care provider. Document Revised: 05/27/2022 Document Reviewed: 08/13/2021 Elsevier Patient Education  2024 [employer].

## 2023-12-08 ENCOUNTER — Encounter: Payer: Self-pay | Admitting: Radiology

## 2023-12-17 ENCOUNTER — Inpatient Hospital Stay

## 2023-12-17 ENCOUNTER — Inpatient Hospital Stay: Admitting: Family

## 2023-12-22 ENCOUNTER — Encounter: Payer: Self-pay | Admitting: Family

## 2023-12-22 ENCOUNTER — Other Ambulatory Visit (HOSPITAL_BASED_OUTPATIENT_CLINIC_OR_DEPARTMENT_OTHER): Payer: Self-pay

## 2023-12-23 ENCOUNTER — Inpatient Hospital Stay: Attending: Hematology & Oncology

## 2023-12-23 ENCOUNTER — Other Ambulatory Visit: Payer: Self-pay

## 2023-12-23 ENCOUNTER — Inpatient Hospital Stay (HOSPITAL_BASED_OUTPATIENT_CLINIC_OR_DEPARTMENT_OTHER): Admitting: Family

## 2023-12-23 ENCOUNTER — Encounter: Payer: Self-pay | Admitting: Family

## 2023-12-23 DIAGNOSIS — D509 Iron deficiency anemia, unspecified: Secondary | ICD-10-CM | POA: Diagnosis not present

## 2023-12-23 LAB — CBC WITH DIFFERENTIAL (CANCER CENTER ONLY)
Abs Immature Granulocytes: 0.02 K/uL (ref 0.00–0.07)
Basophils Absolute: 0 K/uL (ref 0.0–0.1)
Basophils Relative: 1 %
Eosinophils Absolute: 0.1 K/uL (ref 0.0–0.5)
Eosinophils Relative: 2 %
HCT: 36.5 % (ref 36.0–46.0)
Hemoglobin: 11.7 g/dL — ABNORMAL LOW (ref 12.0–15.0)
Immature Granulocytes: 1 %
Lymphocytes Relative: 23 %
Lymphs Abs: 0.9 K/uL (ref 0.7–4.0)
MCH: 28.2 pg (ref 26.0–34.0)
MCHC: 32.1 g/dL (ref 30.0–36.0)
MCV: 88 fL (ref 80.0–100.0)
Monocytes Absolute: 0.2 K/uL (ref 0.1–1.0)
Monocytes Relative: 6 %
Neutro Abs: 2.6 K/uL (ref 1.7–7.7)
Neutrophils Relative %: 67 %
Platelet Count: 86 K/uL — ABNORMAL LOW (ref 150–400)
RBC: 4.15 MIL/uL (ref 3.87–5.11)
RDW: 16.2 % — ABNORMAL HIGH (ref 11.5–15.5)
Smear Review: NORMAL
WBC Count: 3.8 K/uL — ABNORMAL LOW (ref 4.0–10.5)
nRBC: 0 % (ref 0.0–0.2)

## 2023-12-23 LAB — RETICULOCYTES
Immature Retic Fract: 13.6 % (ref 2.3–15.9)
RBC.: 4.19 MIL/uL (ref 3.87–5.11)
Retic Count, Absolute: 70.8 K/uL (ref 19.0–186.0)
Retic Ct Pct: 1.7 % (ref 0.4–3.1)

## 2023-12-23 LAB — IRON AND IRON BINDING CAPACITY (CC-WL,HP ONLY)
Iron: 57 ug/dL (ref 28–170)
Saturation Ratios: 14 % (ref 10.4–31.8)
TIBC: 407 ug/dL (ref 250–450)
UIBC: 350 ug/dL

## 2023-12-23 LAB — FERRITIN: Ferritin: 84 ng/mL (ref 11–307)

## 2023-12-23 NOTE — Progress Notes (Signed)
 Hematology and Oncology Follow Up Visit  Amanda Maynard [id number] 02-18-1960 63 y.o. 12/23/2023   Principle Diagnosis:  Iron  deficiency anemia   Current Therapy:   IV iron  as indicated    Interim History:  Amanda Maynard is here today for follow-up. She states that she tolerated the IV iron  nicely. She is doing well and has a wonderful time in the [place] with her family.  She is starting to notice fatigue again but is unsure if this is due to her dog frequently waking her at night to go outside to pee.  No obvious blood loss noted. No bruising or petechiae.  No fever, chills, n/v, cough, rash, dizziness, SOB, chest pain, palpitations, abdominal pani or changes in bowel or bladder habits.  No swelling, tenderness, numbness or tingling in her extremities at this time.  No falls or syncope reported.  Appetite and hydration are good. Weight is stable at 191 lbs.  ECOG Performance Status: 1 - Symptomatic but completely ambulatory  Medications:  Allergies as of 12/23/2023       Reactions   Molds & Smuts Shortness Of Breath   Sertraline Other (See Comments)   Crazy dreams    Sulfa Antibiotics Itching, Nausea Only, Rash, Nausea And Vomiting        Medication List        Accurate as of December 23, 2023  1:26 PM. If you have any questions, ask your nurse or doctor.          CALTRATE 600+D3 SOFT PO Take by mouth daily at 6 (six) AM.   escitalopram  10 MG tablet Commonly known as: Lexapro  Take 1 tablet (10 mg total) by mouth daily.   ibuprofen  800 MG tablet Commonly known as: ADVIL  Take 1 tablet (800 mg total) by mouth every 8 (eight) hours as needed.   pantoprazole  40 MG tablet Commonly known as: PROTONIX  Take 1 tablet (40 mg total) by mouth as needed for indigestion. What changed: when to take this   rosuvastatin  10 MG tablet Commonly known as: Crestor  Take 1 tablet (10 mg total) by mouth daily.   Synthroid  75 MCG tablet Generic drug: levothyroxine  Take  1 tablet (75 mcg total) by mouth daily before breakfast.   TURMERIC (CURCUMIN) PO Take by mouth daily at 6 (six) AM.   vitamin B-12 250 MCG tablet Commonly known as: CYANOCOBALAMIN  Take 250 mcg by mouth daily.   Vitamin D3 Maximum Strength 125 MCG (5000 UT) capsule Generic drug: Cholecalciferol Take 5,000 Units by mouth daily.        Allergies:  Allergies  Allergen Reactions   Molds & Smuts Shortness Of Breath   Sertraline Other (See Comments)    Crazy dreams    Sulfa Antibiotics Itching, Nausea Only, Rash and Nausea And Vomiting    Past Medical History, Surgical history, Social history, and Family History were reviewed and updated.  Review of Systems: All other 10 point review of systems is negative.   Physical Exam:  vitals were not taken for this visit.   Wt Readings from Last 3 Encounters:  12/05/23 189 lb 12.8 oz (86.1 kg)  10/22/23 193 lb 0.6 oz (87.6 kg)  10/01/23 192 lb (87.1 kg)    Ocular: Sclerae unicteric, pupils equal, round and reactive to light Ear-nose-throat: Oropharynx clear, dentition fair Lymphatic: No cervical or supraclavicular adenopathy Lungs no rales or rhonchi, good excursion bilaterally Heart regular rate and rhythm, no murmur appreciated Abd soft, nontender, positive bowel sounds MSK no focal spinal tenderness, no  joint edema Neuro: non-focal, well-oriented, appropriate affect Breasts: Deferred   Lab Results  Component Value Date   WBC 2.7 (L) 10/01/2023   HGB 11.0 (L) 10/01/2023   HCT 34.3 (L) 10/01/2023   MCV 82.4 10/01/2023   PLT 84.0 (L) 10/01/2023   Lab Results  Component Value Date   FERRITIN 7.3 (L) 10/01/2023   IRON  73 12/13/2020   Lab Results  Component Value Date   RETICCTPCT 1.7 12/23/2023   RBC 4.19 12/23/2023   No results found for: JONATHAN BONG [facility] Lab Results  Component Value Date   IGGSERUM 1,295 02/14/2023   No results found for: STEPHANY CARLOTA BENSON MARKEL EARLA JOANNIE DOC VICK, SPEI   Chemistry      Component Value Date/Time   NA 141 10/01/2023 1105   NA 142 12/13/2020 0000   K 4.3 10/01/2023 1105   CL 105 10/01/2023 1105   CO2 29 10/01/2023 1105   BUN 9 10/01/2023 1105   BUN 11 12/13/2020 0000   CREATININE 0.78 10/01/2023 1105   CREATININE 0.76 04/24/2021 1047   GLU 90 12/13/2020 0000      Component Value Date/Time   CALCIUM  9.6 10/01/2023 1105   ALKPHOS 57 10/01/2023 1105   AST 19 10/01/2023 1105   AST 19 04/24/2021 1047   ALT 11 10/01/2023 1105   ALT 12 04/24/2021 1047   BILITOT 1.1 10/01/2023 1105   BILITOT 0.7 04/27/2021 1523   BILITOT 1.4 (H) 04/24/2021 1047       Impression and Plan: Amanda Maynard is a pleasant 63 yo caucasian female with iron  deficiency anemia.  Iron  studies are pending. We will replace again if needed.  Follow-up in 3 months.   Lauraine Pepper, NP 11/18/20251:26 PM

## 2023-12-24 ENCOUNTER — Encounter: Payer: Self-pay | Admitting: Family

## 2023-12-27 NOTE — Progress Notes (Signed)
 [facility] at [facility] [address] [place], [place] [address] [phone]  Date:  12/29/2023   Name:  Amanda Maynard   DOB:  09-04-1960   MRN:  [id number]  PCP:  Watt Harlene BROCKS, MD    Chief Complaint: No chief complaint on file.   History of Present Illness:  Amanda Maynard is a 63 y.o. very pleasant female patient who presents with the following:  Patient seen today with concern of a small bump over her ear.  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  History of Present Illness     Patient Active Problem List   Diagnosis Date Noted   IDA (iron  deficiency anemia) 10/22/2023   Osteopenia 05/01/2023   Supraventricular tachycardia 12/13/2022   Palpitations 12/13/2022   Chondromalacia of both patellae 12/05/2022   Primary osteoarthritis of both hands 12/05/2022   Degeneration of intervertebral disc of lumbar region without discogenic back pain or lower extremity pain 12/05/2022   Arthritis of finger of left hand 05/10/2022   Bilateral hand pain 05/06/2022   Migraine 04/10/2022   Anxiety and depression 02/04/2022   Chronic lower back pain 12/05/2021   Chest pain 10/10/2021   Hepatitis C antibody test positive 12/13/2020   Hypothyroidism (acquired) 12/13/2020   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   Arthritis 02/05/2020   Sciatica 02/05/2020    Past Medical History:  Diagnosis Date   Allergy    Anxiety    Arthritis    Chicken pox    Depression    Diverticulitis    Emphysema of lung (HCC)    GERD (gastroesophageal reflux disease)     Headache    History of colon polyps    Jaundice    Lichen planopilaris    Thyroid  disease    Urinary incontinence     Past Surgical History:  Procedure Laterality Date   ABDOMINAL HYSTERECTOMY  2012   APPENDECTOMY  2009   COLONOSCOPY  12/02/2016   Dr. Camellia Sinks. Moderate diverticulosis of the ascending colon, transverse colon, descending colon and sigmoid colon, increase in vascularity of the rectum. External hemorrhoids.   COLONOSCOPY  08/09/2013   Dr. Camellia Sinks. Moderate diverticulosis of the hepatic flexure, transverse colon, splenic flexure, descending colon and sigmoid colon. Polyp (7mm) in the proximal sigmoid colon. (polypectomy). External hemorrhoids.   ESOPHAGOGASTRODUODENOSCOPY  08/09/2013   Dr. Camellia Sinks. Normal stomach. Normal duodenum. Grade 1 esophagitis was seen in the lower third of the esophagus.   UPPER GI ENDOSCOPY  04/02/2023    Social History   Tobacco Use   Smoking status: Every Day    Current packs/day: 0.50    Average packs/day: 0.5 packs/day for 30.0 years (15.0 ttl pk-yrs)    Types: Cigarettes    Passive exposure: Past   Tobacco comments:    Have quit twice in 30 years but start again when stress level is too high  Vaping Use   Vaping status: Never Used  Substance Use Topics   Alcohol use: Yes    Alcohol/week: 6.0 standard drinks of alcohol    Types:  3 Cans of beer, 3 Standard drinks or equivalent per week    Comment: occ   Drug use: Never    Family History  Problem Relation Age of Onset   Breast cancer Mother 13   Heart disease Mother    Hearing loss Mother    Depression Mother    Cancer Mother    Arthritis Mother    Cancer Father    Hyperlipidemia Father    Depression Sister    Depression Sister    Heart disease Sister    Heart disease Maternal Grandmother    Cancer Maternal Grandfather    Arthritis Paternal Grandmother    Rheum arthritis Paternal Grandmother    Cancer Paternal Grandfather    Heart attack Paternal Grandfather     Esophageal cancer Neg Hx    Colon cancer Neg Hx    Stomach cancer Neg Hx    Rectal cancer Neg Hx     Allergies  Allergen Reactions   Molds & Smuts Shortness Of Breath   Sertraline Other (See Comments)    Crazy dreams    Sulfa Antibiotics Itching, Nausea Only, Rash and Nausea And Vomiting    Medication list has been reviewed and updated.  Current Outpatient Medications on File Prior to Visit  Medication Sig Dispense Refill   Calcium  Carb-Cholecalciferol (CALTRATE 600+D3 SOFT PO) Take by mouth daily at 6 (six) AM.     Cholecalciferol (VITAMIN D3 MAXIMUM STRENGTH) 125 MCG (5000 UT) capsule Take 5,000 Units by mouth daily.     escitalopram  (LEXAPRO ) 10 MG tablet Take 1 tablet (10 mg total) by mouth daily. 90 tablet 3   ibuprofen  (ADVIL ) 800 MG tablet Take 1 tablet (800 mg total) by mouth every 8 (eight) hours as needed. 30 tablet 1   Misc Natural Products (TURMERIC, CURCUMIN, PO) Take by mouth daily at 6 (six) AM.     pantoprazole  (PROTONIX ) 40 MG tablet Take 1 tablet (40 mg total) by mouth as needed for indigestion. (Patient taking differently: Take 40 mg by mouth daily.) 90 tablet 3   rosuvastatin  (CRESTOR ) 10 MG tablet Take 1 tablet (10 mg total) by mouth daily. 90 tablet 3   SYNTHROID  75 MCG tablet Take 1 tablet (75 mcg total) by mouth daily before breakfast. 90 tablet 1   vitamin B-12 (CYANOCOBALAMIN ) 250 MCG tablet Take 250 mcg by mouth daily.     No current facility-administered medications on file prior to visit.    Review of Systems:  As per HPI- otherwise negative.   Physical Examination: There were no vitals filed for this visit. There were no vitals filed for this visit. There is no height or weight on file to calculate BMI. Ideal Body Weight:    GEN: no acute distress. HEENT: Atraumatic, Normocephalic.  Ears and Nose: No external deformity. CV: RRR, No M/G/R. No JVD. No thrill. No extra heart sounds. PULM: CTA B, no wheezes, crackles, rhonchi. No retractions.  No resp. distress. No accessory muscle use. ABD: S, NT, ND, +BS. No rebound. No HSM. EXTR: No c/c/e PSYCH: Normally interactive. Conversant.    Assessment and Plan: No diagnosis found.  Assessment & Plan   Signed Harlene Schroeder, MD

## 2023-12-29 ENCOUNTER — Encounter: Payer: Self-pay | Admitting: Family Medicine

## 2023-12-29 ENCOUNTER — Ambulatory Visit: Admitting: Family Medicine

## 2023-12-29 VITALS — BP 108/70 | HR 79 | Temp 97.9°F | Ht 63.0 in | Wt 187.4 lb

## 2023-12-29 DIAGNOSIS — Z23 Encounter for immunization: Secondary | ICD-10-CM | POA: Diagnosis not present

## 2023-12-29 DIAGNOSIS — G4452 New daily persistent headache (NDPH): Secondary | ICD-10-CM

## 2023-12-29 NOTE — Patient Instructions (Signed)
 Good to see you today!   Flu shot and pneumonia shot I ordered a CT head for you- please stop by imaging on the ground floor and see if you can get this scheduled Let me know if this strange pain returns!

## 2023-12-31 ENCOUNTER — Telehealth: Payer: Self-pay | Admitting: Family

## 2023-12-31 NOTE — Telephone Encounter (Signed)
 Called to schedule Infusion per inbasket. LVM to return call for scheduling.

## 2024-01-07 ENCOUNTER — Inpatient Hospital Stay: Attending: Hematology & Oncology

## 2024-01-07 VITALS — BP 123/82 | HR 82 | Temp 97.8°F | Resp 18

## 2024-01-07 DIAGNOSIS — G4452 New daily persistent headache (NDPH): Secondary | ICD-10-CM | POA: Insufficient documentation

## 2024-01-07 DIAGNOSIS — D509 Iron deficiency anemia, unspecified: Secondary | ICD-10-CM | POA: Insufficient documentation

## 2024-01-07 MED ORDER — IRON SUCROSE 300 MG IVPB - SIMPLE MED
300.0000 mg | Freq: Once | Status: AC
  Administered 2024-01-07: 300 mg via INTRAVENOUS
  Filled 2024-01-07: qty 300

## 2024-01-07 MED ORDER — SODIUM CHLORIDE 0.9 % IV SOLN: Freq: Once | INTRAVENOUS | Status: AC

## 2024-01-07 NOTE — Patient Instructions (Signed)

## 2024-01-14 ENCOUNTER — Inpatient Hospital Stay

## 2024-01-14 ENCOUNTER — Ambulatory Visit (HOSPITAL_BASED_OUTPATIENT_CLINIC_OR_DEPARTMENT_OTHER)
Admission: RE | Admit: 2024-01-14 | Discharge: 2024-01-14 | Disposition: A | Discharge status: Home / Self Care | Source: Ambulatory Visit | Attending: Family Medicine | Admitting: Family Medicine

## 2024-01-14 VITALS — BP 111/64 | HR 59 | Temp 98.3°F | Resp 18 | Wt 186.0 lb

## 2024-01-14 DIAGNOSIS — G4452 New daily persistent headache (NDPH): Secondary | ICD-10-CM | POA: Insufficient documentation

## 2024-01-14 DIAGNOSIS — D509 Iron deficiency anemia, unspecified: Secondary | ICD-10-CM | POA: Diagnosis not present

## 2024-01-14 DIAGNOSIS — R519 Headache, unspecified: Secondary | ICD-10-CM | POA: Diagnosis not present

## 2024-01-14 MED ORDER — SODIUM CHLORIDE 0.9% FLUSH: 10.0000 mL | Freq: Once | INTRAVENOUS | Status: DC | PRN

## 2024-01-14 MED ORDER — SODIUM CHLORIDE 0.9 % IV SOLN: INTRAVENOUS | Status: DC

## 2024-01-14 MED ORDER — IRON SUCROSE 300 MG IVPB - SIMPLE MED
300.0000 mg | Freq: Once | Status: AC
  Administered 2024-01-14: 300 mg via INTRAVENOUS
  Filled 2024-01-14: qty 300

## 2024-01-14 NOTE — Patient Instructions (Signed)

## 2024-01-19 ENCOUNTER — Encounter: Payer: Self-pay | Admitting: Family Medicine

## 2024-01-26 ENCOUNTER — Other Ambulatory Visit (HOSPITAL_BASED_OUTPATIENT_CLINIC_OR_DEPARTMENT_OTHER): Payer: Self-pay

## 2024-01-26 ENCOUNTER — Encounter: Payer: Self-pay | Admitting: Family

## 2024-02-09 ENCOUNTER — Other Ambulatory Visit (HOSPITAL_BASED_OUTPATIENT_CLINIC_OR_DEPARTMENT_OTHER): Payer: Self-pay

## 2024-02-19 ENCOUNTER — Other Ambulatory Visit (HOSPITAL_BASED_OUTPATIENT_CLINIC_OR_DEPARTMENT_OTHER): Payer: Self-pay

## 2024-02-19 ENCOUNTER — Encounter: Payer: Self-pay | Admitting: Family

## 2024-02-20 ENCOUNTER — Other Ambulatory Visit (HOSPITAL_BASED_OUTPATIENT_CLINIC_OR_DEPARTMENT_OTHER): Payer: Self-pay

## 2024-02-20 ENCOUNTER — Encounter: Payer: Self-pay | Admitting: Family

## 2024-02-23 ENCOUNTER — Encounter: Payer: Self-pay | Admitting: Family

## 2024-02-23 ENCOUNTER — Other Ambulatory Visit (HOSPITAL_BASED_OUTPATIENT_CLINIC_OR_DEPARTMENT_OTHER): Payer: Self-pay

## 2024-03-24 ENCOUNTER — Inpatient Hospital Stay: Admitting: Family

## 2024-03-24 ENCOUNTER — Inpatient Hospital Stay

## 2024-12-03 ENCOUNTER — Ambulatory Visit: Admitting: Rheumatology
